# Patient Record
Sex: Female | Born: 1985 | Race: Black or African American | Hispanic: No | Marital: Single | Smoking: Never smoker
Health system: Southern US, Community
[De-identification: ages and names within clinical notes are randomized; demographics above are authoritative.]

## PROBLEM LIST (undated history)

## (undated) ENCOUNTER — Inpatient Hospital Stay (HOSPITAL_COMMUNITY): Payer: Self-pay

## (undated) DIAGNOSIS — N200 Calculus of kidney: Secondary | ICD-10-CM

## (undated) DIAGNOSIS — O09299 Supervision of pregnancy with other poor reproductive or obstetric history, unspecified trimester: Secondary | ICD-10-CM

## (undated) DIAGNOSIS — Z8759 Personal history of other complications of pregnancy, childbirth and the puerperium: Secondary | ICD-10-CM

## (undated) DIAGNOSIS — R51 Headache: Secondary | ICD-10-CM

## (undated) DIAGNOSIS — B999 Unspecified infectious disease: Secondary | ICD-10-CM

## (undated) DIAGNOSIS — R519 Headache, unspecified: Secondary | ICD-10-CM

## (undated) HISTORY — PX: TUBAL LIGATION: SHX77

## (undated) HISTORY — PX: NO PAST SURGERIES: SHX2092

---
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
## 2017-09-20 ENCOUNTER — Inpatient Hospital Stay (HOSPITAL_COMMUNITY)
Admission: AD | Admit: 2017-09-20 | Discharge: 2017-09-20 | Disposition: A | Discharge status: Home / Self Care | Payer: BC Managed Care – PPO | Source: Ambulatory Visit | Attending: Obstetrics & Gynecology | Admitting: Obstetrics & Gynecology

## 2017-09-20 DIAGNOSIS — R109 Unspecified abdominal pain: Secondary | ICD-10-CM | POA: Insufficient documentation

## 2017-09-20 DIAGNOSIS — N946 Dysmenorrhea, unspecified: Secondary | ICD-10-CM | POA: Diagnosis not present

## 2017-09-20 DIAGNOSIS — N939 Abnormal uterine and vaginal bleeding, unspecified: Secondary | ICD-10-CM | POA: Insufficient documentation

## 2017-09-20 LAB — COMPREHENSIVE METABOLIC PANEL
ALT: 17 U/L (ref 14–54)
ANION GAP: 9 (ref 5–15)
AST: 19 U/L (ref 15–41)
Albumin: 4.1 g/dL (ref 3.5–5.0)
Alkaline Phosphatase: 68 U/L (ref 38–126)
BUN: 8 mg/dL (ref 6–20)
CHLORIDE: 106 mmol/L (ref 101–111)
CO2: 22 mmol/L (ref 22–32)
Calcium: 9 mg/dL (ref 8.9–10.3)
Creatinine, Ser: 0.74 mg/dL (ref 0.44–1.00)
Glucose, Bld: 89 mg/dL (ref 65–99)
POTASSIUM: 4 mmol/L (ref 3.5–5.1)
Sodium: 137 mmol/L (ref 135–145)
Total Bilirubin: 0.5 mg/dL (ref 0.3–1.2)
Total Protein: 7.4 g/dL (ref 6.5–8.1)

## 2017-09-20 LAB — CBC
HCT: 37.3 % (ref 36.0–46.0)
Hemoglobin: 12.7 g/dL (ref 12.0–15.0)
MCH: 31.2 pg (ref 26.0–34.0)
MCHC: 34 g/dL (ref 30.0–36.0)
MCV: 91.6 fL (ref 78.0–100.0)
PLATELETS: 292 10*3/uL (ref 150–400)
RBC: 4.07 MIL/uL (ref 3.87–5.11)
RDW: 13.1 % (ref 11.5–15.5)
WBC: 5.3 10*3/uL (ref 4.0–10.5)

## 2017-09-20 LAB — WET PREP, GENITAL
CLUE CELLS WET PREP: NONE SEEN
Sperm: NONE SEEN
Trich, Wet Prep: NONE SEEN
YEAST WET PREP: NONE SEEN

## 2017-09-20 LAB — URINALYSIS, ROUTINE W REFLEX MICROSCOPIC
BACTERIA UA: NONE SEEN
BILIRUBIN URINE: NEGATIVE
GLUCOSE, UA: NEGATIVE mg/dL
KETONES UR: NEGATIVE mg/dL
NITRITE: NEGATIVE
Protein, ur: NEGATIVE mg/dL
RBC / HPF: >50 RBC/hpf — ABNORMAL HIGH (ref 0–5)
Specific Gravity, Urine: 1.012 (ref 1.005–1.030)
WBC, UA: >50 WBC/hpf — ABNORMAL HIGH (ref 0–5)
pH: 6 (ref 5.0–8.0)

## 2017-09-20 LAB — LIPASE, BLOOD: LIPASE: 25 U/L (ref 11–51)

## 2017-09-20 LAB — POCT PREGNANCY, URINE: Preg Test, Ur: NEGATIVE

## 2017-09-20 MED ORDER — KETOROLAC TROMETHAMINE 60 MG/2ML IM SOLN
60.0000 mg | Freq: Once | INTRAMUSCULAR | Status: DC
  Filled 2017-09-20: qty 2

## 2017-09-20 NOTE — MAU Provider Note (Signed)
Chief Complaint: Vaginal Bleeding and Abdominal Pain   None     SUBJECTIVE HPI: Valerie Taylor is a 32 y.o. nonpregnant female who presents to maternity admissions reporting sudden onset of cramping pain yesterday, the day her period started, which resolved, then onset of pain today again, mostly on the left lower side of her abdomen. She has not tried any treatments. She just finished course of Flagyl for BV. There are no other symptoms. She denies constipation, n/v, or fever/chills.     HPI  No past medical history on file.  The histories are not reviewed yet. Please review them in the "History" navigator section and refresh this SmartLink. Social History   Socioeconomic History  . Marital status: Single    Spouse name: Not on file  . Number of children: Not on file  . Years of education: Not on file  . Highest education level: Not on file  Occupational History  . Not on file  Social Needs  . Financial resource strain: Not on file  . Food insecurity:    Worry: Not on file    Inability: Not on file  . Transportation needs:    Medical: Not on file    Non-medical: Not on file  Tobacco Use  . Smoking status: Not on file  Substance and Sexual Activity  . Alcohol use: Not on file  . Drug use: Not on file  . Sexual activity: Not on file  Lifestyle  . Physical activity:    Days per week: Not on file    Minutes per session: Not on file  . Stress: Not on file  Relationships  . Social connections:    Talks on phone: Not on file    Gets together: Not on file    Attends religious service: Not on file    Active member of club or organization: Not on file    Attends meetings of clubs or organizations: Not on file    Relationship status: Not on file  . Intimate partner violence:    Fear of current or ex partner: Not on file    Emotionally abused: Not on file    Physically abused: Not on file    Forced sexual activity: Not on file  Other Topics Concern  . Not on file   Social History Narrative  . Not on file   No current facility-administered medications on file prior to encounter.    No current outpatient medications on file prior to encounter.   Allergies not on file  ROS:  Review of Systems  Constitutional: Negative for chills, fatigue and fever.  Respiratory: Negative for shortness of breath.   Cardiovascular: Negative for chest pain.  Gastrointestinal: Positive for abdominal pain. Negative for nausea and vomiting.  Genitourinary: Positive for pelvic pain and vaginal bleeding. Negative for difficulty urinating, dysuria, flank pain, vaginal discharge and vaginal pain.  Neurological: Negative for dizziness and headaches.  Psychiatric/Behavioral: Negative.      I have reviewed patient's Past Medical Hx, Surgical Hx, Family Hx, Social Hx, medications and allergies.   Physical Exam   Patient Vitals for the past 24 hrs:  BP Temp Temp src Pulse Resp  09/20/17 1208 - 98.6 F (37 C) Oral - 17  09/20/17 1206 108/72 - - 87 -   Constitutional: Well-developed, well-nourished female in no acute distress.  Cardiovascular: normal rate Respiratory: normal effort GI: Abd soft, non-tender. Pos BS x 4 MS: Extremities nontender, no edema, normal ROM Neurologic: Alert and oriented x 4.  GU: Neg CVAT.  PELVIC EXAM: Cervix pink, visually closed, without lesion, small amount dark red bleeding, vaginal walls and external genitalia normal Bimanual exam: Cervix 0/long/high, firm, anterior, neg CMT, uterus nontender, nonenlarged, adnexa without tenderness, enlargement, or mass   LAB RESULTS Results for orders placed or performed during the hospital encounter of 09/20/17 (from the past 24 hour(s))  Urinalysis, Routine w reflex microscopic     Status: Abnormal   Collection Time: 09/20/17 12:20 PM  Result Value Ref Range   Color, Urine YELLOW YELLOW   APPearance CLEAR CLEAR   Specific Gravity, Urine 1.012 1.005 - 1.030   pH 6.0 5.0 - 8.0   Glucose, UA  NEGATIVE NEGATIVE mg/dL   Hgb urine dipstick LARGE (A) NEGATIVE   Bilirubin Urine NEGATIVE NEGATIVE   Ketones, ur NEGATIVE NEGATIVE mg/dL   Protein, ur NEGATIVE NEGATIVE mg/dL   Nitrite NEGATIVE NEGATIVE   Leukocytes, UA TRACE (A) NEGATIVE   RBC / HPF >50 (H) 0 - 5 RBC/hpf   WBC, UA >50 (H) 0 - 5 WBC/hpf   Bacteria, UA NONE SEEN NONE SEEN   Squamous Epithelial / LPF 6-10 0 - 5  Pregnancy, urine POC     Status: None   Collection Time: 09/20/17 12:24 PM  Result Value Ref Range   Preg Test, Ur NEGATIVE NEGATIVE  CBC     Status: None   Collection Time: 09/20/17 12:43 PM  Result Value Ref Range   WBC 5.3 4.0 - 10.5 K/uL   RBC 4.07 3.87 - 5.11 MIL/uL   Hemoglobin 12.7 12.0 - 15.0 g/dL   HCT 40.9 81.1 - 91.4 %   MCV 91.6 78.0 - 100.0 fL   MCH 31.2 26.0 - 34.0 pg   MCHC 34.0 30.0 - 36.0 g/dL   RDW 78.2 95.6 - 21.3 %   Platelets 292 150 - 400 K/uL  Comprehensive metabolic panel     Status: None   Collection Time: 09/20/17 12:43 PM  Result Value Ref Range   Sodium 137 135 - 145 mmol/L   Potassium 4.0 3.5 - 5.1 mmol/L   Chloride 106 101 - 111 mmol/L   CO2 22 22 - 32 mmol/L   Glucose, Bld 89 65 - 99 mg/dL   BUN 8 6 - 20 mg/dL   Creatinine, Ser 0.86 0.44 - 1.00 mg/dL   Calcium 9.0 8.9 - 57.8 mg/dL   Total Protein 7.4 6.5 - 8.1 g/dL   Albumin 4.1 3.5 - 5.0 g/dL   AST 19 15 - 41 U/L   ALT 17 14 - 54 U/L   Alkaline Phosphatase 68 38 - 126 U/L   Total Bilirubin 0.5 0.3 - 1.2 mg/dL   GFR calc non Af Amer >60 >60 mL/min   GFR calc Af Amer >60 >60 mL/min   Anion gap 9 5 - 15  Lipase, blood     Status: None   Collection Time: 09/20/17 12:43 PM  Result Value Ref Range   Lipase 25 11 - 51 U/L  Wet prep, genital     Status: Abnormal   Collection Time: 09/20/17 12:54 PM  Result Value Ref Range   Yeast Wet Prep HPF POC NONE SEEN NONE SEEN   Trich, Wet Prep NONE SEEN NONE SEEN   Clue Cells Wet Prep HPF POC NONE SEEN NONE SEEN   WBC, Wet Prep HPF POC FEW (A) NONE SEEN   Sperm NONE SEEN         IMAGING No results found.  MAU Management/MDM:  CBC, CMP, lipase wnl UA wnl except Hgb, likely from vaginal bleeding. Pelvic exam with pain at upper portion of uterus, none in ovaries. Pain is likely menstrual in nature. No palpable masses today but cannot rule out uterine fibroid.  Pt to f/u with OB/Gyn and given list of providers today.  Toradol 60 mg IM x 1 dose given.  Pt discharged with strict return precautions.  ASSESSMENT 1. Dysmenorrhea     PLAN Discharge home  Allergies as of 09/20/2017   Not on File     Medication List    You have not been prescribed any medications.    Follow-up Information    OB/Gyn of your choice Follow up.   Why:  See list provided          Sharen CounterLisa Leftwich-Kirby Certified Nurse-Midwife 09/20/2017  1:20 PM

## 2017-09-20 NOTE — Discharge Instructions (Signed)
[facility]  ° ° °[facility]       Phone: [phone] ° °[facility] Phone: [phone] ° °[facility]  Phone: [phone] ° °[facility]  Phone: [phone] ° °[facility]  Phone: [phone] ° °[facility]       Phone: [phone] ° °[facility]    Phone: [phone]  ° °[facility] ()    Phone: [phone] ° °[facility]    Phone: [phone] ° °[facility]    Phone: [phone] ° °[facility]    Phone: [phone] ° °[facility]       Phone: [phone]  ° °[facility]  Phone: [phone] ° °[facility]    Phone: [phone] ° °[facility]   Phone: [phone] ° °[facility]      Phone: [phone] ° °[facility]    Phone: [phone] ° °

## 2017-09-20 NOTE — MAU Note (Signed)
Pt c/o increased pain and bleeding with period.  Started on Tuesday and is now worse.

## 2017-12-06 ENCOUNTER — Ambulatory Visit (INDEPENDENT_AMBULATORY_CARE_PROVIDER_SITE_OTHER): Payer: BC Managed Care – PPO | Admitting: Obstetrics and Gynecology

## 2017-12-06 ENCOUNTER — Encounter: Payer: Self-pay | Admitting: Obstetrics and Gynecology

## 2017-12-06 VITALS — BP 114/78 | HR 105 | Ht 64.0 in | Wt 184.0 lb

## 2017-12-06 DIAGNOSIS — N898 Other specified noninflammatory disorders of vagina: Secondary | ICD-10-CM | POA: Diagnosis not present

## 2017-12-06 DIAGNOSIS — Z3490 Encounter for supervision of normal pregnancy, unspecified, unspecified trimester: Secondary | ICD-10-CM | POA: Diagnosis not present

## 2017-12-06 DIAGNOSIS — Z3491 Encounter for supervision of normal pregnancy, unspecified, first trimester: Secondary | ICD-10-CM

## 2017-12-06 DIAGNOSIS — Z113 Encounter for screening for infections with a predominantly sexual mode of transmission: Secondary | ICD-10-CM | POA: Diagnosis not present

## 2017-12-06 DIAGNOSIS — O09299 Supervision of pregnancy with other poor reproductive or obstetric history, unspecified trimester: Secondary | ICD-10-CM

## 2017-12-06 DIAGNOSIS — Z8759 Personal history of other complications of pregnancy, childbirth and the puerperium: Secondary | ICD-10-CM

## 2017-12-06 DIAGNOSIS — O09291 Supervision of pregnancy with other poor reproductive or obstetric history, first trimester: Secondary | ICD-10-CM

## 2017-12-06 HISTORY — DX: Supervision of pregnancy with other poor reproductive or obstetric history, unspecified trimester: O09.299

## 2017-12-06 HISTORY — DX: Personal history of other complications of pregnancy, childbirth and the puerperium: Z87.59

## 2017-12-06 MED ORDER — VITAFOL GUMMIES 3.33-0.333-34.8 MG PO CHEW: 2.0000 | CHEWABLE_TABLET | Freq: Every day | ORAL | 6 refills | Status: AC

## 2017-12-06 NOTE — Progress Notes (Signed)
Pt states she had a ectopic pregnancy in 2004   Pt also noted she delivered at 4 months need to confirm year. Per pt was in [place]  Last pap: per pt in the beginning of the year was normal was done in [place], [place]   C/O: cramping pain. Declines Genetic screening.

## 2017-12-06 NOTE — Progress Notes (Signed)
  Subjective:    Valerie Taylor is a G3P0010 [redacted]w[redacted]d being seen today for her first obstetrical visit.  Her obstetrical history is significant for previous pregnancy loss at 84 months gestational age. Patient is uncertain if she received the diagnosis of incompetent cervix what caused the loss- records requested. Patient does intend to breast feed. Pregnancy history fully reviewed.  Patient reports no complaints.  Vitals:   12/06/17 1421 12/06/17 1426  BP: 114/78   Pulse: (!) 105   Weight: 184 lb (83.5 kg)   Height:  5\' 4"  (1.626 m)    HISTORY: OB History  Gravida Para Term Preterm AB Living  3       1    SAB TAB Ectopic Multiple Live Births      1        # Outcome Date GA Lbr Len/2nd Weight Sex Delivery Anes PTL Lv  3 Current           2 Ectopic           1 Gravida             Obstetric Comments  2004 per pt    History reviewed. No pertinent past medical history. History reviewed. No pertinent surgical history. History reviewed. No pertinent family history.   Exam    Uterus:     Pelvic Exam:    Perineum: No Hemorrhoids, Normal Perineum   Vulva: normal   Vagina:  normal mucosa, normal discharge   pH:    Cervix: multiparous appearance and cervix: closed, thick and long   Adnexa: normal adnexa and no mass, fullness, tenderness   Bony Pelvis: gynecoid  System: Breast:  normal appearance, no masses or tenderness   Skin: normal coloration and turgor, no rashes    Neurologic: oriented, no focal deficits   Extremities: normal strength, tone, and muscle mass   HEENT extra ocular movement intact   Mouth/Teeth mucous membranes moist, pharynx normal without lesions and dental hygiene good   Neck supple and no masses   Cardiovascular: regular rate and rhythm   Respiratory:  chest clear, no wheezing, crepitations, rhonchi, normal symmetric air entry   Abdomen: soft, non-tender; bowel sounds normal; no masses,  no organomegaly   Urinary:       Assessment:    Pregnancy:  G3P0010 Patient Active Problem List   Diagnosis Date Noted  . Supervision of normal pregnancy 12/06/2017  . History of ectopic pregnancy 12/06/2017  . History of pregnancy loss in prior pregnancy, currently pregnant 12/06/2017        Plan:     Initial labs drawn. Prenatal vitamins. Problem list reviewed and updated. Genetic Screening discussed : declined.  Ultrasound discussed; fetal survey: ordered. Patient is considering water birth Patient reports pap smear within the past 12 months in [place]- records requested Records from pregnancy loss requested  Follow up in 4 weeks. 50% of 30 min visit spent on counseling and coordination of care.     Jozeph Persing 12/06/2017

## 2017-12-06 NOTE — Patient Instructions (Signed)
 First Trimester of Pregnancy The first trimester of pregnancy is from week 1 until the end of week 13 (months 1 through 3). A week after a sperm fertilizes an egg, the egg will implant on the wall of the uterus. This embryo will begin to develop into a baby. Genes from you and your partner will form the baby. The female genes will determine whether the baby will be a boy or a girl. At 6-8 weeks, the eyes and face will be formed, and the heartbeat can be seen on ultrasound. At the end of 12 weeks, all the baby's organs will be formed. Now that you are pregnant, you will want to do everything you can to have a healthy baby. Two of the most important things are to get good prenatal care and to follow your health care provider's instructions. Prenatal care is all the medical care you receive before the baby's birth. This care will help prevent, find, and treat any problems during the pregnancy and childbirth. Body changes during your first trimester Your body goes through many changes during pregnancy. The changes vary from woman to woman.  You may gain or lose a couple of pounds at first.  You may feel sick to your stomach (nauseous) and you may throw up (vomit). If the vomiting is uncontrollable, call your health care provider.  You may tire easily.  You may develop headaches that can be relieved by medicines. All medicines should be approved by your health care provider.  You may urinate more often. Painful urination may mean you have a bladder infection.  You may develop heartburn as a result of your pregnancy.  You may develop constipation because certain hormones are causing the muscles that push stool through your intestines to slow down.  You may develop hemorrhoids or swollen veins (varicose veins).  Your breasts may begin to grow larger and become tender. Your nipples may stick out more, and the tissue that surrounds them (areola) may become darker.  Your gums may bleed and may be  sensitive to brushing and flossing.  Dark spots or blotches (chloasma, mask of pregnancy) may develop on your face. This will likely fade after the baby is born.  Your menstrual periods will stop.  You may have a loss of appetite.  You may develop cravings for certain kinds of food.  You may have changes in your emotions from day to day, such as being excited to be pregnant or being concerned that something may go wrong with the pregnancy and baby.  You may have more vivid and strange dreams.  You may have changes in your hair. These can include thickening of your hair, rapid growth, and changes in texture. Some women also have hair loss during or after pregnancy, or hair that feels dry or thin. Your hair will most likely return to normal after your baby is born.  What to expect at prenatal visits During a routine prenatal visit:  You will be weighed to make sure you and the baby are growing normally.  Your blood pressure will be taken.  Your abdomen will be measured to track your baby's growth.  The fetal heartbeat will be listened to between weeks 10 and 14 of your pregnancy.  Test results from any previous visits will be discussed.  Your health care provider may ask you:  How you are feeling.  If you are feeling the baby move.  If you have had any abnormal symptoms, such as leaking fluid, bleeding, severe   headaches, or abdominal cramping.  If you are using any tobacco products, including cigarettes, chewing tobacco, and electronic cigarettes.  If you have any questions.  Other tests that may be performed during your first trimester include:  Blood tests to find your blood type and to check for the presence of any previous infections. The tests will also be used to check for low iron levels (anemia) and protein on red blood cells (Rh antibodies). Depending on your risk factors, or if you previously had diabetes during pregnancy, you may have tests to check for high blood  sugar that affects pregnant women (gestational diabetes).  Urine tests to check for infections, diabetes, or protein in the urine.  An ultrasound to confirm the proper growth and development of the baby.  Fetal screens for spinal cord problems (spina bifida) and Down syndrome.  HIV (human immunodeficiency virus) testing. Routine prenatal testing includes screening for HIV, unless you choose not to have this test.  You may need other tests to make sure you and the baby are doing well.  Follow these instructions at home: Medicines  Follow your health care provider's instructions regarding medicine use. Specific medicines may be either safe or unsafe to take during pregnancy.  Take a prenatal vitamin that contains at least 600 micrograms (mcg) of folic acid.  If you develop constipation, try taking a stool softener if your health care provider approves. Eating and drinking  Eat a balanced diet that includes fresh fruits and vegetables, whole grains, good sources of protein such as meat, eggs, or tofu, and low-fat dairy. Your health care provider will help you determine the amount of weight gain that is right for you.  Avoid raw meat and uncooked cheese. These carry germs that can cause birth defects in the baby.  Eating four or five small meals rather than three large meals a day may help relieve nausea and vomiting. If you start to feel nauseous, eating a few soda crackers can be helpful. Drinking liquids between meals, instead of during meals, also seems to help ease nausea and vomiting.  Limit foods that are high in fat and processed sugars, such as fried and sweet foods.  To prevent constipation: ? Eat foods that are high in fiber, such as fresh fruits and vegetables, whole grains, and beans. ? Drink enough fluid to keep your urine clear or pale yellow. Activity  Exercise only as directed by your health care provider. Most women can continue their usual exercise routine during  pregnancy. Try to exercise for 30 minutes at least 5 days a week. Exercising will help you: ? Control your weight. ? Stay in shape. ? Be prepared for labor and delivery.  Experiencing pain or cramping in the lower abdomen or lower back is a good sign that you should stop exercising. Check with your health care provider before continuing with normal exercises.  Try to avoid standing for long periods of time. Move your legs often if you must stand in one place for a long time.  Avoid heavy lifting.  Wear low-heeled shoes and practice good posture.  You may continue to have sex unless your health care provider tells you not to. Relieving pain and discomfort  Wear a good support bra to relieve breast tenderness.  Take warm sitz baths to soothe any pain or discomfort caused by hemorrhoids. Use hemorrhoid cream if your health care provider approves.  Rest with your legs elevated if you have leg cramps or low back pain.  If you   develop varicose veins in your legs, wear support hose. Elevate your feet for 15 minutes, 3-4 times a day. Limit salt in your diet. Prenatal care  Schedule your prenatal visits by the twelfth week of pregnancy. They are usually scheduled monthly at first, then more often in the last 2 months before delivery.  Write down your questions. Take them to your prenatal visits.  Keep all your prenatal visits as told by your health care provider. This is important. Safety  Wear your seat belt at all times when driving.  Make a list of emergency phone numbers, including numbers for family, friends, the hospital, and police and fire departments. General instructions  Ask your health care provider for a referral to a local prenatal education class. Begin classes no later than the beginning of month 6 of your pregnancy.  Ask for help if you have counseling or nutritional needs during pregnancy. Your health care provider can offer advice or refer you to specialists for help  with various needs.  Do not use hot tubs, steam rooms, or saunas.  Do not douche or use tampons or scented sanitary pads.  Do not cross your legs for long periods of time.  Avoid cat litter boxes and soil used by cats. These carry germs that can cause birth defects in the baby and possibly loss of the fetus by miscarriage or stillbirth.  Avoid all smoking, herbs, alcohol, and medicines not prescribed by your health care provider. Chemicals in these products affect the formation and growth of the baby.  Do not use any products that contain nicotine or tobacco, such as cigarettes and e-cigarettes. If you need help quitting, ask your health care provider. You may receive counseling support and other resources to help you quit.  Schedule a dentist appointment. At home, brush your teeth with a soft toothbrush and be gentle when you floss. Contact a health care provider if:  You have dizziness.  You have mild pelvic cramps, pelvic pressure, or nagging pain in the abdominal area.  You have persistent nausea, vomiting, or diarrhea.  You have a bad smelling vaginal discharge.  You have pain when you urinate.  You notice increased swelling in your face, hands, legs, or ankles.  You are exposed to fifth disease or chickenpox.  You are exposed to German measles (rubella) and have never had it. Get help right away if:  You have a fever.  You are leaking fluid from your vagina.  You have spotting or bleeding from your vagina.  You have severe abdominal cramping or pain.  You have rapid weight gain or loss.  You vomit blood or material that looks like coffee grounds.  You develop a severe headache.  You have shortness of breath.  You have any kind of trauma, such as from a fall or a car accident. Summary  The first trimester of pregnancy is from week 1 until the end of week 13 (months 1 through 3).  Your body goes through many changes during pregnancy. The changes vary from  woman to woman.  You will have routine prenatal visits. During those visits, your health care provider will examine you, discuss any test results you may have, and talk with you about how you are feeling. This information is not intended to replace advice given to you by your health care provider. Make sure you discuss any questions you have with your health care provider. Document Released: 03/14/2001 Document Revised: 03/01/2016 Document Reviewed: 03/01/2016 [employer] Interactive Patient Education  2018   [employer]   Second Trimester of Pregnancy The second trimester is from week 14 through week 27 (months 4 through 6). The second trimester is often a time when you feel your best. Your body has adjusted to being pregnant, and you begin to feel better physically. Usually, morning sickness has lessened or quit completely, you may have more energy, and you may have an increase in appetite. The second trimester is also a time when the fetus is growing rapidly. At the end of the sixth month, the fetus is about 9 inches long and weighs about 1 pounds. You will likely begin to feel the baby move (quickening) between 16 and 20 weeks of pregnancy. Body changes during your second trimester Your body continues to go through many changes during your second trimester. The changes vary from woman to woman.  Your weight will continue to increase. You will notice your lower abdomen bulging out.  You may begin to get stretch marks on your hips, abdomen, and breasts.  You may develop headaches that can be relieved by medicines. The medicines should be approved by your health care provider.  You may urinate more often because the fetus is pressing on your bladder.  You may develop or continue to have heartburn as a result of your pregnancy.  You may develop constipation because certain hormones are causing the muscles that push waste through your intestines to slow down.  You may develop hemorrhoids or  swollen, bulging veins (varicose veins).  You may have back pain. This is caused by: ? Weight gain. ? Pregnancy hormones that are relaxing the joints in your pelvis. ? A shift in weight and the muscles that support your balance.  Your breasts will continue to grow and they will continue to become tender.  Your gums may bleed and may be sensitive to brushing and flossing.  Dark spots or blotches (chloasma, mask of pregnancy) may develop on your face. This will likely fade after the baby is born.  A dark line from your belly button to the pubic area (linea nigra) may appear. This will likely fade after the baby is born.  You may have changes in your hair. These can include thickening of your hair, rapid growth, and changes in texture. Some women also have hair loss during or after pregnancy, or hair that feels dry or thin. Your hair will most likely return to normal after your baby is born.  What to expect at prenatal visits During a routine prenatal visit:  You will be weighed to make sure you and the fetus are growing normally.  Your blood pressure will be taken.  Your abdomen will be measured to track your baby's growth.  The fetal heartbeat will be listened to.  Any test results from the previous visit will be discussed.  Your health care provider may ask you:  How you are feeling.  If you are feeling the baby move.  If you have had any abnormal symptoms, such as leaking fluid, bleeding, severe headaches, or abdominal cramping.  If you are using any tobacco products, including cigarettes, chewing tobacco, and electronic cigarettes.  If you have any questions.  Other tests that may be performed during your second trimester include:  Blood tests that check for: ? Low iron levels (anemia). ? High blood sugar that affects pregnant women (gestational diabetes) between 24 and 28 weeks. ? Rh antibodies. This is to check for a protein on red blood cells (Rh factor).  Urine  tests to   check for infections, diabetes, or protein in the urine.  An ultrasound to confirm the proper growth and development of the baby.  An amniocentesis to check for possible genetic problems.  Fetal screens for spina bifida and Down syndrome.  HIV (human immunodeficiency virus) testing. Routine prenatal testing includes screening for HIV, unless you choose not to have this test.  Follow these instructions at home: Medicines  Follow your health care provider's instructions regarding medicine use. Specific medicines may be either safe or unsafe to take during pregnancy.  Take a prenatal vitamin that contains at least 600 micrograms (mcg) of folic acid.  If you develop constipation, try taking a stool softener if your health care provider approves. Eating and drinking  Eat a balanced diet that includes fresh fruits and vegetables, whole grains, good sources of protein such as meat, eggs, or tofu, and low-fat dairy. Your health care provider will help you determine the amount of weight gain that is right for you.  Avoid raw meat and uncooked cheese. These carry germs that can cause birth defects in the baby.  If you have low calcium intake from food, talk to your health care provider about whether you should take a daily calcium supplement.  Limit foods that are high in fat and processed sugars, such as fried and sweet foods.  To prevent constipation: ? Drink enough fluid to keep your urine clear or pale yellow. ? Eat foods that are high in fiber, such as fresh fruits and vegetables, whole grains, and beans. Activity  Exercise only as directed by your health care provider. Most women can continue their usual exercise routine during pregnancy. Try to exercise for 30 minutes at least 5 days a week. Stop exercising if you experience uterine contractions.  Avoid heavy lifting, wear low heel shoes, and practice good posture.  A sexual relationship may be continued unless your health  care provider directs you otherwise. Relieving pain and discomfort  Wear a good support bra to prevent discomfort from breast tenderness.  Take warm sitz baths to soothe any pain or discomfort caused by hemorrhoids. Use hemorrhoid cream if your health care provider approves.  Rest with your legs elevated if you have leg cramps or low back pain.  If you develop varicose veins, wear support hose. Elevate your feet for 15 minutes, 3-4 times a day. Limit salt in your diet. Prenatal Care  Write down your questions. Take them to your prenatal visits.  Keep all your prenatal visits as told by your health care provider. This is important. Safety  Wear your seat belt at all times when driving.  Make a list of emergency phone numbers, including numbers for family, friends, the hospital, and police and fire departments. General instructions  Ask your health care provider for a referral to a local prenatal education class. Begin classes no later than the beginning of month 6 of your pregnancy.  Ask for help if you have counseling or nutritional needs during pregnancy. Your health care provider can offer advice or refer you to specialists for help with various needs.  Do not use hot tubs, steam rooms, or saunas.  Do not douche or use tampons or scented sanitary pads.  Do not cross your legs for long periods of time.  Avoid cat litter boxes and soil used by cats. These carry germs that can cause birth defects in the baby and possibly loss of the fetus by miscarriage or stillbirth.  Avoid all smoking, herbs, alcohol, and unprescribed drugs. Chemicals   in these products can affect the formation and growth of the baby.  Do not use any products that contain nicotine or tobacco, such as cigarettes and e-cigarettes. If you need help quitting, ask your health care provider.  Visit your dentist if you have not gone yet during your pregnancy. Use a soft toothbrush to brush your teeth and be gentle when  you floss. Contact a health care provider if:  You have dizziness.  You have mild pelvic cramps, pelvic pressure, or nagging pain in the abdominal area.  You have persistent nausea, vomiting, or diarrhea.  You have a bad smelling vaginal discharge.  You have pain when you urinate. Get help right away if:  You have a fever.  You are leaking fluid from your vagina.  You have spotting or bleeding from your vagina.  You have severe abdominal cramping or pain.  You have rapid weight gain or weight loss.  You have shortness of breath with chest pain.  You notice sudden or extreme swelling of your face, hands, ankles, feet, or legs.  You have not felt your baby move in over an hour.  You have severe headaches that do not go away when you take medicine.  You have vision changes. Summary  The second trimester is from week 14 through week 27 (months 4 through 6). It is also a time when the fetus is growing rapidly.  Your body goes through many changes during pregnancy. The changes vary from woman to woman.  Avoid all smoking, herbs, alcohol, and unprescribed drugs. These chemicals affect the formation and growth your baby.  Do not use any tobacco products, such as cigarettes, chewing tobacco, and e-cigarettes. If you need help quitting, ask your health care provider.  Contact your health care provider if you have any questions. Keep all prenatal visits as told by your health care provider. This is important. This information is not intended to replace advice given to you by your health care provider. Make sure you discuss any questions you have with your health care provider. Document Released: 03/14/2001 Document Revised: 04/25/2016 Document Reviewed: 04/25/2016 [employer] Interactive Patient Education  2018 [employer]   Contraception Choices Contraception, also called birth control, refers to methods or devices that prevent pregnancy. Hormonal methods Contraceptive  implant A contraceptive implant is a thin, plastic tube that contains a hormone. It is inserted into the upper part of the arm. It can remain in place for up to 3 years. Progestin-only injections Progestin-only injections are injections of progestin, a synthetic form of the hormone progesterone. They are given every 3 months by a health care provider. Birth control pills Birth control pills are pills that contain hormones that prevent pregnancy. They must be taken once a day, preferably at the same time each day. Birth control patch The birth control patch contains hormones that prevent pregnancy. It is placed on the skin and must be changed once a week for three weeks and removed on the fourth week. A prescription is needed to use this method of contraception. Vaginal ring A vaginal ring contains hormones that prevent pregnancy. It is placed in the vagina for three weeks and removed on the fourth week. After that, the process is repeated with a new ring. A prescription is needed to use this method of contraception. Emergency contraceptive Emergency contraceptives prevent pregnancy after unprotected sex. They come in pill form and can be taken up to 5 days after sex. They work best the sooner they are taken after having   sex. Most emergency contraceptives are available without a prescription. This method should not be used as your only form of birth control. Barrier methods Female condom A female condom is a thin sheath that is worn over the penis during sex. Condoms keep sperm from going inside a woman's body. They can be used with a spermicide to increase their effectiveness. They should be disposed after a single use. Female condom A female condom is a soft, loose-fitting sheath that is put into the vagina before sex. The condom keeps sperm from going inside a woman's body. They should be disposed after a single use. Diaphragm A diaphragm is a soft, dome-shaped barrier. It is inserted into the vagina  before sex, along with a spermicide. The diaphragm blocks sperm from entering the uterus, and the spermicide kills sperm. A diaphragm should be left in the vagina for 6-8 hours after sex and removed within 24 hours. A diaphragm is prescribed and fitted by a health care provider. A diaphragm should be replaced every 1-2 years, after giving birth, after gaining more than 15 lb (6.8 kg), and after pelvic surgery. Cervical cap A cervical cap is a round, soft latex or plastic cup that fits over the cervix. It is inserted into the vagina before sex, along with spermicide. It blocks sperm from entering the uterus. The cap should be left in place for 6-8 hours after sex and removed within 48 hours. A cervical cap must be prescribed and fitted by a health care provider. It should be replaced every 2 years. Sponge A sponge is a soft, circular piece of polyurethane foam with spermicide on it. The sponge helps block sperm from entering the uterus, and the spermicide kills sperm. To use it, you make it wet and then insert it into the vagina. It should be inserted before sex, left in for at least 6 hours after sex, and removed and thrown away within 30 hours. Spermicides Spermicides are chemicals that kill or block sperm from entering the cervix and uterus. They can come as a cream, jelly, suppository, foam, or tablet. A spermicide should be inserted into the vagina with an applicator at least 10-15 minutes before sex to allow time for it to work. The process must be repeated every time you have sex. Spermicides do not require a prescription. Intrauterine contraception Intrauterine device (IUD) An IUD is a T-shaped device that is put in a woman's uterus. There are two types:  Hormone IUD.This type contains progestin, a synthetic form of the hormone progesterone. This type can stay in place for 3-5 years.  Copper IUD.This type is wrapped in copper wire. It can stay in place for 10 years.  Permanent methods of  contraception Female tubal ligation In this method, a woman's fallopian tubes are sealed, tied, or blocked during surgery to prevent eggs from traveling to the uterus. Hysteroscopic sterilization In this method, a small, flexible insert is placed into each fallopian tube. The inserts cause scar tissue to form in the fallopian tubes and block them, so sperm cannot reach an egg. The procedure takes about 3 months to be effective. Another form of birth control must be used during those 3 months. Female sterilization This is a procedure to tie off the tubes that carry sperm (vasectomy). After the procedure, the man can still ejaculate fluid (semen). Natural planning methods Natural family planning In this method, a couple does not have sex on days when the woman could become pregnant. Calendar method This means keeping track of the   length of each menstrual cycle, identifying the days when pregnancy can happen, and not having sex on those days. Ovulation method In this method, a couple avoids sex during ovulation. Symptothermal method This method involves not having sex during ovulation. The woman typically checks for ovulation by watching changes in her temperature and in the consistency of cervical mucus. Post-ovulation method In this method, a couple waits to have sex until after ovulation. Summary  Contraception, also called birth control, means methods or devices that prevent pregnancy.  Hormonal methods of contraception include implants, injections, pills, patches, vaginal rings, and emergency contraceptives.  Barrier methods of contraception can include female condoms, female condoms, diaphragms, cervical caps, sponges, and spermicides.  There are two types of IUDs (intrauterine devices). An IUD can be put in a woman's uterus to prevent pregnancy for 3-5 years.  Permanent sterilization can be done through a procedure for males, females, or both.  Natural family planning methods involve  not having sex on days when the woman could become pregnant. This information is not intended to replace advice given to you by your health care provider. Make sure you discuss any questions you have with your health care provider. Document Released: 03/20/2005 Document Revised: 04/22/2016 Document Reviewed: 04/22/2016 Elsevier Interactive Patient Education  2018 [employer]   Breastfeeding Choosing to breastfeed is one of the best decisions you can make for yourself and your baby. A change in hormones during pregnancy causes your breasts to make breast milk in your milk-producing glands. Hormones prevent breast milk from being released before your baby is born. They also prompt milk flow after birth. Once breastfeeding has begun, thoughts of your baby, as well as his or her sucking or crying, can stimulate the release of milk from your milk-producing glands. Benefits of breastfeeding Research shows that breastfeeding offers many health benefits for infants and mothers. It also offers a cost-free and convenient way to feed your baby. For your baby  Your first milk (colostrum) helps your baby's digestive system to function better.  Special cells in your milk (antibodies) help your baby to fight off infections.  Breastfed babies are less likely to develop asthma, allergies, obesity, or type 2 diabetes. They are also at lower risk for sudden infant death syndrome (SIDS).  Nutrients in breast milk are better able to meet your baby's needs compared to infant formula.  Breast milk improves your baby's brain development. For you  Breastfeeding helps to create a very special bond between you and your baby.  Breastfeeding is convenient. Breast milk costs nothing and is always available at the correct temperature.  Breastfeeding helps to burn calories. It helps you to lose the weight that you gained during pregnancy.  Breastfeeding makes your uterus return faster to its size before pregnancy.  It also slows bleeding (lochia) after you give birth.  Breastfeeding helps to lower your risk of developing type 2 diabetes, osteoporosis, rheumatoid arthritis, cardiovascular disease, and breast, ovarian, uterine, and endometrial cancer later in life. Breastfeeding basics Starting breastfeeding  Find a comfortable place to sit or lie down, with your neck and back well-supported.  Place a pillow or a rolled-up blanket under your baby to bring him or her to the level of your breast (if you are seated). Nursing pillows are specially designed to help support your arms and your baby while you breastfeed.  Make sure that your baby's tummy (abdomen) is facing your abdomen.  Gently massage your breast. With your fingertips, massage from the outer edges of   your breast inward toward the nipple. This encourages milk flow. If your milk flows slowly, you may need to continue this action during the feeding.  Support your breast with 4 fingers underneath and your thumb above your nipple (make the letter "C" with your hand). Make sure your fingers are well away from your nipple and your baby's mouth.  Stroke your baby's lips gently with your finger or nipple.  When your baby's mouth is open wide enough, quickly bring your baby to your breast, placing your entire nipple and as much of the areola as possible into your baby's mouth. The areola is the colored area around your nipple. ? More areola should be visible above your baby's upper lip than below the lower lip. ? Your baby's lips should be opened and extended outward (flanged) to ensure an adequate, comfortable latch. ? Your baby's tongue should be between his or her lower gum and your breast.  Make sure that your baby's mouth is correctly positioned around your nipple (latched). Your baby's lips should create a seal on your breast and be turned out (everted).  It is common for your baby to suck about 2-3 minutes in order to start the flow of breast  milk. Latching Teaching your baby how to latch onto your breast properly is very important. An improper latch can cause nipple pain, decreased milk supply, and poor weight gain in your baby. Also, if your baby is not latched onto your nipple properly, he or she may swallow some air during feeding. This can make your baby fussy. Burping your baby when you switch breasts during the feeding can help to get rid of the air. However, teaching your baby to latch on properly is still the best way to prevent fussiness from swallowing air while breastfeeding. Signs that your baby has successfully latched onto your nipple  Silent tugging or silent sucking, without causing you pain. Infant's lips should be extended outward (flanged).  Swallowing heard between every 3-4 sucks once your milk has started to flow (after your let-down milk reflex occurs).  Muscle movement above and in front of his or her ears while sucking.  Signs that your baby has not successfully latched onto your nipple  Sucking sounds or smacking sounds from your baby while breastfeeding.  Nipple pain.  If you think your baby has not latched on correctly, slip your finger into the corner of your baby's mouth to break the suction and place it between your baby's gums. Attempt to start breastfeeding again. Signs of successful breastfeeding Signs from your baby  Your baby will gradually decrease the number of sucks or will completely stop sucking.  Your baby will fall asleep.  Your baby's body will relax.  Your baby will retain a small amount of milk in his or her mouth.  Your baby will let go of your breast by himself or herself.  Signs from you  Breasts that have increased in firmness, weight, and size 1-3 hours after feeding.  Breasts that are softer immediately after breastfeeding.  Increased milk volume, as well as a change in milk consistency and color by the fifth day of breastfeeding.  Nipples that are not sore,  cracked, or bleeding.  Signs that your baby is getting enough milk  Wetting at least 1-2 diapers during the first 24 hours after birth.  Wetting at least 5-6 diapers every 24 hours for the first week after birth. The urine should be clear or pale yellow by the age of 5   days.  Wetting 6-8 diapers every 24 hours as your baby continues to grow and develop.  At least 3 stools in a 24-hour period by the age of 5 days. The stool should be soft and yellow.  At least 3 stools in a 24-hour period by the age of 7 days. The stool should be seedy and yellow.  No loss of weight greater than 10% of birth weight during the first 3 days of life.  Average weight gain of 4-7 oz (113-198 g) per week after the age of 4 days.  Consistent daily weight gain by the age of 5 days, without weight loss after the age of 2 weeks. After a feeding, your baby may spit up a small amount of milk. This is normal. Breastfeeding frequency and duration Frequent feeding will help you make more milk and can prevent sore nipples and extremely full breasts (breast engorgement). Breastfeed when you feel the need to reduce the fullness of your breasts or when your baby shows signs of hunger. This is called "breastfeeding on demand." Signs that your baby is hungry include:  Increased alertness, activity, or restlessness.  Movement of the head from side to side.  Opening of the mouth when the corner of the mouth or cheek is stroked (rooting).  Increased sucking sounds, smacking lips, cooing, sighing, or squeaking.  Hand-to-mouth movements and sucking on fingers or hands.  Fussing or crying.  Avoid introducing a pacifier to your baby in the first 4-6 weeks after your baby is born. After this time, you may choose to use a pacifier. Research has shown that pacifier use during the first year of a baby's life decreases the risk of sudden infant death syndrome (SIDS). Allow your baby to feed on each breast as long as he or she  wants. When your baby unlatches or falls asleep while feeding from the first breast, offer the second breast. Because newborns are often sleepy in the first few weeks of life, you may need to awaken your baby to get him or her to feed. Breastfeeding times will vary from baby to baby. However, the following rules can serve as a guide to help you make sure that your baby is properly fed:  Newborns (babies 4 weeks of age or younger) may breastfeed every 1-3 hours.  Newborns should not go without breastfeeding for longer than 3 hours during the day or 5 hours during the night.  You should breastfeed your baby a minimum of 8 times in a 24-hour period.  Breast milk pumping Pumping and storing breast milk allows you to make sure that your baby is exclusively fed your breast milk, even at times when you are unable to breastfeed. This is especially important if you go back to work while you are still breastfeeding, or if you are not able to be present during feedings. Your lactation consultant can help you find a method of pumping that works best for you and give you guidelines about how long it is safe to store breast milk. Caring for your breasts while you breastfeed Nipples can become dry, cracked, and sore while breastfeeding. The following recommendations can help keep your breasts moisturized and healthy:  Avoid using soap on your nipples.  Wear a supportive bra designed especially for nursing. Avoid wearing underwire-style bras or extremely tight bras (sports bras).  Air-dry your nipples for 3-4 minutes after each feeding.  Use only cotton bra pads to absorb leaked breast milk. Leaking of breast milk between feedings is normal.    Use lanolin on your nipples after breastfeeding. Lanolin helps to maintain your skin's normal moisture barrier. Pure lanolin is not harmful (not toxic) to your baby. You may also hand express a few drops of breast milk and gently massage that milk into your nipples and  allow the milk to air-dry.  In the first few weeks after giving birth, some women experience breast engorgement. Engorgement can make your breasts feel heavy, warm, and tender to the touch. Engorgement peaks within 3-5 days after you give birth. The following recommendations can help to ease engorgement:  Completely empty your breasts while breastfeeding or pumping. You may want to start by applying warm, moist heat (in the shower or with warm, water-soaked hand towels) just before feeding or pumping. This increases circulation and helps the milk flow. If your baby does not completely empty your breasts while breastfeeding, pump any extra milk after he or she is finished.  Apply ice packs to your breasts immediately after breastfeeding or pumping, unless this is too uncomfortable for you. To do this: ? Put ice in a plastic bag. ? Place a towel between your skin and the bag. ? Leave the ice on for 20 minutes, 2-3 times a day.  Make sure that your baby is latched on and positioned properly while breastfeeding.  If engorgement persists after 48 hours of following these recommendations, contact your health care provider or a lactation consultant. Overall health care recommendations while breastfeeding  Eat 3 healthy meals and 3 snacks every day. Well-nourished mothers who are breastfeeding need an additional 450-500 calories a day. You can meet this requirement by increasing the amount of a balanced diet that you eat.  Drink enough water to keep your urine pale yellow or clear.  Rest often, relax, and continue to take your prenatal vitamins to prevent fatigue, stress, and low vitamin and mineral levels in your body (nutrient deficiencies).  Do not use any products that contain nicotine or tobacco, such as cigarettes and e-cigarettes. Your baby may be harmed by chemicals from cigarettes that pass into breast milk and exposure to secondhand smoke. If you need help quitting, ask your health care  provider.  Avoid alcohol.  Do not use illegal drugs or marijuana.  Talk with your health care provider before taking any medicines. These include over-the-counter and prescription medicines as well as vitamins and herbal supplements. Some medicines that may be harmful to your baby can pass through breast milk.  It is possible to become pregnant while breastfeeding. If birth control is desired, ask your health care provider about options that will be safe while breastfeeding your baby. Where to find more information: [employer]: www.llli.org Contact a health care provider if:  You feel like you want to stop breastfeeding or have become frustrated with breastfeeding.  Your nipples are cracked or bleeding.  Your breasts are red, tender, or warm.  You have: ? Painful breasts or nipples. ? A swollen area on either breast. ? A fever or chills. ? Nausea or vomiting. ? Drainage other than breast milk from your nipples.  Your breasts do not become full before feedings by the fifth day after you give birth.  You feel sad and depressed.  Your baby is: ? Too sleepy to eat well. ? Having trouble sleeping. ? More than 1 week old and wetting fewer than 6 diapers in a 24-hour period. ? Not gaining weight by 5 days of age.  Your baby has fewer than 3 stools in a   24-hour period.  Your baby's skin or the white parts of his or her eyes become yellow. Get help right away if:  Your baby is overly tired (lethargic) and does not want to wake up and feed.  Your baby develops an unexplained fever. Summary  Breastfeeding offers many health benefits for infant and mothers.  Try to breastfeed your infant when he or she shows early signs of hunger.  Gently tickle or stroke your baby's lips with your finger or nipple to allow the baby to open his or her mouth. Bring the baby to your breast. Make sure that much of the areola is in your baby's mouth. Offer one side and burp the  baby before you offer the other side.  Talk with your health care provider or lactation consultant if you have questions or you face problems as you breastfeed. This information is not intended to replace advice given to you by your health care provider. Make sure you discuss any questions you have with your health care provider. Document Released: 03/20/2005 Document Revised: 04/21/2016 Document Reviewed: 04/21/2016 Elsevier Interactive Patient Education  2018 [employer] Inc.  

## 2017-12-07 LAB — CERVICOVAGINAL ANCILLARY ONLY
Bacterial vaginitis: NEGATIVE
CANDIDA VAGINITIS: POSITIVE — AB
Chlamydia: NEGATIVE
NEISSERIA GONORRHEA: NEGATIVE
TRICH (WINDOWPATH): NEGATIVE

## 2017-12-07 MED ORDER — TERCONAZOLE 0.8 % VA CREA: 1.0000 | TOPICAL_CREAM | Freq: Every day | VAGINAL | 0 refills | Status: DC

## 2017-12-07 NOTE — Addendum Note (Signed)
Addended by: Catalina Antigua on: 12/07/2017 07:46 PM   Modules accepted: Orders

## 2017-12-09 LAB — URINE CULTURE, OB REFLEX

## 2017-12-09 LAB — CULTURE, OB URINE

## 2017-12-10 ENCOUNTER — Encounter: Payer: Self-pay | Admitting: Obstetrics & Gynecology

## 2017-12-10 LAB — OBSTETRIC PANEL, INCLUDING HIV
Antibody Screen: NEGATIVE
BASOS ABS: 0 10*3/uL (ref 0.0–0.2)
BASOS: 0 %
EOS (ABSOLUTE): 0 10*3/uL (ref 0.0–0.4)
EOS: 0 %
HEMATOCRIT: 31.9 % — AB (ref 34.0–46.6)
HEMOGLOBIN: 11.1 g/dL (ref 11.1–15.9)
HEP B S AG: NEGATIVE
HIV SCREEN 4TH GENERATION: NONREACTIVE
Immature Grans (Abs): 0.1 10*3/uL (ref 0.0–0.1)
Immature Granulocytes: 1 %
LYMPHS ABS: 2.1 10*3/uL (ref 0.7–3.1)
Lymphs: 21 %
MCH: 31.3 pg (ref 26.6–33.0)
MCHC: 34.8 g/dL (ref 31.5–35.7)
MCV: 90 fL (ref 79–97)
MONOCYTES: 10 %
Monocytes Absolute: 1 10*3/uL — ABNORMAL HIGH (ref 0.1–0.9)
NEUTROS ABS: 7 10*3/uL (ref 1.4–7.0)
Neutrophils: 68 %
Platelets: 303 10*3/uL (ref 150–450)
RBC: 3.55 x10E6/uL — ABNORMAL LOW (ref 3.77–5.28)
RDW: 13.3 % (ref 12.3–15.4)
RH TYPE: POSITIVE
RPR: NONREACTIVE
RUBELLA: 5.8 {index} (ref >0.99)
WBC: 10.2 10*3/uL (ref 3.4–10.8)

## 2017-12-10 LAB — HEMOGLOBINOPATHY EVALUATION
HEMOGLOBIN A2 QUANTITATION: 2 % (ref 1.8–3.2)
HGB A: 98 % (ref 96.4–98.8)
HGB C: 0 %
HGB S: 0 %
HGB VARIANT: 0 %
Hemoglobin F Quantitation: 0 % (ref 0.0–2.0)

## 2017-12-13 LAB — CYSTIC FIBROSIS MUTATION 97: Interpretation: NOT DETECTED

## 2017-12-17 LAB — SMN1 COPY NUMBER ANALYSIS (SMA CARRIER SCREENING)

## 2018-01-01 ENCOUNTER — Encounter: Payer: Self-pay | Admitting: *Deleted

## 2018-01-03 ENCOUNTER — Encounter: Payer: Self-pay | Admitting: Obstetrics and Gynecology

## 2018-01-03 ENCOUNTER — Ambulatory Visit (INDEPENDENT_AMBULATORY_CARE_PROVIDER_SITE_OTHER): Payer: BC Managed Care – PPO | Admitting: Obstetrics and Gynecology

## 2018-01-03 DIAGNOSIS — Z3490 Encounter for supervision of normal pregnancy, unspecified, unspecified trimester: Secondary | ICD-10-CM

## 2018-01-03 NOTE — Progress Notes (Signed)
   PRENATAL VISIT NOTE  Subjective:  Valerie Taylor is a 32 y.o. G3P0020 at [redacted]w[redacted]d being seen today for ongoing prenatal care.  She is currently monitored for the following issues for this low-risk pregnancy and has Supervision of normal pregnancy; History of ectopic pregnancy; and History of pregnancy loss in prior pregnancy, currently pregnant on their problem list.  Patient reports fatigue.  Contractions: Irritability. Vag. Bleeding: None.   . Denies leaking of fluid.   The following portions of the patient's history were reviewed and updated as appropriate: allergies, current medications, past family history, past medical history, past social history, past surgical history and problem list. Problem list updated.  Objective:   Vitals:   01/03/18 0852  BP: 119/75  Pulse: 91  Weight: 188 lb (85.3 kg)    Fetal Status: Fetal Heart Rate (bpm): 150         General:  Alert, oriented and cooperative. Patient is in no acute distress.  Skin: Skin is warm and dry. No rash noted.   Cardiovascular: Normal heart rate noted  Respiratory: Normal respiratory effort, no problems with respiration noted  Abdomen: Soft, gravid, appropriate for gestational age.  Pain/Pressure: Present     Pelvic: Cervical exam deferred        Extremities: Normal range of motion.  Edema: None  Mental Status: Normal mood and affect. Normal behavior. Normal judgment and thought content.   Assessment and Plan:  Pregnancy: G3P0020 at [redacted]w[redacted]d  1. 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 Please refer to After Visit Summary for other counseling recommendations.  Return in about 4 weeks  (around 01/31/2018) for OB visit.  Future Appointments  Date Time Provider Department Center  01/30/2018  9:30 AM [facility] [place] [facility]    Conan Bowens, MD

## 2018-01-03 NOTE — Progress Notes (Signed)
Pt is here for ROB. G3P0 [redacted]w[redacted]d.

## 2018-01-07 LAB — QUANTIFERON-TB GOLD PLUS
QUANTIFERON NIL VALUE: 0.05 [IU]/mL
QUANTIFERON TB1 AG VALUE: 0.1 [IU]/mL
QUANTIFERON TB2 AG VALUE: 0.07 [IU]/mL
QUANTIFERON-TB GOLD PLUS: NEGATIVE
QuantiFERON Mitogen Value: >10 IU/mL

## 2018-01-15 ENCOUNTER — Ambulatory Visit (INDEPENDENT_AMBULATORY_CARE_PROVIDER_SITE_OTHER): Payer: BC Managed Care – PPO | Admitting: Obstetrics and Gynecology

## 2018-01-15 ENCOUNTER — Encounter: Payer: Self-pay | Admitting: Obstetrics and Gynecology

## 2018-01-15 VITALS — BP 105/69 | HR 102 | Wt 187.1 lb

## 2018-01-15 DIAGNOSIS — R8290 Unspecified abnormal findings in urine: Secondary | ICD-10-CM

## 2018-01-15 DIAGNOSIS — N898 Other specified noninflammatory disorders of vagina: Secondary | ICD-10-CM | POA: Diagnosis not present

## 2018-01-15 DIAGNOSIS — Z3480 Encounter for supervision of other normal pregnancy, unspecified trimester: Secondary | ICD-10-CM

## 2018-01-15 NOTE — Progress Notes (Signed)
   PRENATAL VISIT NOTE  Subjective:  Valerie Taylor is a 32 y.o. G3P0020 at [redacted]w[redacted]d being seen today for ongoing prenatal care.  She is currently monitored for the following issues for this low-risk pregnancy and has Supervision of normal pregnancy; History of ectopic pregnancy; and History of pregnancy loss in prior pregnancy, currently pregnant on their problem list.  Patient reports vaginal irritation. Also complains of odor in urine.   Contractions: Irritability. Vag. Bleeding: None.  Movement: Present. Denies leaking of fluid.   The following portions of the patient's history were reviewed and updated as appropriate: allergies, current medications, past family history, past medical history, past social history, past surgical history and problem list. Problem list updated.  Objective:   Vitals:   01/15/18 1114  BP: 105/69  Pulse: (!) 102  Weight: 187 lb 1.6 oz (84.9 kg)   Fetal Status: Fetal Heart Rate (bpm): 143   Movement: Present     General:  Alert, oriented and cooperative. Patient is in no acute distress.  Skin: Skin is warm and dry. No rash noted.   Cardiovascular: Normal heart rate noted  Respiratory: Normal respiratory effort, no problems with respiration noted  Abdomen: Soft, gravid, appropriate for gestational age.  Pain/Pressure: Present     Pelvic: Cervical exam deferred        Extremities: Normal range of motion.  Edema: None  Mental Status: Normal mood and affect. Normal behavior. Normal judgment and thought content.   Assessment and Plan:  Pregnancy: G3P0020 at [redacted]w[redacted]d   1. Supervision of other normal pregnancy, antepartum  2. Vaginal irritation - Cervicovaginal ancillary only  3. Malodorous urine Urine culture  Preterm labor symptoms and general obstetric precautions including but not limited to vaginal bleeding, contractions, leaking of fluid and fetal movement were reviewed in detail with the patient. Please refer to After Visit Summary for other  counseling recommendations.  Return in about 4 weeks (around 02/12/2018) for OB visit.  Future Appointments  Date Time Provider Department Center  01/30/2018  9:30 AM [facility] [place] [facility]  01/31/2018  9:15 AM Conan Bowens, MD [facility] None    Conan Bowens, MD

## 2018-01-15 NOTE — Progress Notes (Signed)
Patient states she is not feeling fetal movement yet. Reports uterine irritability. Pt states she was treated for yeast infection, but has some vaginal irritation now.

## 2018-01-16 LAB — CERVICOVAGINAL ANCILLARY ONLY
Bacterial vaginitis: NEGATIVE
CANDIDA VAGINITIS: NEGATIVE

## 2018-01-17 LAB — URINE CULTURE

## 2018-01-21 ENCOUNTER — Telehealth: Payer: Self-pay

## 2018-01-21 ENCOUNTER — Inpatient Hospital Stay (HOSPITAL_COMMUNITY)
Admission: AD | Admit: 2018-01-21 | Discharge: 2018-01-21 | Disposition: A | Discharge status: Home / Self Care | Payer: BC Managed Care – PPO | Source: Ambulatory Visit | Attending: Obstetrics and Gynecology | Admitting: Obstetrics and Gynecology

## 2018-01-21 ENCOUNTER — Encounter (HOSPITAL_COMMUNITY): Payer: Self-pay

## 2018-01-21 DIAGNOSIS — O26892 Other specified pregnancy related conditions, second trimester: Secondary | ICD-10-CM | POA: Insufficient documentation

## 2018-01-21 DIAGNOSIS — R109 Unspecified abdominal pain: Secondary | ICD-10-CM | POA: Diagnosis not present

## 2018-01-21 DIAGNOSIS — Z79899 Other long term (current) drug therapy: Secondary | ICD-10-CM | POA: Diagnosis not present

## 2018-01-21 DIAGNOSIS — R102 Pelvic and perineal pain: Secondary | ICD-10-CM | POA: Insufficient documentation

## 2018-01-21 DIAGNOSIS — Z3A18 18 weeks gestation of pregnancy: Secondary | ICD-10-CM

## 2018-01-21 DIAGNOSIS — N949 Unspecified condition associated with female genital organs and menstrual cycle: Secondary | ICD-10-CM

## 2018-01-21 LAB — URINALYSIS, ROUTINE W REFLEX MICROSCOPIC
Bilirubin Urine: NEGATIVE
Glucose, UA: NEGATIVE mg/dL
HGB URINE DIPSTICK: NEGATIVE
Ketones, ur: NEGATIVE mg/dL
Leukocytes, UA: NEGATIVE
Nitrite: NEGATIVE
Protein, ur: NEGATIVE mg/dL
Specific Gravity, Urine: 1.02 (ref 1.005–1.030)
pH: 6 (ref 5.0–8.0)

## 2018-01-21 NOTE — Telephone Encounter (Signed)
Returned call, pt stated that yesterday and today she has had pelvic pain rating 6/10 lasting an hour at a time. Pt states that pain causes her to have to stop and lay down, and she reports lightheadedness, dizziness, and overall not feeling well. Advised pt to be evaluated at [facility].

## 2018-01-21 NOTE — MAU Note (Signed)
Had pain in lower abd, was really bad last night.  Happened while up walking.   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   No urinary symptoms.  No bleeding or leaking.  Has been having migraines.  (discussed/explained use of maternity belt for support)

## 2018-01-21 NOTE — Discharge Instructions (Signed)
A pregnancy belt may be helpful to relieve some of the pressure on the round ligaments. You can also try taking tylenol, sitting in a warm bath, or applying a heating pad to the lower abdomen.   Round Ligament Pain The round ligament is a cord of muscle and tissue that helps to support the uterus. It can become a source of pain during pregnancy if it becomes stretched or twisted as the baby grows. The pain usually begins in the second trimester of pregnancy, and it can come and go until the baby is delivered. It is not a serious problem, and it does not cause harm to the baby. Round ligament pain is usually a short, sharp, and pinching pain, but it can also be a dull, lingering, and aching pain. The pain is felt in the lower side of the abdomen or in the groin. It usually starts deep in the groin and moves up to the outside of the hip area. Pain can occur with:  A sudden change in position.  Rolling over in bed.  Coughing or sneezing.  Physical activity.  Follow these instructions at home: Watch your condition for any changes. Take these steps to help with your pain:  When the pain starts, relax. Then try: ? Sitting down. ? Flexing your knees up to your abdomen. ? Lying on your side with one pillow under your abdomen and another pillow between your legs. ? Sitting in a warm bath for 15-20 minutes or until the pain goes away.  Take over-the-counter and prescription medicines only as told by your health care provider.  Move slowly when you sit and stand.  Avoid long walks if they cause pain.  Stop or lessen your physical activities if they cause pain.  Contact a health care provider if:  Your pain does not go away with treatment.  You feel pain in your back that you did not have before.  Your medicine is not helping. Get help right away if:  You develop a fever or chills.  You develop uterine contractions.  You develop vaginal bleeding.  You develop nausea or  vomiting.  You develop diarrhea.  You have pain when you urinate. This information is not intended to replace advice given to you by your health care provider. Make sure you discuss any questions you have with your health care provider. Document Released: 12/28/2007 Document Revised: 08/26/2015 Document Reviewed: 05/27/2014 Elsevier Interactive Patient Education  [employer].

## 2018-01-21 NOTE — MAU Provider Note (Signed)
History     CSN: [id number]  Arrival date and time: 01/21/18 1148   None     Chief Complaint  Patient presents with  . Abdominal Pain   HPI   Valerie Taylor is a 32 y.o. G70P0020 female at [redacted]w[redacted]d who presents to MAU with lower abdominal pain. Reports this started yesterday while she was walking. Pain located bilaterally on either side of lower part of abdomen near groin. Describes pain as sharp and shooting. Denies vaginal bleeding and LOF. Has not tried anything for the pain. 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   OB History    Gravida  3   Para      Term      Preterm      AB  2   Living        SAB  1   TAB      Ectopic  1   Multiple      Live Births           Obstetric Comments  2004 per pt         History reviewed. No pertinent past medical history.  History reviewed. No pertinent surgical history.  History reviewed. No pertinent family history.  Social History   Tobacco Use  . Smoking status: Never Smoker  . Smokeless tobacco: Never Used  Substance Use Topics  . Alcohol use: Not Currently  . Drug use: Not Currently    Allergies: No Known Allergies  Medications Prior to Admission  Medication Sig Dispense Refill Last Dose  . Prenatal Vit-Fe Fumarate-FA (PRENATAL MULTIVITAMIN) TABS tablet Take 1 tablet by mouth daily at 12 noon.   Taking  . terconazole (TERAZOL 3) 0.8 % vaginal cream Place 1 applicator vaginally at bedtime. Apply nightly for three nights. (Patient not taking: Reported on 01/03/2018) 20 g 0 Not Taking    Review of Systems  Constitutional: Negative for appetite change, chills and fever.  Respiratory: Negative for chest tightness and shortness of breath.   Cardiovascular: Negative for chest pain.  Gastrointestinal: Positive for abdominal pain. Negative for constipation, diarrhea, nausea and vomiting.  Genitourinary: Negative for difficulty urinating, dysuria, urgency, vaginal  bleeding and vaginal discharge.  Musculoskeletal: Negative for back pain.  Neurological: Negative for weakness and numbness.   Physical Exam   Blood pressure 121/62, pulse 91, temperature 98 F (36.7 C), temperature source Oral, resp. rate 17, weight 84.3 kg, last menstrual period 09/17/2017, SpO2 100 %.  Physical Exam  Constitutional: She is oriented to person, place, and time. She appears well-developed and well-nourished. No distress.  HENT:  Head: Normocephalic and atraumatic.  Eyes: Conjunctivae and EOM are normal.  Neck: Normal range of motion. Neck supple.  Cardiovascular: Normal rate, regular rhythm and normal heart sounds.  No murmur heard. Respiratory: Effort normal and breath sounds normal. No respiratory distress.  GI: Soft. Bowel sounds are normal. She exhibits no distension. There is no tenderness. There is no rebound and no guarding.  Musculoskeletal: Normal range of motion. She exhibits no edema.  Neurological: She is alert and oriented to person, place, and time.  Skin: Skin is warm and dry. She is not diaphoretic.  Psychiatric: She has a normal mood and affect. Her behavior is normal.   FHR doppler: 140 bpm   MAU Course  Procedures  MDM Abdominal exam performed and benign. VSS. Normal FHR.   Assessment and Plan   1. Round ligament pain  2. [redacted] weeks gestation of pregnancy    Location of patient's pain consistent with round ligament pain. Discussed with patient that this is a common, benign occurrence associated with pregnancy. Recommended stretches to help with acute episodes of pain. Discussed use of pregnancy belt to help diminish tension on the ligaments. Offered other supportive care measures. Abdominal exam was benign. Patient denies symptoms that would be concerning for other etiologies of her pain such as cystitis or cervicitis. Return precautions discussed. Work note provided.   De Hollingshead 01/21/2018, 12:13 PM

## 2018-01-23 ENCOUNTER — Encounter (HOSPITAL_COMMUNITY): Payer: Self-pay

## 2018-01-30 ENCOUNTER — Ambulatory Visit (HOSPITAL_COMMUNITY)
Admission: RE | Admit: 2018-01-30 | Discharge: 2018-01-30 | Disposition: A | Discharge status: Home / Self Care | Payer: BC Managed Care – PPO | Source: Ambulatory Visit | Attending: Obstetrics and Gynecology | Admitting: Obstetrics and Gynecology

## 2018-01-30 ENCOUNTER — Other Ambulatory Visit (HOSPITAL_COMMUNITY): Payer: Self-pay | Admitting: *Deleted

## 2018-01-30 DIAGNOSIS — O4442 Low lying placenta NOS or without hemorrhage, second trimester: Secondary | ICD-10-CM | POA: Insufficient documentation

## 2018-01-30 DIAGNOSIS — Z3A19 19 weeks gestation of pregnancy: Secondary | ICD-10-CM | POA: Diagnosis not present

## 2018-01-30 DIAGNOSIS — O09292 Supervision of pregnancy with other poor reproductive or obstetric history, second trimester: Secondary | ICD-10-CM | POA: Diagnosis not present

## 2018-01-30 DIAGNOSIS — O09291 Supervision of pregnancy with other poor reproductive or obstetric history, first trimester: Secondary | ICD-10-CM

## 2018-01-30 DIAGNOSIS — O09299 Supervision of pregnancy with other poor reproductive or obstetric history, unspecified trimester: Secondary | ICD-10-CM | POA: Insufficient documentation

## 2018-01-30 DIAGNOSIS — Z3490 Encounter for supervision of normal pregnancy, unspecified, unspecified trimester: Secondary | ICD-10-CM

## 2018-01-30 DIAGNOSIS — Z363 Encounter for antenatal screening for malformations: Secondary | ICD-10-CM | POA: Insufficient documentation

## 2018-01-30 DIAGNOSIS — Z362 Encounter for other antenatal screening follow-up: Secondary | ICD-10-CM

## 2018-01-31 ENCOUNTER — Other Ambulatory Visit: Payer: Self-pay

## 2018-01-31 ENCOUNTER — Encounter: Payer: Self-pay | Admitting: Obstetrics and Gynecology

## 2018-01-31 ENCOUNTER — Ambulatory Visit (INDEPENDENT_AMBULATORY_CARE_PROVIDER_SITE_OTHER): Payer: BC Managed Care – PPO | Admitting: Obstetrics and Gynecology

## 2018-01-31 VITALS — BP 108/72 | HR 101 | Wt 190.7 lb

## 2018-01-31 DIAGNOSIS — Z3482 Encounter for supervision of other normal pregnancy, second trimester: Secondary | ICD-10-CM

## 2018-01-31 DIAGNOSIS — O4442 Low lying placenta NOS or without hemorrhage, second trimester: Secondary | ICD-10-CM | POA: Insufficient documentation

## 2018-01-31 DIAGNOSIS — O09292 Supervision of pregnancy with other poor reproductive or obstetric history, second trimester: Secondary | ICD-10-CM

## 2018-01-31 DIAGNOSIS — Z3480 Encounter for supervision of other normal pregnancy, unspecified trimester: Secondary | ICD-10-CM

## 2018-01-31 DIAGNOSIS — O4440 Low lying placenta NOS or without hemorrhage, unspecified trimester: Secondary | ICD-10-CM | POA: Insufficient documentation

## 2018-01-31 DIAGNOSIS — O4403 Placenta previa specified as without hemorrhage, third trimester: Secondary | ICD-10-CM | POA: Insufficient documentation

## 2018-01-31 NOTE — Progress Notes (Signed)
   PRENATAL VISIT NOTE  Subjective:  Valerie Taylor is a 32 y.o. G3P0020 at [redacted]w[redacted]d being seen today for ongoing prenatal care.  She is currently monitored for the following issues for this low-risk pregnancy and has Supervision of normal pregnancy; History of ectopic pregnancy; History of pregnancy loss in prior pregnancy, currently pregnant; and Low-lying placenta on their problem list.  Patient reports no complaints.  Contractions: Not present. Vag. Bleeding: None.  Movement: Present. Denies leaking of fluid.   The following portions of the patient's history were reviewed and updated as appropriate: allergies, current medications, past family history, past medical history, past social history, past surgical history and problem list. Problem list updated.  Objective:   Vitals:   01/31/18 0929  BP: 108/72  Pulse: (!) 101  Weight: 190 lb 11.2 oz (86.5 kg)    Fetal Status:     Movement: Present     General:  Alert, oriented and cooperative. Patient is in no acute distress.  Skin: Skin is warm and dry. No rash noted.   Cardiovascular: Normal heart rate noted  Respiratory: Normal respiratory effort, no problems with respiration noted  Abdomen: Soft, gravid, appropriate for gestational age.  Pain/Pressure: Absent     Pelvic: Cervical exam deferred        Extremities: Normal range of motion.  Edema: None  Mental Status: Normal mood and affect. Normal behavior. Normal judgment and thought content.   Assessment and Plan:  Pregnancy: G3P0020 at [redacted]w[redacted]d  1. Supervision of other normal pregnancy, antepartum   2. History of pregnancy loss in prior pregnancy, currently pregnant in second trimester  3. Low-lying placenta 7 mm from os anteriorly Reviewed with patient, pelvic precautions F/u US already scheduled   Preterm labor symptoms and general obstetric precautions including but not limited to vaginal bleeding, contractions, leaking of fluid and fetal movement were reviewed in detail  with the patient. Please refer to After Visit Summary for other counseling recommendations.  Return in about 4 weeks (around 02/28/2018) for OB visit.  Future Appointments  Date Time Provider Department Center  02/27/2018  8:45 AM [facility] [place] 5 [facility]  03/06/2018  9:30 AM Constant, Gigi Gin, MD [facility] None  04/01/2018  9:30 AM [facility] [place] 1 [facility]    Conan Bowens, MD

## 2018-02-01 ENCOUNTER — Encounter (HOSPITAL_COMMUNITY): Payer: Self-pay | Admitting: *Deleted

## 2018-02-01 ENCOUNTER — Other Ambulatory Visit: Payer: Self-pay

## 2018-02-01 ENCOUNTER — Inpatient Hospital Stay (HOSPITAL_COMMUNITY)
Admission: AD | Admit: 2018-02-01 | Discharge: 2018-02-01 | Disposition: A | Discharge status: Home / Self Care | Payer: BC Managed Care – PPO | Source: Ambulatory Visit | Attending: Obstetrics & Gynecology | Admitting: Obstetrics & Gynecology

## 2018-02-01 DIAGNOSIS — Z3A19 19 weeks gestation of pregnancy: Secondary | ICD-10-CM | POA: Diagnosis not present

## 2018-02-01 DIAGNOSIS — O98812 Other maternal infectious and parasitic diseases complicating pregnancy, second trimester: Secondary | ICD-10-CM | POA: Diagnosis not present

## 2018-02-01 DIAGNOSIS — Z87440 Personal history of urinary (tract) infections: Secondary | ICD-10-CM | POA: Insufficient documentation

## 2018-02-01 DIAGNOSIS — Z825 Family history of asthma and other chronic lower respiratory diseases: Secondary | ICD-10-CM | POA: Diagnosis not present

## 2018-02-01 DIAGNOSIS — Z87442 Personal history of urinary calculi: Secondary | ICD-10-CM | POA: Diagnosis not present

## 2018-02-01 DIAGNOSIS — R102 Pelvic and perineal pain: Secondary | ICD-10-CM | POA: Diagnosis present

## 2018-02-01 DIAGNOSIS — O98819 Other maternal infectious and parasitic diseases complicating pregnancy, unspecified trimester: Secondary | ICD-10-CM

## 2018-02-01 DIAGNOSIS — B373 Candidiasis of vulva and vagina: Secondary | ICD-10-CM | POA: Diagnosis not present

## 2018-02-01 HISTORY — DX: Unspecified infectious disease: B99.9

## 2018-02-01 HISTORY — DX: Headache, unspecified: R51.9

## 2018-02-01 HISTORY — DX: Calculus of kidney: N20.0

## 2018-02-01 HISTORY — DX: Headache: R51

## 2018-02-01 LAB — WET PREP, GENITAL
Clue Cells Wet Prep HPF POC: NONE SEEN
SPERM: NONE SEEN
TRICH WET PREP: NONE SEEN
WBC WET PREP: NONE SEEN
Yeast Wet Prep HPF POC: NONE SEEN

## 2018-02-01 LAB — URINALYSIS, ROUTINE W REFLEX MICROSCOPIC
BILIRUBIN URINE: NEGATIVE
Glucose, UA: NEGATIVE mg/dL
HGB URINE DIPSTICK: NEGATIVE
Ketones, ur: NEGATIVE mg/dL
Leukocytes, UA: NEGATIVE
Nitrite: NEGATIVE
PH: 5 (ref 5.0–8.0)
Protein, ur: NEGATIVE mg/dL
SPECIFIC GRAVITY, URINE: 1.024 (ref 1.005–1.030)

## 2018-02-01 MED ORDER — TERCONAZOLE 0.4 % VA CREA: 1.0000 | TOPICAL_CREAM | Freq: Every day | VAGINAL | 0 refills | Status: DC

## 2018-02-01 NOTE — MAU Note (Signed)
Thinks she has a yeast or bacterial infection.  Was hurting bad when went to use the restroom today.  Feels swollen.  Was fine yesterday.

## 2018-02-01 NOTE — MAU Provider Note (Signed)
History     CSN: [id number]  Arrival date and time: 02/01/18 0944   First Provider Initiated Contact with Patient 02/01/18 1049      Chief Complaint  Patient presents with  . Vaginal Pain   HPI Valerie Taylor is a 32 y.o. G3P0020 at [redacted]w[redacted]d who presents with vaginal irritation and vaginal pain. She states she noticed some irritation last night and it started to burn today. She reports a history of frequent yeast and BV. She states she feels like this is another yeast infection. Denies leaking or bleeding. Denies abdominal pain.   OB History    Gravida  3   Para      Term      Preterm      AB  2   Living  0     SAB  1   TAB      Ectopic  1   Multiple      Live Births  0        Obstetric Comments  2004 per pt         Past Medical History:  Diagnosis Date  . Headache   . Infection    UTI  . Kidney stones     Past Surgical History:  Procedure Laterality Date  . NO PAST SURGERIES      Family History  Problem Relation Age of Onset  . Healthy Mother   . Healthy Father   . Asthma Sister   . Cancer Maternal Aunt        breast    Social History   Tobacco Use  . Smoking status: Never Smoker  . Smokeless tobacco: Never Used  Substance Use Topics  . Alcohol use: Never    Frequency: Never  . Drug use: Never    Allergies: No Known Allergies  Medications Prior to Admission  Medication Sig Dispense Refill Last Dose  . Prenatal Vit-Fe Fumarate-FA (PRENATAL MULTIVITAMIN) TABS tablet Take 1 tablet by mouth daily at 12 noon.   Taking    Review of Systems  Constitutional: Negative.  Negative for fatigue and fever.  HENT: Negative.   Respiratory: Negative.  Negative for shortness of breath.   Cardiovascular: Negative.  Negative for chest pain.  Gastrointestinal: Negative.  Negative for abdominal pain, constipation, diarrhea, nausea and vomiting.  Genitourinary: Positive for vaginal discharge and vaginal pain. Negative for dysuria and vaginal  bleeding.  Neurological: Negative.  Negative for dizziness and headaches.   Physical Exam   Blood pressure (!) 120/59, pulse 87, temperature 98.5 F (36.9 C), temperature source Oral, resp. rate 17, weight 85 kg, last menstrual period 09/17/2017, SpO2 100 %.  Physical Exam  Nursing note and vitals reviewed. Constitutional: She is oriented to person, place, and time. She appears well-developed and well-nourished. No distress.  HENT:  Head: Normocephalic.  Eyes: Pupils are equal, round, and reactive to light.  Cardiovascular: Normal rate, regular rhythm and normal heart sounds.  Respiratory: Effort normal and breath sounds normal. No respiratory distress.  GI: Soft. Bowel sounds are normal. She exhibits no distension. There is no tenderness.  Neurological: She is alert and oriented to person, place, and time.  Skin: Skin is warm and dry.  Psychiatric: She has a normal mood and affect. Her behavior is normal. Judgment and thought content normal.    FHT: 142 bpm  MAU Course  Procedures Results for orders placed or performed during the hospital encounter of 02/01/18 (from the past 24 hour(s))  Urinalysis, Routine w reflex  microscopic     Status: Abnormal   Collection Time: 02/01/18 10:20 AM  Result Value Ref Range   Color, Urine YELLOW YELLOW   APPearance HAZY (A) CLEAR   Specific Gravity, Urine 1.024 1.005 - 1.030   pH 5.0 5.0 - 8.0   Glucose, UA NEGATIVE NEGATIVE mg/dL   Hgb urine dipstick NEGATIVE NEGATIVE   Bilirubin Urine NEGATIVE NEGATIVE   Ketones, ur NEGATIVE NEGATIVE mg/dL   Protein, ur NEGATIVE NEGATIVE mg/dL   Nitrite NEGATIVE NEGATIVE   Leukocytes, UA NEGATIVE NEGATIVE  Wet prep, genital     Status: None   Collection Time: 02/01/18 11:05 AM  Result Value Ref Range   Yeast Wet Prep HPF POC NONE SEEN NONE SEEN   Trich, Wet Prep NONE SEEN NONE SEEN   Clue Cells Wet Prep HPF POC NONE SEEN NONE SEEN   WBC, Wet Prep HPF POC NONE SEEN NONE SEEN   Sperm NONE SEEN     MDM UA Wet prep and gc/chlamydia Clinical findings of irritation and white thick discharge consistent with yeast.   Assessment and Plan   1. Candidiasis of vagina during pregnancy   2. [redacted] weeks gestation of pregnancy    -Discharge home in stable condition -Rx for terazol sent to patient's pharmacy -Patient advised to follow-up with [facility] as scheduled for prenatal care -Patient may return to MAU as needed or if her condition were to change or worsen  Rolm Bookbinder CNM 02/01/2018, 10:49 AM

## 2018-02-01 NOTE — Discharge Instructions (Signed)
Vaginal Yeast infection, Adult °Vaginal yeast infection is a condition that causes soreness, swelling, and redness (inflammation) of the vagina. It also causes vaginal discharge. This is a common condition. Some women get this infection frequently. °What are the causes? °This condition is caused by a change in the normal balance of the yeast (candida) and bacteria that live in the vagina. This change causes an overgrowth of yeast, which causes the inflammation. °What increases the risk? °This condition is more likely to develop in: °· Women who take antibiotic medicines. °· Women who have diabetes. °· Women who take birth control pills. °· Women who are pregnant. °· Women who douche often. °· Women who have a weak defense (immune) system. °· Women who have been taking steroid medicines for a long time. °· Women who frequently wear tight clothing. ° °What are the signs or symptoms? °Symptoms of this condition include: °· White, thick vaginal discharge. °· Swelling, itching, redness, and irritation of the vagina. The lips of the vagina (vulva) may be affected as well. °· Pain or a burning feeling while urinating. °· Pain during sex. ° °How is this diagnosed? °This condition is diagnosed with a medical history and physical exam. This will include a pelvic exam. Your health care provider will examine a sample of your vaginal discharge under a microscope. Your health care provider may send this sample for testing to confirm the diagnosis. °How is this treated? °This condition is treated with medicine. Medicines may be over-the-counter or prescription. You may be told to use one or more of the following: °· Medicine that is taken orally. °· Medicine that is applied as a cream. °· Medicine that is inserted directly into the vagina (suppository). ° °Follow these instructions at home: °· Take or apply over-the-counter and prescription medicines only as told by your health care provider. °· Do not have sex until your health  care provider has approved. Tell your sex partner that you have a yeast infection. That person should go to his or her health care provider if he or she develops symptoms. °· Do not wear tight clothes, such as pantyhose or tight pants. °· Avoid using tampons until your health care provider approves. °· Eat more yogurt. This may help to keep your yeast infection from returning. °· Try taking a sitz bath to help with discomfort. This is a warm water bath that is taken while you are sitting down. The water should only come up to your hips and should cover your buttocks. Do this 3-4 times per day or as told by your health care provider. °· Do not douche. °· Wear breathable, cotton underwear. °· If you have diabetes, keep your blood sugar levels under control. °Contact a health care provider if: °· You have a fever. °· Your symptoms go away and then return. °· Your symptoms do not get better with treatment. °· Your symptoms get worse. °· You have new symptoms. °· You develop blisters in or around your vagina. °· You have blood coming from your vagina and it is not your menstrual period. °· You develop pain in your abdomen. °This information is not intended to replace advice given to you by your health care provider. Make sure you discuss any questions you have with your health care provider. °Document Released: 12/28/2004 Document Revised: 09/01/2015 Document Reviewed: 09/21/2014 °Elsevier Interactive Patient Education © 2018 [employer] ° °Safe Medications in Pregnancy  ° °Acne: °Benzoyl Peroxide °Salicylic Acid ° °Backache/Headache: °Tylenol: 2 regular strength every 4 hours   OR °             2 Extra strength every 6 hours ° °Colds/Coughs/Allergies: °Benadryl (alcohol free) 25 mg every 6 hours as needed °Breath right strips °Claritin °Cepacol throat lozenges °Chloraseptic throat spray °Cold-Eeze- up to three times per day °Cough drops, alcohol free °Flonase (by prescription only) °Guaifenesin °Mucinex °Robitussin DM  (plain only, alcohol free) °Saline nasal spray/drops °Sudafed (pseudoephedrine) & Actifed ** use only after [redacted] weeks gestation and if you do not have high blood pressure °Tylenol °Vicks Vaporub °Zinc lozenges °Zyrtec  ° °Constipation: °Colace °Ducolax suppositories °Fleet enema °Glycerin suppositories °Metamucil °Milk of magnesia °Miralax °Senokot °Smooth move tea ° °Diarrhea: °Kaopectate °Imodium A-D ° °*NO pepto Bismol ° °Hemorrhoids: °Anusol °Anusol HC °Preparation H °Tucks ° °Indigestion: °Tums °Maalox °Mylanta °Zantac  °Pepcid ° °Insomnia: °Benadryl (alcohol free) 25mg every 6 hours as needed °Tylenol PM °Unisom, no Gelcaps ° °Leg Cramps: °Tums °MagGel ° °Nausea/Vomiting:  °Bonine °Dramamine °Emetrol °Ginger extract °Sea bands °Meclizine  °Nausea medication to take during pregnancy:  °Unisom (doxylamine succinate 25 mg tablets) Take one tablet daily at bedtime. If symptoms are not adequately controlled, the dose can be increased to a maximum recommended dose of two tablets daily (1/2 tablet in the morning, 1/2 tablet mid-afternoon and one at bedtime). °Vitamin B6 100mg tablets. Take one tablet twice a day (up to 200 mg per day). ° °Skin Rashes: °Aveeno products °Benadryl cream or 25mg every 6 hours as needed °Calamine Lotion °1% cortisone cream ° °Yeast infection: °Gyne-lotrimin 7 °Monistat 7 ° ° °**If taking multiple medications, please check labels to avoid duplicating the same active ingredients °**take medication as directed on the label °** Do not exceed 4000 mg of tylenol in 24 hours °**Do not take medications that contain aspirin or ibuprofen ° ° ° ° °

## 2018-02-04 LAB — GC/CHLAMYDIA PROBE AMP (~~LOC~~) NOT AT ARMC
Chlamydia: NEGATIVE
NEISSERIA GONORRHEA: NEGATIVE

## 2018-02-27 ENCOUNTER — Ambulatory Visit (HOSPITAL_COMMUNITY)
Admission: RE | Admit: 2018-02-27 | Discharge: 2018-02-27 | Disposition: A | Discharge status: Home / Self Care | Payer: BC Managed Care – PPO | Source: Ambulatory Visit | Attending: Obstetrics and Gynecology | Admitting: Obstetrics and Gynecology

## 2018-02-27 DIAGNOSIS — O4422 Partial placenta previa NOS or without hemorrhage, second trimester: Secondary | ICD-10-CM | POA: Diagnosis not present

## 2018-02-27 DIAGNOSIS — O09292 Supervision of pregnancy with other poor reproductive or obstetric history, second trimester: Secondary | ICD-10-CM

## 2018-02-27 DIAGNOSIS — O4442 Low lying placenta NOS or without hemorrhage, second trimester: Secondary | ICD-10-CM | POA: Insufficient documentation

## 2018-02-27 DIAGNOSIS — Z362 Encounter for other antenatal screening follow-up: Secondary | ICD-10-CM | POA: Insufficient documentation

## 2018-02-27 DIAGNOSIS — Z3A23 23 weeks gestation of pregnancy: Secondary | ICD-10-CM | POA: Diagnosis not present

## 2018-03-06 ENCOUNTER — Other Ambulatory Visit: Payer: Self-pay

## 2018-03-06 ENCOUNTER — Ambulatory Visit (INDEPENDENT_AMBULATORY_CARE_PROVIDER_SITE_OTHER): Payer: BC Managed Care – PPO | Admitting: Obstetrics and Gynecology

## 2018-03-06 ENCOUNTER — Encounter: Payer: Self-pay | Admitting: Obstetrics and Gynecology

## 2018-03-06 VITALS — BP 102/59 | HR 86 | Wt 192.0 lb

## 2018-03-06 DIAGNOSIS — O4440 Low lying placenta NOS or without hemorrhage, unspecified trimester: Secondary | ICD-10-CM

## 2018-03-06 DIAGNOSIS — Z3482 Encounter for supervision of other normal pregnancy, second trimester: Secondary | ICD-10-CM

## 2018-03-06 LAB — POCT URINALYSIS DIPSTICK
Bilirubin, UA: NEGATIVE
Blood, UA: NEGATIVE
GLUCOSE UA: NEGATIVE
NITRITE UA: NEGATIVE
PROTEIN UA: POSITIVE — AB
SPEC GRAV UA: 1.02 (ref 1.010–1.025)
Urobilinogen, UA: 0.2 E.U./dL
pH, UA: 5 (ref 5.0–8.0)

## 2018-03-06 NOTE — Progress Notes (Signed)
   PRENATAL VISIT NOTE  Subjective:  Marquise Lyman BishopLawrence is a 32 y.o. G3P0020 at 5842w2d being seen today for ongoing prenatal care.  She is currently monitored for the following issues for this low-risk pregnancy and has Supervision of normal pregnancy; History of ectopic pregnancy; History of pregnancy loss in prior pregnancy, currently pregnant; and Low-lying placenta on their problem list.  Patient reports dysuria.  Contractions: Not present. Vag. Bleeding: None.  Movement: Present. Denies leaking of fluid.   The following portions of the patient's history were reviewed and updated as appropriate: allergies, current medications, past family history, past medical history, past social history, past surgical history and problem list. Problem list updated.  Objective:   Vitals:   03/06/18 0938  BP: (!) 102/59  Pulse: 86  Weight: 192 lb (87.1 kg)    Fetal Status: Fetal Heart Rate (bpm): 142   Movement: Present     General:  Alert, oriented and cooperative. Patient is in no acute distress.  Skin: Skin is warm and dry. No rash noted.   Cardiovascular: Normal heart rate noted  Respiratory: Normal respiratory effort, no problems with respiration noted  Abdomen: Soft, gravid, appropriate for gestational age.  Pain/Pressure: Absent     Pelvic: Cervical exam deferred        Extremities: Normal range of motion.  Edema: None  Mental Status: Normal mood and affect. Normal behavior. Normal judgment and thought content.   Assessment and Plan:  Pregnancy: G3P0020 at 6142w2d  1. Encounter for supervision of other normal pregnancy in second trimester Patient is doing well Urine culture collected Third trimester labs next visit  2. Low-lying placenta Asymptomatic Follow up ultrasound scheduled end of December  Preterm labor symptoms and general obstetric precautions including but not limited to vaginal bleeding, contractions, leaking of fluid and fetal movement were reviewed in detail with the  patient. Please refer to After Visit Summary for other counseling recommendations.  Return in about 4 weeks (around 04/03/2018) for ROB, 2 hr glucola next visit.  Future Appointments  Date Time Provider Department Center  04/01/2018  9:30 AM [facility] 1 [facility]    Catalina AntiguaPeggy Azaylah Stailey, MD

## 2018-03-06 NOTE — Progress Notes (Signed)
ROB.  C/o itching and pain when she urinates.

## 2018-03-08 LAB — CULTURE, OB URINE

## 2018-03-08 LAB — URINE CULTURE, OB REFLEX

## 2018-03-08 MED ORDER — FLUCONAZOLE 150 MG PO TABS: 150.0000 mg | ORAL_TABLET | Freq: Once | ORAL | 0 refills | Status: AC

## 2018-03-08 NOTE — Addendum Note (Signed)
Addended by: Catalina AntiguaONSTANT, Landis Dowdy on: 03/08/2018 08:20 AM   Modules accepted: Orders

## 2018-03-28 ENCOUNTER — Ambulatory Visit (INDEPENDENT_AMBULATORY_CARE_PROVIDER_SITE_OTHER): Payer: BC Managed Care – PPO | Admitting: Certified Nurse Midwife

## 2018-03-28 ENCOUNTER — Encounter: Payer: Self-pay | Admitting: Certified Nurse Midwife

## 2018-03-28 VITALS — BP 110/75 | HR 90 | Wt 190.9 lb

## 2018-03-28 DIAGNOSIS — Z113 Encounter for screening for infections with a predominantly sexual mode of transmission: Secondary | ICD-10-CM

## 2018-03-28 DIAGNOSIS — O23593 Infection of other part of genital tract in pregnancy, third trimester: Secondary | ICD-10-CM

## 2018-03-28 DIAGNOSIS — N760 Acute vaginitis: Secondary | ICD-10-CM

## 2018-03-28 DIAGNOSIS — B373 Candidiasis of vulva and vagina: Secondary | ICD-10-CM

## 2018-03-28 DIAGNOSIS — N898 Other specified noninflammatory disorders of vagina: Secondary | ICD-10-CM

## 2018-03-28 LAB — POCT URINALYSIS DIPSTICK
Bilirubin, UA: NEGATIVE
Blood, UA: NEGATIVE
Glucose, UA: NEGATIVE
Ketones, UA: NEGATIVE
Nitrite, UA: NEGATIVE
Protein, UA: NEGATIVE
Spec Grav, UA: 1.015 (ref 1.010–1.025)
Urobilinogen, UA: NEGATIVE E.U./dL — AB
pH, UA: 7 (ref 5.0–8.0)

## 2018-03-28 NOTE — Progress Notes (Signed)
  Subjective:     Patient ID: Valerie Taylor, female   DOB: 1985/06/29, 32 y.o.   MRN: [id number]  Valerie Taylor is a 32 y.o. G3P0 at 1870w3d who presents to the office for a problem visit. She reports vaginal irritation and itching for the past week. She denies seeing any abnormal discharge but reports irritation when wearing underwear and being tender to touch. She denies changes in partners. She denies pregnancy related concerns, no abdominal pain/cramping or vaginal bleeding. She reports occasional pressure after urination that is associated with occasional dysuria.    Review of Systems  Constitutional: Negative.   Respiratory: Negative.   Cardiovascular: Negative.   Gastrointestinal: Negative.   Genitourinary: Positive for dysuria. Negative for difficulty urinating, frequency, pelvic pain, vaginal bleeding, vaginal discharge and vaginal pain.       Vaginal irritation and itching    Objective:   Physical Exam Constitutional:      General: She is not in acute distress.    Appearance: Normal appearance.  Cardiovascular:     Rate and Rhythm: Normal rate and regular rhythm.  Pulmonary:     Effort: Pulmonary effort is normal.     Breath sounds: Normal breath sounds.  Genitourinary:    Vagina: No vaginal discharge.     Comments: Vaginal introitus tender to touch, irritation and redness noted. Vaginal swabs collected.  Musculoskeletal: Normal range of motion.        General: No swelling.     Right lower leg: No edema.     Left lower leg: No edema.    Today's Vitals   03/28/18 1004  BP: 110/75  Pulse: 90  Weight: 190 lb 14.4 oz (86.6 kg)   Body mass index is 32.77 kg/m.  Assessment/Plan:  1. Vaginitis affecting pregnancy in third trimester, antepartum - Discussed use of cotton underwear and no underwear at night while sleeping  - Educated on results of swabs collected and MyChart message to be sent with results and possible treatments  - Follow up as scheduled for routine  prenatal appointment scheduled on 04/04/17 with GTT that day, reviewed need to come to appointment fasting.  - Urine Culture - POCT Urinalysis Dipstick - [facility] [place]  Sharyon CableVeronica C Samaa Ueda, CNM 03/28/18, 11:07 AM

## 2018-03-28 NOTE — Progress Notes (Signed)
Pt is here with c/o vaginal itching and burning. Pt is 2477w3d gestation.

## 2018-03-29 ENCOUNTER — Encounter (HOSPITAL_COMMUNITY): Payer: Self-pay | Admitting: *Deleted

## 2018-03-29 ENCOUNTER — Inpatient Hospital Stay (HOSPITAL_COMMUNITY)
Admission: AD | Admit: 2018-03-29 | Discharge: 2018-03-29 | Disposition: A | Discharge status: Home / Self Care | Payer: BC Managed Care – PPO | Source: Ambulatory Visit | Attending: Obstetrics & Gynecology | Admitting: Obstetrics & Gynecology

## 2018-03-29 DIAGNOSIS — Z3689 Encounter for other specified antenatal screening: Secondary | ICD-10-CM

## 2018-03-29 DIAGNOSIS — O26892 Other specified pregnancy related conditions, second trimester: Secondary | ICD-10-CM

## 2018-03-29 DIAGNOSIS — Z3A27 27 weeks gestation of pregnancy: Secondary | ICD-10-CM

## 2018-03-29 DIAGNOSIS — N898 Other specified noninflammatory disorders of vagina: Secondary | ICD-10-CM | POA: Diagnosis not present

## 2018-03-29 DIAGNOSIS — O98812 Other maternal infectious and parasitic diseases complicating pregnancy, second trimester: Secondary | ICD-10-CM | POA: Insufficient documentation

## 2018-03-29 DIAGNOSIS — R102 Pelvic and perineal pain: Secondary | ICD-10-CM | POA: Diagnosis present

## 2018-03-29 DIAGNOSIS — O4440 Low lying placenta NOS or without hemorrhage, unspecified trimester: Secondary | ICD-10-CM | POA: Diagnosis not present

## 2018-03-29 DIAGNOSIS — B373 Candidiasis of vulva and vagina: Secondary | ICD-10-CM | POA: Diagnosis not present

## 2018-03-29 LAB — URINALYSIS, ROUTINE W REFLEX MICROSCOPIC
BILIRUBIN URINE: NEGATIVE
Glucose, UA: NEGATIVE mg/dL
Hgb urine dipstick: NEGATIVE
KETONES UR: NEGATIVE mg/dL
LEUKOCYTES UA: NEGATIVE
Nitrite: NEGATIVE
PROTEIN: NEGATIVE mg/dL
Specific Gravity, Urine: 1.01 (ref 1.005–1.030)
pH: 7 (ref 5.0–8.0)

## 2018-03-29 LAB — CERVICOVAGINAL ANCILLARY ONLY
Bacterial vaginitis: NEGATIVE
Candida vaginitis: POSITIVE — AB
Chlamydia: NEGATIVE
Neisseria Gonorrhea: NEGATIVE
Trichomonas: NEGATIVE

## 2018-03-29 LAB — WET PREP, GENITAL
CLUE CELLS WET PREP: NONE SEEN
Sperm: NONE SEEN
Trich, Wet Prep: NONE SEEN
Yeast Wet Prep HPF POC: NONE SEEN

## 2018-03-29 MED ORDER — TERCONAZOLE 0.4 % VA CREA: 1.0000 | TOPICAL_CREAM | Freq: Every day | VAGINAL | 0 refills | Status: DC

## 2018-03-29 NOTE — Discharge Instructions (Signed)
Vaginal Yeast infection, Adult    Vaginal yeast infection is a condition that causes vaginal discharge as well as soreness, swelling, and redness (inflammation) of the vagina. This is a common condition. Some women get this infection frequently.  What are the causes?  This condition is caused by a change in the normal balance of the yeast (candida) and bacteria that live in the vagina. This change causes an overgrowth of yeast, which causes the inflammation.  What increases the risk?  The condition is more likely to develop in women who:   Take antibiotic medicines.   Have diabetes.   Take birth control pills.   Are pregnant.   Douche often.   Have a weak body defense system (immune system).   Have been taking steroid medicines for a long time.   Frequently wear tight clothing.  What are the signs or symptoms?  Symptoms of this condition include:   White, thick, creamy vaginal discharge.   Swelling, itching, redness, and irritation of the vagina. The lips of the vagina (vulva) may be affected as well.   Pain or a burning feeling while urinating.   Pain during sex.  How is this diagnosed?  This condition is diagnosed based on:   Your medical history.   A physical exam.   A pelvic exam. Your health care provider will examine a sample of your vaginal discharge under a microscope. Your health care provider may send this sample for testing to confirm the diagnosis.  How is this treated?  This condition is treated with medicine. Medicines may be over-the-counter or prescription. You may be told to use one or more of the following:   Medicine that is taken by mouth (orally).   Medicine that is applied as a cream (topically).   Medicine that is inserted directly into the vagina (suppository).  Follow these instructions at home:    Lifestyle   Do not have sex until your health care provider approves. Tell your sex partner that you have a yeast infection. That person should go to his or her health care  provider and ask if they should also be treated.   Do not wear tight clothes, such as pantyhose or tight pants.   Wear breathable cotton underwear.  General instructions   Take or apply over-the-counter and prescription medicines only as told by your health care provider.   Eat more yogurt. This may help to keep your yeast infection from returning.   Do not use tampons until your health care provider approves.   Try taking a sitz bath to help with discomfort. This is a warm water bath that is taken while you are sitting down. The water should only come up to your hips and should cover your buttocks. Do this 3-4 times per day or as told by your health care provider.   Do not douche.   If you have diabetes, keep your blood sugar levels under control.   Keep all follow-up visits as told by your health care provider. This is important.  Contact a health care provider if:   You have a fever.   Your symptoms go away and then return.   Your symptoms do not get better with treatment.   Your symptoms get worse.   You have new symptoms.   You develop blisters in or around your vagina.   You have blood coming from your vagina and it is not your menstrual period.   You develop pain in your abdomen.  Summary     Vaginal yeast infection is a condition that causes discharge as well as soreness, swelling, and redness (inflammation) of the vagina.   This condition is treated with medicine. Medicines may be over-the-counter or prescription.   Take or apply over-the-counter and prescription medicines only as told by your health care provider.   Do not douche. Do not have sex or use tampons until your health care provider approves.   Contact a health care provider if your symptoms do not get better with treatment or your symptoms go away and then return.  This information is not intended to replace advice given to you by your health care provider. Make sure you discuss any questions you have with your health care  provider.  Document Released: 12/28/2004 Document Revised: 08/06/2017 Document Reviewed: 08/06/2017  Elsevier Interactive Patient Education  2019 [employer]

## 2018-03-29 NOTE — MAU Provider Note (Signed)
History     CSN: [id number]  Arrival date and time: 03/29/18 1550   First Provider Initiated Contact with Patient 03/29/18 1630      Chief Complaint  Patient presents with  . Vaginal Pain   HPI Valerie Taylor is a 32 y.o. G3P0020 at 6873w4d who presents to MAU with chief complaint of worsening vaginal pain and thick vaginal discharge. She had swabs collected in clinic yesterday but states she cannot wait for those results and needs medication ASAP. She denies vaginal bleeding, leaking of fluid, decreased fetal movement, fever, falls, or recent illness.    OB History    Gravida  3   Para      Term      Preterm      AB  2   Living  0     SAB  1   TAB      Ectopic  1   Multiple      Live Births  0        Obstetric Comments  2004 per pt         Past Medical History:  Diagnosis Date  . Headache   . Infection    UTI  . Kidney stones     Past Surgical History:  Procedure Laterality Date  . NO PAST SURGERIES      Family History  Problem Relation Age of Onset  . Healthy Mother   . Healthy Father   . Asthma Sister   . Cancer Maternal Aunt        breast    Social History   Tobacco Use  . Smoking status: Never Smoker  . Smokeless tobacco: Never Used  Substance Use Topics  . Alcohol use: Never    Frequency: Never  . Drug use: Never    Allergies: No Known Allergies  Medications Prior to Admission  Medication Sig Dispense Refill Last Dose  . Prenatal Vit-Fe Fumarate-FA (PRENATAL MULTIVITAMIN) TABS tablet Take 1 tablet by mouth daily at 12 noon.   03/28/2018 at Unknown time  . acetaminophen (TYLENOL) 500 MG tablet Take 500 mg by mouth every 6 (six) hours as needed for headache.   Unknown at Unknown time  . terconazole (TERAZOL 7) 0.4 % vaginal cream Place 1 applicator vaginally at bedtime. (Patient not taking: Reported on 03/28/2018) 45 g 0 Not Taking    Review of Systems  Constitutional: Negative for chills, fatigue and fever.   Gastrointestinal: Negative for abdominal pain.  Genitourinary: Positive for vaginal discharge and vaginal pain. Negative for difficulty urinating, dyspareunia, dysuria and vaginal bleeding.  Musculoskeletal: Negative for back pain.  Neurological: Negative for headaches.  All other systems reviewed and are negative.  Physical Exam   Blood pressure 112/61, pulse 98, temperature 98.4 F (36.9 C), temperature source Oral, resp. rate 15, height 5\' 4"  (1.626 m), weight 87.1 kg, last menstrual period 09/17/2017, SpO2 100 %.  Physical Exam  Nursing note and vitals reviewed. Constitutional: She is oriented to person, place, and time. She appears well-developed and well-nourished.  Cardiovascular: Normal rate.  Respiratory: Effort normal and breath sounds normal.  GI: Soft. She exhibits no distension. There is no abdominal tenderness. There is no rebound and no guarding.  Genitourinary:    Vaginal discharge present.     Genitourinary Comments: Thick white vaginal discharge on labia and throughout vaginal vault   Neurological: She is alert and oriented to person, place, and time.  Psychiatric: She has a normal mood and affect. Her behavior is  normal. Judgment and thought content normal.    MAU Course/MDM   --Treat for yeast infection based on thick white discharge and history of same --Reactive tracing: baseline 135, positive 10 x 10 accels, no decels --Toco: quiet  Patient Vitals for the past 24 hrs:  BP Temp Temp src Pulse Resp SpO2 Height Weight  03/29/18 1613 112/61 98.4 F (36.9 C) Oral 98 15 100 % 5\' 4"  (1.626 m) 87.1 kg    Results for orders placed or performed during the hospital encounter of 03/29/18 (from the past 24 hour(s))  Urinalysis, Routine w reflex microscopic     Status: None   Collection Time: 03/29/18  4:27 PM  Result Value Ref Range   Color, Urine YELLOW YELLOW   APPearance CLEAR CLEAR   Specific Gravity, Urine 1.010 1.005 - 1.030   pH 7.0 5.0 - 8.0   Glucose,  UA NEGATIVE NEGATIVE mg/dL   Hgb urine dipstick NEGATIVE NEGATIVE   Bilirubin Urine NEGATIVE NEGATIVE   Ketones, ur NEGATIVE NEGATIVE mg/dL   Protein, ur NEGATIVE NEGATIVE mg/dL   Nitrite NEGATIVE NEGATIVE   Leukocytes, UA NEGATIVE NEGATIVE  Wet prep, genital     Status: Abnormal   Collection Time: 03/29/18  4:41 PM  Result Value Ref Range   Yeast Wet Prep HPF POC NONE SEEN NONE SEEN   Trich, Wet Prep NONE SEEN NONE SEEN   Clue Cells Wet Prep HPF POC NONE SEEN NONE SEEN   WBC, Wet Prep HPF POC FEW (A) NONE SEEN   Sperm NONE SEEN     Meds ordered this encounter  Medications  . terconazole (TERAZOL 7) 0.4 % vaginal cream    Sig: Place 1 applicator vaginally at bedtime. Use for seven days    Dispense:  45 g    Refill:  0    Order Specific Question:   Supervising Provider    Answer:   Reva BoresPRATT, TANYA S [[id number]]    Assessment and Plan  --32 y.o. G3P0020 at 3426w4d  --Vulvar and vaginal irritation with thick discharge, treat for yeast infection. Patient declined rx for external irritation --Reactive fetal tracing --Discharge home in stable condition  [facility]  Calvert CantorSamantha C Pierce Biagini, CNM 03/29/2018, 5:31 PM

## 2018-03-29 NOTE — MAU Note (Signed)
Seen in MD office yesterday for vaginal pain, vaginal spotting. Was told they would call her with results but pt states the pain is worsening.

## 2018-03-30 LAB — URINE CULTURE

## 2018-04-01 ENCOUNTER — Ambulatory Visit (HOSPITAL_COMMUNITY)
Admission: RE | Admit: 2018-04-01 | Discharge: 2018-04-01 | Disposition: A | Discharge status: Home / Self Care | Payer: BC Managed Care – PPO | Source: Ambulatory Visit | Attending: Obstetrics and Gynecology | Admitting: Obstetrics and Gynecology

## 2018-04-01 DIAGNOSIS — Z362 Encounter for other antenatal screening follow-up: Secondary | ICD-10-CM | POA: Diagnosis present

## 2018-04-01 DIAGNOSIS — Z3A28 28 weeks gestation of pregnancy: Secondary | ICD-10-CM

## 2018-04-01 DIAGNOSIS — O4443 Low lying placenta NOS or without hemorrhage, third trimester: Secondary | ICD-10-CM

## 2018-04-01 DIAGNOSIS — O09293 Supervision of pregnancy with other poor reproductive or obstetric history, third trimester: Secondary | ICD-10-CM

## 2018-04-03 NOTE — L&D Delivery Note (Signed)
Operative Delivery Note Pt pushed well x 1 hour. Variables present. 15x15 accels. Mod variability. At 3:37 PM a viable female was delivered via Vaginal, Spontaneous.  Presentation: vertex; Position: Right,, Occiput,, Anterior Delivery of the head: 1534. Resident attempted delivery of head with gentle downward traction, but was unable to deliver anterior shoulder. CNM took over delivery--was also unable to delivery anterior shoulder w/ McRoberts. Care was taken to avoid any traction on fetal head.  Suprapubic pressure applied by RN. Attempted to access the posterior arm, but did not have any room to reach it so midline episiotomy was cut. Able to hook posterior axilla (right) and make some progress w/ posterior shoulder, but not able to deliver body. Now at 1 minute. Dr. Adrian Blackwater call. NICU team called. CNM rotated posterior shoulder clockwise resulting in more progress w/ posterior arm. Unable to rotate counterclockwise or reach anterior axilla to aid wood screw maneuver. Tried sweeping posterior arm, but could not reach. Enlarged epis. Anterior shoulder not felt to still be entrapped behind pubic bone. Right leg crossed over body and Dr. Adrian Blackwater took over delivery. 3:37 PM body delivered by Dr. Adrian Blackwater. Cord clamped x 2 and cut immediately and infant taken to warmer by Dr. Adrian Blackwater with NICU team awaiting. See NICU note.   First maneuver: 06/18/2018  3:34 PM, Suprapubic Pressure McRoberts Second maneuver: 06/18/2018  3:35 PM, Woods screw, Posterior shoulder partially delivered Third maneuver: 06/18/2018  3:36 PM, Side-lying, Posterior arm delivered  3:37 PM body delivered  APGAR: 2, 5, 7; weight 7 lb 12.2 oz (3520 g).   Pitocin bolus started.    Placenta status: spontaneous, intact.  Cord:  with the following complications: None. 3VC Cord pH: 7.316  Anesthesia: Episiotomy Episiotomy: Median Lacerations: 2nd degree;Perineal Suture Repair: 3.0 vicryl rapide Est. Blood Loss (mL): 448  Mom to  postpartum.  Baby to NICU for transitional care. Discussed details of shoulder dystocia with pt. Neonatologist at [facility] giving update on baby.   Dorathy Kinsman 06/18/2018, 5:17 PM

## 2018-04-04 ENCOUNTER — Other Ambulatory Visit: Payer: Self-pay

## 2018-04-04 ENCOUNTER — Encounter: Payer: Self-pay | Admitting: Obstetrics and Gynecology

## 2018-04-04 ENCOUNTER — Ambulatory Visit (INDEPENDENT_AMBULATORY_CARE_PROVIDER_SITE_OTHER): Payer: BC Managed Care – PPO | Admitting: Obstetrics and Gynecology

## 2018-04-04 ENCOUNTER — Other Ambulatory Visit: Payer: BC Managed Care – PPO

## 2018-04-04 ENCOUNTER — Ambulatory Visit (HOSPITAL_COMMUNITY): Payer: BC Managed Care – PPO

## 2018-04-04 VITALS — BP 125/77 | HR 109 | Wt 189.6 lb

## 2018-04-04 DIAGNOSIS — Z23 Encounter for immunization: Secondary | ICD-10-CM

## 2018-04-04 DIAGNOSIS — O4443 Low lying placenta NOS or without hemorrhage, third trimester: Secondary | ICD-10-CM

## 2018-04-04 DIAGNOSIS — Z3482 Encounter for supervision of other normal pregnancy, second trimester: Secondary | ICD-10-CM

## 2018-04-04 DIAGNOSIS — Z3483 Encounter for supervision of other normal pregnancy, third trimester: Secondary | ICD-10-CM

## 2018-04-04 DIAGNOSIS — O4440 Low lying placenta NOS or without hemorrhage, unspecified trimester: Secondary | ICD-10-CM

## 2018-04-04 NOTE — Progress Notes (Signed)
   PRENATAL VISIT NOTE  Subjective:  Valerie Taylor is a 33 y.o. G3P0020 at [redacted]w[redacted]d being seen today for ongoing prenatal care.  She is currently monitored for the following issues for this low-risk pregnancy and has Supervision of normal pregnancy; History of ectopic pregnancy; History of pregnancy loss in prior pregnancy, currently pregnant; and Low-lying placenta on their problem list.  Patient reports no complaints.  Contractions: Not present. Vag. Bleeding: None.  Movement: Present. Denies leaking of fluid.   The following portions of the patient's history were reviewed and updated as appropriate: allergies, current medications, past family history, past medical history, past social history, past surgical history and problem list. Problem list updated.  Objective:   Vitals:   04/04/18 0818  BP: 125/77  Pulse: (!) 109  Weight: 189 lb 9.6 oz (86 kg)    Fetal Status: Fetal Heart Rate (bpm): 154   Movement: Present     General:  Alert, oriented and cooperative. Patient is in no acute distress.  Skin: Skin is warm and dry. No rash noted.   Cardiovascular: Normal heart rate noted  Respiratory: Normal respiratory effort, no problems with respiration noted  Abdomen: Soft, gravid, appropriate for gestational age.  Pain/Pressure: Absent     Pelvic: Cervical exam deferred        Extremities: Normal range of motion.  Edema: None  Mental Status: Normal mood and affect. Normal behavior. Normal judgment and thought content.   Assessment and Plan:  Pregnancy: G3P0020 at [redacted]w[redacted]d  1. Encounter for supervision of other normal pregnancy in second trimester - Glucose Tolerance, 2 Hours w/1 Hour - CBC - HIV antibody (with reflex) - RPR - Tdap today  2. Low-lying placenta Resolved on MFM [place] 04/01/18  Preterm labor symptoms and general obstetric precautions including but not limited to vaginal bleeding, contractions, leaking of fluid and fetal movement were reviewed in detail with the  patient. Please refer to After Visit Summary for other counseling recommendations.  Return in about 2 weeks (around 04/18/2018) for OB visit.  Future Appointments  Date Time Provider Department Center  04/04/2018  9:15 AM Conan Bowens, MD [facility] None    Conan Bowens, MD

## 2018-04-04 NOTE — Progress Notes (Addendum)
ROB/GTT/TDAP.  TDAP given in RD, tolerated well.  LOT [id number]  Exp.03/05/20  NDC 50277-412-87

## 2018-04-05 LAB — GLUCOSE TOLERANCE, 2 HOURS W/ 1HR
GLUCOSE, 1 HOUR: 156 mg/dL (ref 65–179)
Glucose, 2 hour: 103 mg/dL (ref 65–152)
Glucose, Fasting: 86 mg/dL (ref 65–91)

## 2018-04-05 LAB — CBC
HEMATOCRIT: 32.3 % — AB (ref 34.0–46.6)
HEMOGLOBIN: 10.9 g/dL — AB (ref 11.1–15.9)
MCH: 30.6 pg (ref 26.6–33.0)
MCHC: 33.7 g/dL (ref 31.5–35.7)
MCV: 91 fL (ref 79–97)
Platelets: 367 10*3/uL (ref 150–450)
RBC: 3.56 x10E6/uL — ABNORMAL LOW (ref 3.77–5.28)
RDW: 12.2 % — ABNORMAL LOW (ref 12.3–15.4)
WBC: 9.4 10*3/uL (ref 3.4–10.8)

## 2018-04-05 LAB — HIV ANTIBODY (ROUTINE TESTING W REFLEX): HIV SCREEN 4TH GENERATION: NONREACTIVE

## 2018-04-05 LAB — RPR: RPR Ser Ql: NONREACTIVE

## 2018-04-19 ENCOUNTER — Encounter: Payer: Self-pay | Admitting: Medical

## 2018-04-19 ENCOUNTER — Ambulatory Visit (INDEPENDENT_AMBULATORY_CARE_PROVIDER_SITE_OTHER): Payer: BC Managed Care – PPO | Admitting: Medical

## 2018-04-19 VITALS — BP 129/80 | HR 101 | Wt 188.6 lb

## 2018-04-19 DIAGNOSIS — Z3483 Encounter for supervision of other normal pregnancy, third trimester: Secondary | ICD-10-CM

## 2018-04-19 DIAGNOSIS — O4443 Low lying placenta NOS or without hemorrhage, third trimester: Secondary | ICD-10-CM

## 2018-04-19 DIAGNOSIS — Z3482 Encounter for supervision of other normal pregnancy, second trimester: Secondary | ICD-10-CM

## 2018-04-19 DIAGNOSIS — O4440 Low lying placenta NOS or without hemorrhage, unspecified trimester: Secondary | ICD-10-CM

## 2018-04-19 DIAGNOSIS — Z3A30 30 weeks gestation of pregnancy: Secondary | ICD-10-CM

## 2018-04-19 NOTE — Patient Instructions (Addendum)
Signs and Symptoms of Labor Labor is your body's natural process of moving your baby, placenta, and umbilical cord out of your uterus. The process of labor usually starts when your baby is full-term, between 37 and 40 weeks of pregnancy. How will I know when I am close to going into labor? As your body prepares for labor and the birth of your baby, you may notice the following symptoms in the weeks and days before true labor starts:  Having a strong desire to get your home ready to receive your new baby. This is called nesting. Nesting may be a sign that labor is approaching, and it may occur several weeks before birth. Nesting may involve cleaning and organizing your home.  Passing a small amount of thick, bloody mucus out of your vagina (normal bloody show or losing your mucus plug). This may happen more than a week before labor begins, or it might occur right before labor begins as the opening of the cervix starts to widen (dilate). For some women, the entire mucus plug passes at once. For others, smaller portions of the mucus plug may gradually pass over several days.  Your baby moving (dropping) lower in your pelvis to get into position for birth (lightening). When this happens, you may feel more pressure on your bladder and pelvic bone and less pressure on your ribs. This may make it easier to breathe. It may also cause you to need to urinate more often and have problems with bowel movements.  Having "practice contractions" (Braxton Hicks contractions) that occur at irregular (unevenly spaced) intervals that are more than 10 minutes apart. This is also called false labor. False labor contractions are common after exercise or sexual activity, and they will stop if you change position, rest, or drink fluids. These contractions are usually mild and do not get stronger over time. They may feel like: ? A backache or back pain. ? Mild cramps, similar to menstrual cramps. ? Tightening or pressure in  your abdomen. Other early symptoms that labor may be starting soon include:  Nausea or loss of appetite.  Diarrhea.  Having a sudden burst of energy, or feeling very tired.  Mood changes.  Having trouble sleeping. How will I know when labor has begun? Signs that true labor has begun may include:  Having contractions that come at regular (evenly spaced) intervals and increase in intensity. This may feel like more intense tightening or pressure in your abdomen that moves to your back. ? Contractions may also feel like rhythmic pain in your upper thighs or back that comes and goes at regular intervals. ? For first-time mothers, this change in intensity of contractions often occurs at a more gradual pace. ? Women who have given birth before may notice a more rapid progression of contraction changes.  Having a feeling of pressure in the vaginal area.  Your water breaking (rupture of membranes). This is when the sac of fluid that surrounds your baby breaks. When this happens, you will notice fluid leaking from your vagina. This may be clear or blood-tinged. Labor usually starts within 24 hours of your water breaking, but it may take longer to begin. ? Some women notice this as a gush of fluid. ? Others notice that their underwear repeatedly becomes damp. Follow these instructions at home:   When labor starts, or if your water breaks, call your health care provider or nurse care line. Based on your situation, they will determine when you should go in for an   exam.  When you are in early labor, you may be able to rest and manage symptoms at home. Some strategies to try at home include: ? Breathing and relaxation techniques. ? Taking a warm bath or shower. ? Listening to music. ? Using a heating pad on the lower back for pain. If you are directed to use heat:  Place a towel between your skin and the heat source.  Leave the heat on for 20-30 minutes.  Remove the heat if your skin turns  bright red. This is especially important if you are unable to feel pain, heat, or cold. You may have a greater risk of getting burned. Get help right away if:  You have painful, regular contractions that are 5 minutes apart or less.  Labor starts before you are [redacted] weeks along in your pregnancy.  You have a fever.  You have a headache that does not go away.  You have bright red blood coming from your vagina.  You do not feel your baby moving.  You have a sudden onset of: ? Severe headache with vision problems. ? Nausea, vomiting, or diarrhea. ? Chest pain or shortness of breath. These symptoms may be an emergency. If your health care provider recommends that you go to the hospital or birth center where you plan to deliver, do not drive yourself. Have someone else drive you, or call emergency services (911 in the [place] Summary  Labor is your body's natural process of moving your baby, placenta, and umbilical cord out of your uterus.  The process of labor usually starts when your baby is full-term, between 89 and 40 weeks of pregnancy.  When labor starts, or if your water breaks, call your health care provider or nurse care line. Based on your situation, they will determine when you should go in for an exam. This information is not intended to replace advice given to you by your health care provider. Make sure you discuss any questions you have with your health care provider. Document Released: 08/25/2016 Document Revised: 08/25/2016 Document Reviewed: 08/25/2016 Elsevier Interactive Patient Education  2019 [employer] Fetal Movement Counts Patient Name: ________________________________________________ Patient Due Date: ____________________ What is a fetal movement count?  A fetal movement count is the number of times that you feel your baby move during a certain amount of time. This may also be called a fetal kick count. A fetal movement count is recommended for every pregnant  woman. You may be asked to start counting fetal movements as early as week 28 of your pregnancy. Pay attention to when your baby is most active. You may notice your baby's sleep and wake cycles. You may also notice things that make your baby move more. You should do a fetal movement count:  When your baby is normally most active.  At the same time each day. A good time to count movements is while you are resting, after having something to eat and drink. How do I count fetal movements? 1. Find a quiet, comfortable area. Sit, or lie down on your side. 2. Write down the date, the start time and stop time, and the number of movements that you felt between those two times. Take this information with you to your health care visits. 3. For 2 hours, count kicks, flutters, swishes, rolls, and jabs. You should feel at least 10 movements during 2 hours. 4. You may stop counting after you have felt 10 movements. 5. If you do not feel 10 movements in 2  hours, have something to eat and drink. Then, keep resting and counting for 1 hour. If you feel at least 4 movements during that hour, you may stop counting. Contact a health care provider if:  You feel fewer than 4 movements in 2 hours.  Your baby is not moving like he or she usually does. Date: ____________ Start time: ____________ Stop time: ____________ Movements: ____________ Date: ____________ Start time: ____________ Stop time: ____________ Movements: ____________ Date: ____________ Start time: ____________ Stop time: ____________ Movements: ____________ Date: ____________ Start time: ____________ Stop time: ____________ Movements: ____________ Date: ____________ Start time: ____________ Stop time: ____________ Movements: ____________ Date: ____________ Start time: ____________ Stop time: ____________ Movements: ____________ Date: ____________ Start time: ____________ Stop time: ____________ Movements: ____________ Date: ____________ Start time:  ____________ Stop time: ____________ Movements: ____________ Date: ____________ Start time: ____________ Stop time: ____________ Movements: ____________ This information is not intended to replace advice given to you by your health care provider. Make sure you discuss any questions you have with your health care provider. Document Released: 04/19/2006 Document Revised: 11/17/2015 Document Reviewed: 04/29/2015 Elsevier Interactive Patient Education  2019 [employer]  Food Choices for Gastroesophageal Reflux Disease, Adult When you have gastroesophageal reflux disease (GERD), the foods you eat and your eating habits are very important. Choosing the right foods can help ease your discomfort. Think about working with a nutrition specialist (dietitian) to help you make good choices. What are tips for following this plan?  Meals  Choose healthy foods that are low in fat, such as fruits, vegetables, whole grains, low-fat dairy products, and lean meat, fish, and poultry.  Eat small meals often instead of 3 large meals a day. Eat your meals slowly, and in a place where you are relaxed. Avoid bending over or lying down until 2-3 hours after eating.  Avoid eating meals 2-3 hours before bed.  Avoid drinking a lot of liquid with meals.  Cook foods using methods other than frying. Bake, grill, or broil food instead.  Avoid or limit: ? Chocolate. ? Peppermint or spearmint. ? Alcohol. ? Pepper. ? Black and decaffeinated coffee. ? Black and decaffeinated tea. ? Bubbly (carbonated) soft drinks. ? Caffeinated energy drinks and soft drinks.  Limit high-fat foods such as: ? Fatty meat or fried foods. ? Whole milk, cream, butter, or ice cream. ? Nuts and nut butters. ? Pastries, donuts, and sweets made with butter or shortening.  Avoid foods that cause symptoms. These foods may be different for everyone. Common foods that cause symptoms include: ? Tomatoes. ? Oranges, lemons, and  limes. ? Peppers. ? Spicy food. ? Onions and garlic. ? Vinegar. Lifestyle  Maintain a healthy weight. Ask your doctor what weight is healthy for you. If you need to lose weight, work with your doctor to do so safely.  Exercise for at least 30 minutes for 5 or more days each week, or as told by your doctor.  Wear loose-fitting clothes.  Do not smoke. If you need help quitting, ask your doctor.  Sleep with the head of your bed higher than your feet. Use a wedge under the mattress or blocks under the bed frame to raise the head of the bed. Summary  When you have gastroesophageal reflux disease (GERD), food and lifestyle choices are very important in easing your symptoms.  Eat small meals often instead of 3 large meals a day. Eat your meals slowly, and in a place where you are relaxed.  Limit high-fat foods such as fatty meat or  fried foods.  Avoid bending over or lying down until 2-3 hours after eating.  Avoid peppermint and spearmint, caffeine, alcohol, and chocolate. This information is not intended to replace advice given to you by your health care provider. Make sure you discuss any questions you have with your health care provider. Document Released: 09/19/2011 Document Revised: 04/25/2016 Document Reviewed: 04/25/2016 Elsevier Interactive Patient Education  2019 [employer].  [place] Diet A bland diet consists of foods that are often soft and do not have a lot of fat, fiber, or extra seasonings. Foods without fat, fiber, or seasoning are easier for the body to digest. They are also less likely to irritate your mouth, throat, stomach, and other parts of your digestive system. A bland diet is sometimes called a BRAT diet. What is my plan? Your health care provider or food and nutrition specialist (dietitian) may recommend specific changes to your diet to prevent symptoms or to treat your symptoms. These changes may include:  Eating small meals often.  Cooking food until it is  soft enough to chew easily.  Chewing your food well.  Drinking fluids slowly.  Not eating foods that are very spicy, sour, or fatty.  Not eating citrus fruits, such as oranges and grapefruit. What do I need to know about this diet?  Eat a variety of foods from the bland diet food list.  Do not follow a bland diet longer than needed.  Ask your health care provider whether you should take vitamins or supplements. What foods can I eat? Grains  Hot cereals, such as cream of wheat. Rice. Bread, crackers, or tortillas made from refined white flour. Vegetables Canned or cooked vegetables. Mashed or boiled potatoes. Fruits  Bananas. Applesauce. Other types of cooked or canned fruit with the skin and seeds removed, such as canned peaches or pears. Meats and other proteins  Scrambled eggs. Creamy peanut butter or other nut butters. Lean, well-cooked meats, such as chicken or fish. Tofu. Soups or broths. Dairy Low-fat dairy products, such as milk, cottage cheese, or yogurt. Beverages  Water. Herbal tea. Apple juice. Fats and oils Mild salad dressings. Canola or olive oil. Sweets and desserts Pudding. Custard. Fruit gelatin. Ice cream. The items listed above may not be a complete list of recommended foods and beverages. Contact a dietitian for more options. What foods are not recommended? Grains Whole grain breads and cereals. Vegetables Raw vegetables. Fruits Raw fruits, especially citrus, berries, or dried fruits. Dairy Whole fat dairy foods. Beverages Caffeinated drinks. Alcohol. Seasonings and condiments Strongly flavored seasonings or condiments. Hot sauce. Salsa. Other foods Spicy foods. Fried foods. Sour foods, such as pickled or fermented foods. Foods with high sugar content. Foods high in fiber. The items listed above may not be a complete list of foods and beverages to avoid. Contact a dietitian for more information. Summary  A bland diet consists of foods that  are often soft and do not have a lot of fat, fiber, or extra seasonings.  Foods without fat, fiber, or seasoning are easier for the body to digest.  Check with your health care provider to see how long you should follow this diet plan. It is not meant to be followed for long periods. This information is not intended to replace advice given to you by your health care provider. Make sure you discuss any questions you have with your health care provider. Document Released: 07/12/2015 Document Revised: 04/18/2017 Document Reviewed: 04/18/2017 Elsevier Interactive Patient Education  2019 [employer].

## 2018-04-19 NOTE — Progress Notes (Signed)
Pt presents for ROB without complaints today.  

## 2018-04-19 NOTE — Progress Notes (Signed)
   PRENATAL VISIT NOTE  Subjective:  Valerie Taylor is a 33 y.o. G3P0020 at [redacted]w[redacted]d being seen today for ongoing prenatal care.  She is currently monitored for the following issues for this low-risk pregnancy and has Supervision of normal pregnancy; History of ectopic pregnancy; History of pregnancy loss in prior pregnancy, currently pregnant; and Low-lying placenta on their problem list.  Patient reports nausea and vomiting.  Contractions: Not present. Vag. Bleeding: None.  Movement: Present. Denies leaking of fluid.   The following portions of the patient's history were reviewed and updated as appropriate: allergies, current medications, past family history, past medical history, past social history, past surgical history and problem list. Problem list updated.  Objective:   Vitals:   04/19/18 0916  BP: 129/80  Pulse: (!) 101  Weight: 188 lb 9.6 oz (85.5 kg)    Fetal Status: Fetal Heart Rate (bpm): 138  Fundal Height: 31 cm Movement: Present     General:  Alert, oriented and cooperative. Patient is in no acute distress.  Skin: Skin is warm and dry. No rash noted.   Cardiovascular: Normal heart rate noted  Respiratory: Normal respiratory effort, no problems with respiration noted  Abdomen: Soft, gravid, appropriate for gestational age.  Pain/Pressure: Absent     Pelvic: Cervical exam deferred        Extremities: Normal range of motion.  Edema: None  Mental Status: Normal mood and affect. Normal behavior. Normal judgment and thought content.   Assessment and Plan:  Pregnancy: G3P0020 at [redacted]w[redacted]d  1. Encounter for supervision of other normal pregnancy in second trimester - Having intermittent N/V - GERD diet given - Offered anti-emetics, declines at this time - Advised Ensure for proper nutrition supplementation   2. Low-lying placenta - Resolved at last [place]  Preterm labor symptoms and general obstetric precautions including but not limited to vaginal bleeding, contractions,  leaking of fluid and fetal movement were reviewed in detail with the patient. Please refer to After Visit Summary for other counseling recommendations.  Return in about 2 weeks (around 05/03/2018) for LOB.  No future appointments.  Vonzella Nipple, PA-C

## 2018-04-29 ENCOUNTER — Telehealth: Payer: Self-pay

## 2018-04-29 DIAGNOSIS — N898 Other specified noninflammatory disorders of vagina: Secondary | ICD-10-CM

## 2018-04-29 DIAGNOSIS — B3731 Acute candidiasis of vulva and vagina: Secondary | ICD-10-CM

## 2018-04-29 DIAGNOSIS — B373 Candidiasis of vulva and vagina: Secondary | ICD-10-CM

## 2018-04-29 MED ORDER — TERCONAZOLE 0.8 % VA CREA: 1.0000 | TOPICAL_CREAM | Freq: Every day | VAGINAL | 0 refills | Status: DC

## 2018-04-29 NOTE — Telephone Encounter (Signed)
Pt called and states she finished a full course of antibiotics and now feels like she has a yeast infection. Pt has c/o discharge, itching and irritation for the past 2 days. I advised pt that I would send her Terconazole to her pharmacy per protocol. Pt verbalized understanding and states that she has an appointment on Friday, I advised her to let us know if her symptoms have not resolved by then.

## 2018-05-03 ENCOUNTER — Ambulatory Visit (INDEPENDENT_AMBULATORY_CARE_PROVIDER_SITE_OTHER): Payer: BC Managed Care – PPO | Admitting: Medical

## 2018-05-03 ENCOUNTER — Encounter: Payer: Self-pay | Admitting: Medical

## 2018-05-03 ENCOUNTER — Other Ambulatory Visit: Payer: Self-pay

## 2018-05-03 ENCOUNTER — Other Ambulatory Visit (HOSPITAL_COMMUNITY)
Admission: RE | Admit: 2018-05-03 | Discharge: 2018-05-03 | Disposition: A | Discharge status: Home / Self Care | Payer: BC Managed Care – PPO | Source: Ambulatory Visit | Attending: Medical | Admitting: Medical

## 2018-05-03 VITALS — BP 120/82 | HR 120 | Temp 98.5°F | Wt 189.0 lb

## 2018-05-03 DIAGNOSIS — Z3483 Encounter for supervision of other normal pregnancy, third trimester: Secondary | ICD-10-CM

## 2018-05-03 DIAGNOSIS — R102 Pelvic and perineal pain: Secondary | ICD-10-CM | POA: Diagnosis present

## 2018-05-03 DIAGNOSIS — O4440 Low lying placenta NOS or without hemorrhage, unspecified trimester: Secondary | ICD-10-CM

## 2018-05-03 DIAGNOSIS — O4443 Low lying placenta NOS or without hemorrhage, third trimester: Secondary | ICD-10-CM

## 2018-05-03 MED ORDER — TRIAMCINOLONE ACETONIDE 0.1 % EX CREA: 1.0000 "application " | TOPICAL_CREAM | Freq: Two times a day (BID) | CUTANEOUS | 0 refills | Status: DC

## 2018-05-03 NOTE — Progress Notes (Signed)
   PRENATAL VISIT NOTE  Subjective:  Valerie Taylor is a 33 y.o. G3P0020 at [redacted]w[redacted]d being seen today for ongoing prenatal care.  She is currently monitored for the following issues for this low-risk pregnancy and has Supervision of normal pregnancy; History of ectopic pregnancy; History of pregnancy loss in prior pregnancy, currently pregnant; and Low-lying placenta on their problem list.  Patient reports vaginal irritation.  Contractions: Not present. Vag. Bleeding: None.  Movement: Present. Denies leaking of fluid.   The following portions of the patient's history were reviewed and updated as appropriate: allergies, current medications, past family history, past medical history, past social history, past surgical history and problem list. Problem list updated.  Objective:   Vitals:   05/03/18 0844  BP: 120/82  Pulse: (!) 120  Temp: 98.5 F (36.9 C)  Weight: 189 lb (85.7 kg)    Fetal Status: Fetal Heart Rate (bpm): 145 Fundal Height: 32 cm Movement: Present     General:  Alert, oriented and cooperative. Patient is in no acute distress.  Skin: Skin is warm and dry. No rash noted.   Cardiovascular: Normal heart rate noted  Respiratory: Normal respiratory effort, no problems with respiration noted  Abdomen: Soft, gravid, appropriate for gestational age.  Pain/Pressure: Present     Pelvic: Cervical exam performed Dilation: Closed Effacement (%): Thick Station: Ballotable  Extremities: Normal range of motion.  Edema: None  Mental Status: Normal mood and affect. Normal behavior. Normal judgment and thought content.   Assessment and Plan:  Pregnancy: G3P0020 at [redacted]w[redacted]d  1. Encounter for supervision of other normal pregnancy in third trimester  2. Low-lying placenta - Resolved on last [place]  3. Vaginal pain - Cervicovaginal ancillary only( [facility]) - triamcinolone cream (KENALOG) 0.1 %; Apply 1 application topically 2 (two) times daily.  Dispense: 30 g; Refill: 0  Preterm labor  symptoms and general obstetric precautions including but not limited to vaginal bleeding, contractions, leaking of fluid and fetal movement were reviewed in detail with the patient. Please refer to After Visit Summary for other counseling recommendations.  Return in about 2 weeks (around 05/17/2018) for LOB.   Vonzella Nipple, PA-C

## 2018-05-03 NOTE — Patient Instructions (Signed)
Signs and Symptoms of Labor  Labor is your body's natural process of moving your baby, placenta, and umbilical cord out of your uterus. The process of labor usually starts when your baby is full-term, between 37 and 40 weeks of pregnancy.  How will I know when I am close to going into labor?  As your body prepares for labor and the birth of your baby, you may notice the following symptoms in the weeks and days before true labor starts:   Having a strong desire to get your home ready to receive your new baby. This is called nesting. Nesting may be a sign that labor is approaching, and it may occur several weeks before birth. Nesting may involve cleaning and organizing your home.   Passing a small amount of thick, bloody mucus out of your vagina (normal bloody show or losing your mucus plug). This may happen more than a week before labor begins, or it might occur right before labor begins as the opening of the cervix starts to widen (dilate). For some women, the entire mucus plug passes at once. For others, smaller portions of the mucus plug may gradually pass over several days.   Your baby moving (dropping) lower in your pelvis to get into position for birth (lightening). When this happens, you may feel more pressure on your bladder and pelvic bone and less pressure on your ribs. This may make it easier to breathe. It may also cause you to need to urinate more often and have problems with bowel movements.   Having "practice contractions" (Braxton Hicks contractions) that occur at irregular (unevenly spaced) intervals that are more than 10 minutes apart. This is also called false labor. False labor contractions are common after exercise or sexual activity, and they will stop if you change position, rest, or drink fluids. These contractions are usually mild and do not get stronger over time. They may feel like:  ? A backache or back pain.  ? Mild cramps, similar to menstrual cramps.  ? Tightening or pressure in  your abdomen.  Other early symptoms that labor may be starting soon include:   Nausea or loss of appetite.   Diarrhea.   Having a sudden burst of energy, or feeling very tired.   Mood changes.   Having trouble sleeping.  How will I know when labor has begun?  Signs that true labor has begun may include:   Having contractions that come at regular (evenly spaced) intervals and increase in intensity. This may feel like more intense tightening or pressure in your abdomen that moves to your back.  ? Contractions may also feel like rhythmic pain in your upper thighs or back that comes and goes at regular intervals.  ? For first-time mothers, this change in intensity of contractions often occurs at a more gradual pace.  ? Women who have given birth before may notice a more rapid progression of contraction changes.   Having a feeling of pressure in the vaginal area.   Your water breaking (rupture of membranes). This is when the sac of fluid that surrounds your baby breaks. When this happens, you will notice fluid leaking from your vagina. This may be clear or blood-tinged. Labor usually starts within 24 hours of your water breaking, but it may take longer to begin.  ? Some women notice this as a gush of fluid.  ? Others notice that their underwear repeatedly becomes damp.  Follow these instructions at home:     When labor   starts, or if your water breaks, call your health care provider or nurse care line. Based on your situation, they will determine when you should go in for an exam.   When you are in early labor, you may be able to rest and manage symptoms at home. Some strategies to try at home include:  ? Breathing and relaxation techniques.  ? Taking a warm bath or shower.  ? Listening to music.  ? Using a heating pad on the lower back for pain. If you are directed to use heat:   Place a towel between your skin and the heat source.   Leave the heat on for 20-30 minutes.   Remove the heat if your skin turns  bright red. This is especially important if you are unable to feel pain, heat, or cold. You may have a greater risk of getting burned.  Get help right away if:   You have painful, regular contractions that are 5 minutes apart or less.   Labor starts before you are [redacted] weeks along in your pregnancy.   You have a fever.   You have a headache that does not go away.   You have bright red blood coming from your vagina.   You do not feel your baby moving.   You have a sudden onset of:  ? Severe headache with vision problems.  ? Nausea, vomiting, or diarrhea.  ? Chest pain or shortness of breath.  These symptoms may be an emergency. If your health care provider recommends that you go to the hospital or birth center where you plan to deliver, do not drive yourself. Have someone else drive you, or call emergency services (911 in the [place]  Summary   Labor is your body's natural process of moving your baby, placenta, and umbilical cord out of your uterus.   The process of labor usually starts when your baby is full-term, between 37 and 40 weeks of pregnancy.   When labor starts, or if your water breaks, call your health care provider or nurse care line. Based on your situation, they will determine when you should go in for an exam.  This information is not intended to replace advice given to you by your health care provider. Make sure you discuss any questions you have with your health care provider.  Document Released: 08/25/2016 Document Revised: 08/25/2016 Document Reviewed: 08/25/2016  Elsevier Interactive Patient Education  2019 [employer]  Fetal Movement Counts  Patient Name: ________________________________________________ Patient Due Date: ____________________  What is a fetal movement count?    A fetal movement count is the number of times that you feel your baby move during a certain amount of time. This may also be called a fetal kick count. A fetal movement count is recommended for every pregnant  woman. You may be asked to start counting fetal movements as early as week 28 of your pregnancy.  Pay attention to when your baby is most active. You may notice your baby's sleep and wake cycles. You may also notice things that make your baby move more. You should do a fetal movement count:   When your baby is normally most active.   At the same time each day.  A good time to count movements is while you are resting, after having something to eat and drink.  How do I count fetal movements?  1. Find a quiet, comfortable area. Sit, or lie down on your side.  2. Write down the date, the start   time and stop time, and the number of movements that you felt between those two times. Take this information with you to your health care visits.  3. For 2 hours, count kicks, flutters, swishes, rolls, and jabs. You should feel at least 10 movements during 2 hours.  4. You may stop counting after you have felt 10 movements.  5. If you do not feel 10 movements in 2 hours, have something to eat and drink. Then, keep resting and counting for 1 hour. If you feel at least 4 movements during that hour, you may stop counting.  Contact a health care provider if:   You feel fewer than 4 movements in 2 hours.   Your baby is not moving like he or she usually does.  Date: ____________ Start time: ____________ Stop time: ____________ Movements: ____________  Date: ____________ Start time: ____________ Stop time: ____________ Movements: ____________  Date: ____________ Start time: ____________ Stop time: ____________ Movements: ____________  Date: ____________ Start time: ____________ Stop time: ____________ Movements: ____________  Date: ____________ Start time: ____________ Stop time: ____________ Movements: ____________  Date: ____________ Start time: ____________ Stop time: ____________ Movements: ____________  Date: ____________ Start time: ____________ Stop time: ____________ Movements: ____________  Date: ____________ Start time:  ____________ Stop time: ____________ Movements: ____________  Date: ____________ Start time: ____________ Stop time: ____________ Movements: ____________  This information is not intended to replace advice given to you by your health care provider. Make sure you discuss any questions you have with your health care provider.  Document Released: 04/19/2006 Document Revised: 11/17/2015 Document Reviewed: 04/29/2015  Elsevier Interactive Patient Education  2019 [employer]

## 2018-05-03 NOTE — Progress Notes (Signed)
ROB.  C/o pain and yeast infection, burning and swelling; treatment did not work.

## 2018-05-06 LAB — CERVICOVAGINAL ANCILLARY ONLY
Bacterial vaginitis: NEGATIVE
CHLAMYDIA, DNA PROBE: NEGATIVE
Candida vaginitis: NEGATIVE
Neisseria Gonorrhea: NEGATIVE
TRICH (WINDOWPATH): NEGATIVE

## 2018-05-17 ENCOUNTER — Encounter: Payer: Self-pay | Admitting: Medical

## 2018-05-17 ENCOUNTER — Ambulatory Visit (INDEPENDENT_AMBULATORY_CARE_PROVIDER_SITE_OTHER): Payer: BC Managed Care – PPO | Admitting: Medical

## 2018-05-17 VITALS — BP 118/71 | HR 114 | Wt 192.0 lb

## 2018-05-17 DIAGNOSIS — F5083 Pica in adults: Secondary | ICD-10-CM

## 2018-05-17 DIAGNOSIS — K219 Gastro-esophageal reflux disease without esophagitis: Secondary | ICD-10-CM

## 2018-05-17 DIAGNOSIS — O4440 Low lying placenta NOS or without hemorrhage, unspecified trimester: Secondary | ICD-10-CM

## 2018-05-17 DIAGNOSIS — O99013 Anemia complicating pregnancy, third trimester: Secondary | ICD-10-CM

## 2018-05-17 DIAGNOSIS — O4443 Low lying placenta NOS or without hemorrhage, third trimester: Secondary | ICD-10-CM

## 2018-05-17 DIAGNOSIS — Z3A34 34 weeks gestation of pregnancy: Secondary | ICD-10-CM

## 2018-05-17 DIAGNOSIS — F5089 Other specified eating disorder: Secondary | ICD-10-CM

## 2018-05-17 DIAGNOSIS — Z3482 Encounter for supervision of other normal pregnancy, second trimester: Secondary | ICD-10-CM

## 2018-05-17 DIAGNOSIS — Z3483 Encounter for supervision of other normal pregnancy, third trimester: Secondary | ICD-10-CM

## 2018-05-17 MED ORDER — OMEPRAZOLE 20 MG PO CPDR: 20.0000 mg | DELAYED_RELEASE_CAPSULE | Freq: Every day | ORAL | 1 refills | Status: DC

## 2018-05-17 NOTE — Progress Notes (Signed)
   PRENATAL VISIT NOTE  Subjective:  Valerie Taylor is a 33 y.o. G3P0020 at [redacted]w[redacted]d being seen today for ongoing prenatal care.  She is currently monitored for the following issues for this low-risk pregnancy and has Supervision of normal pregnancy; History of ectopic pregnancy; History of pregnancy loss in prior pregnancy, currently pregnant; and Low-lying placenta on their problem list.  Patient reports nausea and vomiting.  Contractions: Not present. Vag. Bleeding: None.  Movement: Present. Denies leaking of fluid.   The following portions of the patient's history were reviewed and updated as appropriate: allergies, current medications, past family history, past medical history, past social history, past surgical history and problem list. Problem list updated.  Objective:   Vitals:   05/17/18 0840  BP: 118/71  Pulse: (!) 114  Weight: 192 lb (87.1 kg)    Fetal Status: Fetal Heart Rate (bpm): 141 Fundal Height: 33 cm Movement: Present     General:  Alert, oriented and cooperative. Patient is in no acute distress.  Skin: Skin is warm and dry. No rash noted.   Cardiovascular: Normal heart rate noted  Respiratory: Normal respiratory effort, no problems with respiration noted  Abdomen: Soft, gravid, appropriate for gestational age.  Pain/Pressure: Absent     Pelvic: Cervical exam deferred        Extremities: Normal range of motion.  Edema: None  Mental Status: Normal mood and affect. Normal behavior. Normal judgment and thought content.   Assessment and Plan:  Pregnancy: G3P0020 at [redacted]w[redacted]d  1. Encounter for supervision of other normal pregnancy in third trimester - Doing well  2. Low-lying placenta - resolved as of last [place] in December  3. 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 Gastroesophageal reflux disease without esophagitis - Diet discussed again  - omeprazole (PRILOSEC) 20 MG capsule; Take 1 capsule (20 mg total) by  mouth daily.  Dispense: 14 capsule; Refill: 1  Preterm labor symptoms and general obstetric precautions including but not limited to vaginal bleeding, contractions, leaking of fluid and fetal movement were reviewed in detail with the patient. Please refer to After Visit Summary for other counseling recommendations.  Return in about 2 weeks (around 05/31/2018) for LOB.   Vonzella Nipple, PA-C

## 2018-05-17 NOTE — Patient Instructions (Signed)
Fetal Movement Counts Patient Name: ________________________________________________ Patient Due Date: ____________________ What is a fetal movement count?  A fetal movement count is the number of times that you feel your baby move during a certain amount of time. This may also be called a fetal kick count. A fetal movement count is recommended for every pregnant woman. You may be asked to start counting fetal movements as early as week 28 of your pregnancy. Pay attention to when your baby is most active. You may notice your baby's sleep and wake cycles. You may also notice things that make your baby move more. You should do a fetal movement count:  When your baby is normally most active.  At the same time each day. A good time to count movements is while you are resting, after having something to eat and drink. How do I count fetal movements? 1. Find a quiet, comfortable area. Sit, or lie down on your side. 2. Write down the date, the start time and stop time, and the number of movements that you felt between those two times. Take this information with you to your health care visits. 3. For 2 hours, count kicks, flutters, swishes, rolls, and jabs. You should feel at least 10 movements during 2 hours. 4. You may stop counting after you have felt 10 movements. 5. If you do not feel 10 movements in 2 hours, have something to eat and drink. Then, keep resting and counting for 1 hour. If you feel at least 4 movements during that hour, you may stop counting. Contact a health care provider if:  You feel fewer than 4 movements in 2 hours.  Your baby is not moving like he or she usually does. Date: ____________ Start time: ____________ Stop time: ____________ Movements: ____________ Date: ____________ Start time: ____________ Stop time: ____________ Movements: ____________ Date: ____________ Start time: ____________ Stop time: ____________ Movements: ____________ Date: ____________ Start time:  ____________ Stop time: ____________ Movements: ____________ Date: ____________ Start time: ____________ Stop time: ____________ Movements: ____________ Date: ____________ Start time: ____________ Stop time: ____________ Movements: ____________ Date: ____________ Start time: ____________ Stop time: ____________ Movements: ____________ Date: ____________ Start time: ____________ Stop time: ____________ Movements: ____________ Date: ____________ Start time: ____________ Stop time: ____________ Movements: ____________ This information is not intended to replace advice given to you by your health care provider. Make sure you discuss any questions you have with your health care provider. Document Released: 04/19/2006 Document Revised: 11/17/2015 Document Reviewed: 04/29/2015 Elsevier Interactive Patient Education  2019 [employer] Braxton Hicks Contractions Contractions of the uterus can occur throughout pregnancy, but they are not always a sign that you are in labor. You may have practice contractions called Braxton Hicks contractions. These false labor contractions are sometimes confused with true labor. What are Braxton Hicks contractions? Braxton Hicks contractions are tightening movements that occur in the muscles of the uterus before labor. Unlike true labor contractions, these contractions do not result in opening (dilation) and thinning of the cervix. Toward the end of pregnancy (32-34 weeks), Braxton Hicks contractions can happen more often and may become stronger. These contractions are sometimes difficult to tell apart from true labor because they can be very uncomfortable. You should not feel embarrassed if you go to the hospital with false labor. Sometimes, the only way to tell if you are in true labor is for your health care provider to look for changes in the cervix. The health care provider will do a physical exam and may monitor your contractions. If   you are not in true labor, the exam  should show that your cervix is not dilating and your water has not broken. If there are no other health problems associated with your pregnancy, it is completely safe for you to be sent home with false labor. You may continue to have Braxton Hicks contractions until you go into true labor. How to tell the difference between true labor and false labor True labor  Contractions last 30-70 seconds.  Contractions become very regular.  Discomfort is usually felt in the top of the uterus, and it spreads to the lower abdomen and low back.  Contractions do not go away with walking.  Contractions usually become more intense and increase in frequency.  The cervix dilates and gets thinner. False labor  Contractions are usually shorter and not as strong as true labor contractions.  Contractions are usually irregular.  Contractions are often felt in the front of the lower abdomen and in the groin.  Contractions may go away when you walk around or change positions while lying down.  Contractions get weaker and are shorter-lasting as time goes on.  The cervix usually does not dilate or become thin. Follow these instructions at home:   Take over-the-counter and prescription medicines only as told by your health care provider.  Keep up with your usual exercises and follow other instructions from your health care provider.  Eat and drink lightly if you think you are going into labor.  If Braxton Hicks contractions are making you uncomfortable: ? Change your position from lying down or resting to walking, or change from walking to resting. ? Sit and rest in a tub of warm water. ? Drink enough fluid to keep your urine pale yellow. Dehydration may cause these contractions. ? Do slow and deep breathing several times an hour.  Keep all follow-up prenatal visits as told by your health care provider. This is important. Contact a health care provider if:  You have a fever.  You have continuous  pain in your abdomen. Get help right away if:  Your contractions become stronger, more regular, and closer together.  You have fluid leaking or gushing from your vagina.  You pass blood-tinged mucus (bloody show).  You have bleeding from your vagina.  You have low back pain that you never had before.  You feel your baby's head pushing down and causing pelvic pressure.  Your baby is not moving inside you as much as it used to. Summary  Contractions that occur before labor are called Braxton Hicks contractions, false labor, or practice contractions.  Braxton Hicks contractions are usually shorter, weaker, farther apart, and less regular than true labor contractions. True labor contractions usually become progressively stronger and regular, and they become more frequent.  Manage discomfort from Braxton Hicks contractions by changing position, resting in a warm bath, drinking plenty of water, or practicing deep breathing. This information is not intended to replace advice given to you by your health care provider. Make sure you discuss any questions you have with your health care provider. Document Released: 08/03/2016 Document Revised: 01/02/2017 Document Reviewed: 08/03/2016 Elsevier Interactive Patient Education  2019 [employer] Inc[employer]  

## 2018-05-18 LAB — CBC
Hematocrit: 30.8 % — ABNORMAL LOW (ref 34.0–46.6)
Hemoglobin: 10.4 g/dL — ABNORMAL LOW (ref 11.1–15.9)
MCH: 30.3 pg (ref 26.6–33.0)
MCHC: 33.8 g/dL (ref 31.5–35.7)
MCV: 90 fL (ref 79–97)
PLATELETS: 329 10*3/uL (ref 150–450)
RBC: 3.43 x10E6/uL — ABNORMAL LOW (ref 3.77–5.28)
RDW: 12.4 % (ref 11.7–15.4)
WBC: 9 10*3/uL (ref 3.4–10.8)

## 2018-05-19 ENCOUNTER — Encounter (HOSPITAL_COMMUNITY): Payer: Self-pay | Admitting: *Deleted

## 2018-05-19 ENCOUNTER — Inpatient Hospital Stay (HOSPITAL_COMMUNITY)
Admission: AD | Admit: 2018-05-19 | Discharge: 2018-05-19 | Disposition: A | Discharge status: Home / Self Care | Payer: BC Managed Care – PPO | Source: Ambulatory Visit | Attending: Obstetrics and Gynecology | Admitting: Obstetrics and Gynecology

## 2018-05-19 DIAGNOSIS — Z3A34 34 weeks gestation of pregnancy: Secondary | ICD-10-CM | POA: Insufficient documentation

## 2018-05-19 DIAGNOSIS — O4703 False labor before 37 completed weeks of gestation, third trimester: Secondary | ICD-10-CM

## 2018-05-19 DIAGNOSIS — Z0371 Encounter for suspected problem with amniotic cavity and membrane ruled out: Secondary | ICD-10-CM

## 2018-05-19 DIAGNOSIS — Z3689 Encounter for other specified antenatal screening: Secondary | ICD-10-CM

## 2018-05-19 DIAGNOSIS — O4440 Low lying placenta NOS or without hemorrhage, unspecified trimester: Secondary | ICD-10-CM

## 2018-05-19 DIAGNOSIS — O26893 Other specified pregnancy related conditions, third trimester: Secondary | ICD-10-CM | POA: Diagnosis present

## 2018-05-19 LAB — URINALYSIS, ROUTINE W REFLEX MICROSCOPIC
Bilirubin Urine: NEGATIVE
Glucose, UA: NEGATIVE mg/dL
Hgb urine dipstick: NEGATIVE
Ketones, ur: NEGATIVE mg/dL
Leukocytes,Ua: NEGATIVE
Nitrite: NEGATIVE
Protein, ur: NEGATIVE mg/dL
Specific Gravity, Urine: 1.015 (ref 1.005–1.030)
pH: 6 (ref 5.0–8.0)

## 2018-05-19 LAB — POCT FERN TEST: POCT Fern Test: NEGATIVE

## 2018-05-19 LAB — AMNISURE RUPTURE OF MEMBRANE (ROM) NOT AT ARMC: Amnisure ROM: NEGATIVE

## 2018-05-19 MED ORDER — NIFEDIPINE 10 MG PO CAPS
10.0000 mg | ORAL_CAPSULE | ORAL | Status: DC | PRN
  Administered 2018-05-19 (×2): 10 mg via ORAL
  Filled 2018-05-19 (×2): qty 1

## 2018-05-19 NOTE — MAU Provider Note (Signed)
First Provider Initiated Contact with Patient 05/19/18 0250    S: Valerie Taylor is a 33 y.o. G3P0020 at [redacted]w[redacted]d  who presents to MAU today complaining of possibly leakinof fluid sig nce around 0100. She reports having intercourse a couple of hours prior to presenting to MAU, after IC went to restroom and "leaked puddle on floor", unsure of whether her water broke or she urinated on herself. Has not continued to leak and does not have to wear pad or panty liner. She denies vaginal bleeding. She endorses contractions. She reports normal fetal movement.    O: BP 117/63   Pulse (!) 103   Temp 97.6 F (36.4 C)   Resp 16   Ht 5\' 4"  (1.626 m)   Wt 87.1 kg   LMP 09/17/2017   BMI 32.96 kg/m  GENERAL: Well-developed, well-nourished female in no acute distress.  HEAD: Normocephalic, atraumatic.  CHEST: Normal effort of breathing, regular heart rate ABDOMEN: Soft, nontender, gravid PELVIC: Normal external female genitalia. Vagina is pink and rugated. Cervix with normal contour, no lesions. Normal discharge.  Negative pooling.   Cervical exam: no cervical change after reassessment  Dilation: 3 Effacement (%): 60 Cervical Position: Middle Station: -2 Presentation: Vertex Exam by:: Steward Drone CNM   Fetal Monitoring: Baseline: 125  Variability: moderate  Accelerations: present  Decelerations: none  Contractions: 6 minutes/ mild by palpation   Results for orders placed or performed during the hospital encounter of 05/19/18 (from the past 24 hour(s))  Urinalysis, Routine w reflex microscopic     Status: None   Collection Time: 05/19/18  2:21 AM  Result Value Ref Range   Color, Urine YELLOW YELLOW   APPearance CLEAR CLEAR   Specific Gravity, Urine 1.015 1.005 - 1.030   pH 6.0 5.0 - 8.0   Glucose, UA NEGATIVE NEGATIVE mg/dL   Hgb urine dipstick NEGATIVE NEGATIVE   Bilirubin Urine NEGATIVE NEGATIVE   Ketones, ur NEGATIVE NEGATIVE mg/dL   Protein, ur NEGATIVE NEGATIVE mg/dL   Nitrite NEGATIVE NEGATIVE   Leukocytes,Ua NEGATIVE NEGATIVE  Fern Test     Status: Normal   Collection Time: 05/19/18  2:40 AM  Result Value Ref Range   POCT Fern Test Negative = intact amniotic membranes   Amnisure rupture of membrane (rom)not at [facility]     Status: None   Collection Time: 05/19/18  2:53 AM  Result Value Ref Range   Amnisure ROM NEGATIVE    2 doses of Procardia given in MAU for maternal comfort and preterm uterine contractions, patient reports contractions have lessened and have spaced.   A: [facility] at [redacted]w[redacted]d  Membranes intact  Preterm uterine contractions  NST reactive   P: Discharge home  Follow up as scheduled for prenatal appointments Pelvic rest and Hydration   Sharyon Cable, CNM 05/19/2018 3:56 AM

## 2018-05-19 NOTE — MAU Note (Signed)
Leaked "puddle fld on floor" about an hour ago. Now feeling pelvic pain and pressure. No further leaking.

## 2018-05-19 NOTE — Progress Notes (Signed)
Steward Drone CNM in earlier to discuss d/c plan with pt and significant other. Written and verbal d/c instructions given and understanding voiced

## 2018-05-19 NOTE — Discharge Instructions (Signed)

## 2018-05-20 MED ORDER — FERROUS SULFATE 324 (65 FE) MG PO TBEC: 1.0000 | DELAYED_RELEASE_TABLET | Freq: Every day | ORAL | 3 refills | Status: DC

## 2018-05-20 NOTE — Addendum Note (Signed)
Addended by: Marny Lowenstein on: 05/20/2018 10:05 AM   Modules accepted: Orders

## 2018-05-22 ENCOUNTER — Ambulatory Visit: Payer: BC Managed Care – PPO

## 2018-05-22 ENCOUNTER — Other Ambulatory Visit (HOSPITAL_COMMUNITY)
Admission: RE | Admit: 2018-05-22 | Discharge: 2018-05-22 | Disposition: A | Discharge status: Home / Self Care | Payer: BC Managed Care – PPO | Source: Ambulatory Visit | Attending: Medical | Admitting: Medical

## 2018-05-22 DIAGNOSIS — N898 Other specified noninflammatory disorders of vagina: Secondary | ICD-10-CM | POA: Diagnosis present

## 2018-05-22 NOTE — Progress Notes (Signed)
Pt is here for self swab. C/o vaginal discharge and irritation for a couple days. Advised pt we will let her know the results.

## 2018-05-23 LAB — CERVICOVAGINAL ANCILLARY ONLY
BACTERIAL VAGINITIS: NEGATIVE
CHLAMYDIA, DNA PROBE: NEGATIVE
Candida vaginitis: POSITIVE — AB
Neisseria Gonorrhea: NEGATIVE
Trichomonas: NEGATIVE

## 2018-05-24 ENCOUNTER — Other Ambulatory Visit: Payer: Self-pay | Admitting: Medical

## 2018-05-24 DIAGNOSIS — B373 Candidiasis of vulva and vagina: Secondary | ICD-10-CM

## 2018-05-24 DIAGNOSIS — B3731 Acute candidiasis of vulva and vagina: Secondary | ICD-10-CM

## 2018-05-24 MED ORDER — TERCONAZOLE 0.8 % VA CREA: 1.0000 | TOPICAL_CREAM | Freq: Every day | VAGINAL | 0 refills | Status: DC

## 2018-05-31 ENCOUNTER — Ambulatory Visit (INDEPENDENT_AMBULATORY_CARE_PROVIDER_SITE_OTHER): Payer: BC Managed Care – PPO | Admitting: Medical

## 2018-05-31 ENCOUNTER — Other Ambulatory Visit (HOSPITAL_COMMUNITY)
Admission: RE | Admit: 2018-05-31 | Discharge: 2018-05-31 | Disposition: A | Discharge status: Home / Self Care | Payer: BC Managed Care – PPO | Source: Ambulatory Visit | Attending: Medical | Admitting: Medical

## 2018-05-31 ENCOUNTER — Encounter: Payer: Self-pay | Admitting: Medical

## 2018-05-31 VITALS — BP 113/75 | HR 103 | Wt 193.0 lb

## 2018-05-31 DIAGNOSIS — Z3483 Encounter for supervision of other normal pregnancy, third trimester: Secondary | ICD-10-CM | POA: Insufficient documentation

## 2018-05-31 DIAGNOSIS — O368131 Decreased fetal movements, third trimester, fetus 1: Secondary | ICD-10-CM | POA: Diagnosis not present

## 2018-05-31 DIAGNOSIS — Z3689 Encounter for other specified antenatal screening: Secondary | ICD-10-CM

## 2018-05-31 DIAGNOSIS — Z3A36 36 weeks gestation of pregnancy: Secondary | ICD-10-CM

## 2018-05-31 NOTE — Patient Instructions (Signed)
Fetal Movement Counts Patient Name: ________________________________________________ Patient Due Date: ____________________ What is a fetal movement count?  A fetal movement count is the number of times that you feel your baby move during a certain amount of time. This may also be called a fetal kick count. A fetal movement count is recommended for every pregnant woman. You may be asked to start counting fetal movements as early as week 28 of your pregnancy. Pay attention to when your baby is most active. You may notice your baby's sleep and wake cycles. You may also notice things that make your baby move more. You should do a fetal movement count:  When your baby is normally most active.  At the same time each day. A good time to count movements is while you are resting, after having something to eat and drink. How do I count fetal movements? 1. Find a quiet, comfortable area. Sit, or lie down on your side. 2. Write down the date, the start time and stop time, and the number of movements that you felt between those two times. Take this information with you to your health care visits. 3. For 2 hours, count kicks, flutters, swishes, rolls, and jabs. You should feel at least 10 movements during 2 hours. 4. You may stop counting after you have felt 10 movements. 5. If you do not feel 10 movements in 2 hours, have something to eat and drink. Then, keep resting and counting for 1 hour. If you feel at least 4 movements during that hour, you may stop counting. Contact a health care provider if:  You feel fewer than 4 movements in 2 hours.  Your baby is not moving like he or she usually does. Date: ____________ Start time: ____________ Stop time: ____________ Movements: ____________ Date: ____________ Start time: ____________ Stop time: ____________ Movements: ____________ Date: ____________ Start time: ____________ Stop time: ____________ Movements: ____________ Date: ____________ Start time:  ____________ Stop time: ____________ Movements: ____________ Date: ____________ Start time: ____________ Stop time: ____________ Movements: ____________ Date: ____________ Start time: ____________ Stop time: ____________ Movements: ____________ Date: ____________ Start time: ____________ Stop time: ____________ Movements: ____________ Date: ____________ Start time: ____________ Stop time: ____________ Movements: ____________ Date: ____________ Start time: ____________ Stop time: ____________ Movements: ____________ This information is not intended to replace advice given to you by your health care provider. Make sure you discuss any questions you have with your health care provider. Document Released: 04/19/2006 Document Revised: 11/17/2015 Document Reviewed: 04/29/2015 Elsevier Interactive Patient Education  2019 [employer] Braxton Hicks Contractions Contractions of the uterus can occur throughout pregnancy, but they are not always a sign that you are in labor. You may have practice contractions called Braxton Hicks contractions. These false labor contractions are sometimes confused with true labor. What are Braxton Hicks contractions? Braxton Hicks contractions are tightening movements that occur in the muscles of the uterus before labor. Unlike true labor contractions, these contractions do not result in opening (dilation) and thinning of the cervix. Toward the end of pregnancy (32-34 weeks), Braxton Hicks contractions can happen more often and may become stronger. These contractions are sometimes difficult to tell apart from true labor because they can be very uncomfortable. You should not feel embarrassed if you go to the hospital with false labor. Sometimes, the only way to tell if you are in true labor is for your health care provider to look for changes in the cervix. The health care provider will do a physical exam and may monitor your contractions. If   you are not in true labor, the exam  should show that your cervix is not dilating and your water has not broken. If there are no other health problems associated with your pregnancy, it is completely safe for you to be sent home with false labor. You may continue to have Braxton Hicks contractions until you go into true labor. How to tell the difference between true labor and false labor True labor  Contractions last 30-70 seconds.  Contractions become very regular.  Discomfort is usually felt in the top of the uterus, and it spreads to the lower abdomen and low back.  Contractions do not go away with walking.  Contractions usually become more intense and increase in frequency.  The cervix dilates and gets thinner. False labor  Contractions are usually shorter and not as strong as true labor contractions.  Contractions are usually irregular.  Contractions are often felt in the front of the lower abdomen and in the groin.  Contractions may go away when you walk around or change positions while lying down.  Contractions get weaker and are shorter-lasting as time goes on.  The cervix usually does not dilate or become thin. Follow these instructions at home:   Take over-the-counter and prescription medicines only as told by your health care provider.  Keep up with your usual exercises and follow other instructions from your health care provider.  Eat and drink lightly if you think you are going into labor.  If Braxton Hicks contractions are making you uncomfortable: ? Change your position from lying down or resting to walking, or change from walking to resting. ? Sit and rest in a tub of warm water. ? Drink enough fluid to keep your urine pale yellow. Dehydration may cause these contractions. ? Do slow and deep breathing several times an hour.  Keep all follow-up prenatal visits as told by your health care provider. This is important. Contact a health care provider if:  You have a fever.  You have continuous  pain in your abdomen. Get help right away if:  Your contractions become stronger, more regular, and closer together.  You have fluid leaking or gushing from your vagina.  You pass blood-tinged mucus (bloody show).  You have bleeding from your vagina.  You have low back pain that you never had before.  You feel your baby's head pushing down and causing pelvic pressure.  Your baby is not moving inside you as much as it used to. Summary  Contractions that occur before labor are called Braxton Hicks contractions, false labor, or practice contractions.  Braxton Hicks contractions are usually shorter, weaker, farther apart, and less regular than true labor contractions. True labor contractions usually become progressively stronger and regular, and they become more frequent.  Manage discomfort from Braxton Hicks contractions by changing position, resting in a warm bath, drinking plenty of water, or practicing deep breathing. This information is not intended to replace advice given to you by your health care provider. Make sure you discuss any questions you have with your health care provider. Document Released: 08/03/2016 Document Revised: 01/02/2017 Document Reviewed: 08/03/2016 Elsevier Interactive Patient Education  2019 [employer] Inc[employer]  

## 2018-05-31 NOTE — Progress Notes (Signed)
   PRENATAL VISIT NOTE  Subjective:  Valerie Taylor is a 33 y.o. G3P0020 at [redacted]w[redacted]d being seen today for ongoing prenatal care.  She is currently monitored for the following issues for this low-risk pregnancy and has Supervision of normal pregnancy; History of ectopic pregnancy; History of pregnancy loss in prior pregnancy, currently pregnant; and Low-lying placenta on their problem list.  Patient reports no complaints.  Contractions: Not present. Vag. Bleeding: None.  Movement: (!) Decreased. Denies leaking of fluid.   The following portions of the patient's history were reviewed and updated as appropriate: allergies, current medications, past family history, past medical history, past social history, past surgical history and problem list. Problem list updated.  Objective:   Vitals:   05/31/18 0948  BP: 113/75  Pulse: (!) 103  Weight: 193 lb (87.5 kg)    Fetal Status: Fetal Heart Rate (bpm): 135 Fundal Height: 36 cm Movement: (!) Decreased  Presentation: Vertex  General:  Alert, oriented and cooperative. Patient is in no acute distress.  Skin: Skin is warm and dry. No rash noted.   Cardiovascular: Normal heart rate noted  Respiratory: Normal respiratory effort, no problems with respiration noted  Abdomen: Soft, gravid, appropriate for gestational age.  Pain/Pressure: Absent     Pelvic: Cervical exam performed Dilation: 2 Effacement (%): Thick Station: -3  Extremities: Normal range of motion.  Edema: None  Mental Status: Normal mood and affect. Normal behavior. Normal judgment and thought content.   Assessment and Plan:  Pregnancy: G3P0020 at [redacted]w[redacted]d  1. Encounter for supervision of other normal pregnancy in third trimester - Culture, beta strep (group b only) - Cervicovaginal ancillary only( [place]) - Decreased FM since last visit, NST today - reactive  Fetal Monitoring: Baseline: 130 bpm Variability: moderate Accelerations: 15 x 15 Decelerations: none Contractions: q  9-10 minutes   Preterm labor symptoms and general obstetric precautions including but not limited to vaginal bleeding, contractions, leaking of fluid and fetal movement were reviewed in detail with the patient. Please refer to After Visit Summary for other counseling recommendations.  Return in about 1 week (around 06/07/2018) for LOB.  Vonzella Nipple, PA-C

## 2018-06-03 LAB — CERVICOVAGINAL ANCILLARY ONLY
Chlamydia: NEGATIVE
Neisseria Gonorrhea: NEGATIVE

## 2018-06-04 LAB — CULTURE, BETA STREP (GROUP B ONLY): Strep Gp B Culture: NEGATIVE

## 2018-06-07 ENCOUNTER — Ambulatory Visit (INDEPENDENT_AMBULATORY_CARE_PROVIDER_SITE_OTHER): Payer: BC Managed Care – PPO | Admitting: Medical

## 2018-06-07 ENCOUNTER — Other Ambulatory Visit: Payer: Self-pay

## 2018-06-07 VITALS — BP 125/76 | HR 100 | Wt 197.0 lb

## 2018-06-07 DIAGNOSIS — Z3483 Encounter for supervision of other normal pregnancy, third trimester: Secondary | ICD-10-CM

## 2018-06-07 DIAGNOSIS — Z3A37 37 weeks gestation of pregnancy: Secondary | ICD-10-CM

## 2018-06-07 NOTE — Patient Instructions (Signed)

## 2018-06-07 NOTE — Progress Notes (Signed)
   PRENATAL VISIT NOTE  Subjective:  Valerie Taylor is a 33 y.o. G3P0020 at [redacted]w[redacted]d being seen today for ongoing prenatal care.  She is currently monitored for the following issues for this low-risk pregnancy and has Supervision of normal pregnancy; History of ectopic pregnancy; History of pregnancy loss in prior pregnancy, currently pregnant; and Low-lying placenta on their problem list.  Patient reports no complaints.  Contractions: Not present. Vag. Bleeding: None.  Movement: Present. Denies leaking of fluid.   The following portions of the patient's history were reviewed and updated as appropriate: allergies, current medications, past family history, past medical history, past social history, past surgical history and problem list. Problem list updated.  Objective:   Vitals:   06/07/18 0938  BP: 125/76  Pulse: 100  Weight: 197 lb (89.4 kg)    Fetal Status: Fetal Heart Rate (bpm): 123 Fundal Height: 38 cm Movement: Present     General:  Alert, oriented and cooperative. Patient is in no acute distress.  Skin: Skin is warm and dry. No rash noted.   Cardiovascular: Normal heart rate noted  Respiratory: Normal respiratory effort, no problems with respiration noted  Abdomen: Soft, gravid, appropriate for gestational age.  Pain/Pressure: Absent     Pelvic: Cervical exam deferred        Extremities: Normal range of motion.  Edema: None  Mental Status: Normal mood and affect. Normal behavior. Normal judgment and thought content.   Assessment and Plan:  Pregnancy: G3P0020 at [redacted]w[redacted]d  1. Encounter for supervision of other normal pregnancy in third trimester - Doing well, just fatigued  Term labor symptoms and general obstetric precautions including but not limited to vaginal bleeding, contractions, leaking of fluid and fetal movement were reviewed in detail with the patient. Please refer to After Visit Summary for other counseling recommendations.  Return in about 1 week (around  06/14/2018) for LOB.   Vonzella Nipple, PA-C

## 2018-06-11 ENCOUNTER — Inpatient Hospital Stay (HOSPITAL_BASED_OUTPATIENT_CLINIC_OR_DEPARTMENT_OTHER): Payer: BC Managed Care – PPO

## 2018-06-11 ENCOUNTER — Telehealth: Payer: Self-pay

## 2018-06-11 ENCOUNTER — Inpatient Hospital Stay (HOSPITAL_COMMUNITY)
Admission: AD | Admit: 2018-06-11 | Discharge: 2018-06-11 | Disposition: A | Discharge status: Home / Self Care | Payer: BC Managed Care – PPO | Attending: Obstetrics and Gynecology | Admitting: Obstetrics and Gynecology

## 2018-06-11 ENCOUNTER — Encounter (HOSPITAL_COMMUNITY): Payer: Self-pay | Admitting: *Deleted

## 2018-06-11 DIAGNOSIS — O26893 Other specified pregnancy related conditions, third trimester: Secondary | ICD-10-CM | POA: Diagnosis not present

## 2018-06-11 DIAGNOSIS — Z3689 Encounter for other specified antenatal screening: Secondary | ICD-10-CM

## 2018-06-11 DIAGNOSIS — Z3A38 38 weeks gestation of pregnancy: Secondary | ICD-10-CM | POA: Diagnosis not present

## 2018-06-11 DIAGNOSIS — O4693 Antepartum hemorrhage, unspecified, third trimester: Secondary | ICD-10-CM | POA: Insufficient documentation

## 2018-06-11 DIAGNOSIS — R000 Tachycardia, unspecified: Secondary | ICD-10-CM | POA: Diagnosis not present

## 2018-06-11 DIAGNOSIS — O368130 Decreased fetal movements, third trimester, not applicable or unspecified: Secondary | ICD-10-CM | POA: Insufficient documentation

## 2018-06-11 DIAGNOSIS — O09293 Supervision of pregnancy with other poor reproductive or obstetric history, third trimester: Secondary | ICD-10-CM | POA: Diagnosis not present

## 2018-06-11 DIAGNOSIS — Z79899 Other long term (current) drug therapy: Secondary | ICD-10-CM | POA: Diagnosis not present

## 2018-06-11 NOTE — Discharge Instructions (Signed)
The office will call you to set up an appointment at the cardiologist as your heart rate has been higher at times than we expected today.  This will be a routine visit and hopefully you will have a chance to be seen in the next 2 weeks.

## 2018-06-11 NOTE — MAU Provider Note (Signed)
History     CSN: [id number]  Arrival date and time: 06/11/18 1343   First Provider Initiated Contact with Patient 06/11/18 1406      Chief Complaint  Patient presents with  . Vaginal Bleeding  . Decreased Fetal Movement   HPI Valerie Taylor 33 y.o. [redacted]w[redacted]d  Comes to MAU today as she had vaginal bleeding and cramping after intercourse this morning around 9 am.  The bleeding was heavy at first and now has lessened a lot.  Baby is moving now in MAU.  She was having cramping at first but that is much better now too.  OB History    Gravida  3   Para      Term      Preterm      AB  2   Living  0     SAB  1   TAB      Ectopic  1   Multiple      Live Births  0        Obstetric Comments  2004 per pt         Past Medical History:  Diagnosis Date  . Headache   . Infection    UTI  . Kidney stones     Past Surgical History:  Procedure Laterality Date  . NO PAST SURGERIES      Family History  Problem Relation Age of Onset  . Healthy Mother   . Healthy Father   . Asthma Sister   . Cancer Maternal Aunt        breast    Social History   Tobacco Use  . Smoking status: Never Smoker  . Smokeless tobacco: Never Used  Substance Use Topics  . Alcohol use: Never    Frequency: Never  . Drug use: Never    Allergies: No Known Allergies  Medications Prior to Admission  Medication Sig Dispense Refill Last Dose  . acetaminophen (TYLENOL) 500 MG tablet Take 500 mg by mouth every 6 (six) hours as needed for headache.   Past Month at Unknown time  . Prenatal Vit-Fe Fumarate-FA (PRENATAL MULTIVITAMIN) TABS tablet Take 1 tablet by mouth daily at 12 noon.   06/11/2018 at Unknown time  . ferrous sulfate 324 (65 Fe) MG TBEC Take 1 tablet (325 mg total) by mouth daily. 30 tablet 3 Unknown at Unknown time  . omeprazole (PRILOSEC) 20 MG capsule Take 1 capsule (20 mg total) by mouth daily. 14 capsule 1   . terconazole (TERAZOL 3) 0.8 % vaginal cream Place 1 applicator  vaginally at bedtime. 20 g 0   . triamcinolone cream (KENALOG) 0.1 % Apply 1 application topically 2 (two) times daily. (Patient not taking: Reported on 05/17/2018) 30 g 0 Not Taking    Review of Systems  Constitutional: Negative for fatigue and fever.  Gastrointestinal: Positive for abdominal pain. Negative for diarrhea, nausea and vomiting.  Genitourinary: Positive for vaginal bleeding. Negative for dysuria.  Musculoskeletal: Positive for back pain.   Physical Exam   Blood pressure 114/67, pulse 100, temperature 98.3 F (36.8 C), resp. rate 16, height 5\' 4"  (1.626 m), weight 89.3 kg, last menstrual period 09/17/2017, SpO2 100 %.  Physical Exam  Nursing note and vitals reviewed. Constitutional: She is oriented to person, place, and time. She appears well-developed and well-nourished.  HENT:  Head: Normocephalic.  Eyes: EOM are normal.  Neck: Neck supple.  Cardiovascular: Normal heart sounds.  Periodic asymptomatic tachycardia of 140-150 bpm seen on fetal tracing.  GI: Soft. There is no abdominal tenderness. There is no rebound and no guarding.  FHT baseline is 135 with moderate variability with 15x15 accels noted.  Occasional contraction at 6-9 minutes apart - irregular.  No decelerations.  Reactive NST.  Genitourinary:    Genitourinary Comments: Speculum exam: Vagina - Small amount of blood noted, no odor Cervix - small amount of streaking bloody mucus coming from the cervix Bimanual exam: Cervix internal os closed, thick, vertex, -2 but ballotable Chaperone present for exam.    Musculoskeletal: Normal range of motion.  Neurological: She is alert and oriented to person, place, and time.  Skin: Skin is warm and dry.  Psychiatric: She has a normal mood and affect.    MAU Course  Procedures No results found for this or any previous visit (from the past 24 hour(s)).  MDM Vaginal bleeding began today at 9 am after intercourse.  Initiailly felt decreased fetal movement but now  is feeling the baby move.  NST is reactive. Preliminary ultrasound reviewed.  Consult with Dr. Jolayne Panther.  Will refer for outpatient cardiology appointment as her heart rate at rest would go to 140-150 and client has no symptoms.  Assessment and Plan  Vaginal bleeding in 3rd trimester - currently resolved and ultrasound did not find any abruption Maternal tachycardia Baby moving well and NST reactive.  Plan Ambulatory referral ordered and message sent to office staff to schedule. Advised pelvic rest and return if she has additional vaginal bleeding. Reviewed contractions and advised to return with labor contractions. Advised to return if there is any problem with the baby not moving well. Keep your appointment in the office as scheduled on Friday.  Christian Treadway L Everlina Gotts 06/11/2018, 2:13 PM

## 2018-06-11 NOTE — MAU Note (Signed)
Pt reports heavy bleeding after intercourse today. Unsure if she is having contractions but has pain that comes and goes in her lower abd.

## 2018-06-11 NOTE — Telephone Encounter (Signed)
Pt called in reporting that she had been bleeding "a lot" earlier today and had felt the baby move right after, pt states she laid down after and now a couple hours later she is still having blood but just "spots" when she wipes and states she hasn't felt baby move since getting up. I [facility], pt verbalized understanding and says she will go.

## 2018-06-14 ENCOUNTER — Ambulatory Visit: Payer: BC Managed Care – PPO | Admitting: Cardiology

## 2018-06-14 ENCOUNTER — Other Ambulatory Visit: Payer: Self-pay

## 2018-06-14 ENCOUNTER — Encounter: Payer: Self-pay | Admitting: Medical

## 2018-06-14 ENCOUNTER — Ambulatory Visit (INDEPENDENT_AMBULATORY_CARE_PROVIDER_SITE_OTHER): Payer: BC Managed Care – PPO | Admitting: Medical

## 2018-06-14 VITALS — BP 117/75 | HR 105 | Wt 196.7 lb

## 2018-06-14 DIAGNOSIS — Z3483 Encounter for supervision of other normal pregnancy, third trimester: Secondary | ICD-10-CM

## 2018-06-14 DIAGNOSIS — Z3A38 38 weeks gestation of pregnancy: Secondary | ICD-10-CM

## 2018-06-14 DIAGNOSIS — O4440 Low lying placenta NOS or without hemorrhage, unspecified trimester: Secondary | ICD-10-CM

## 2018-06-14 DIAGNOSIS — O4443 Low lying placenta NOS or without hemorrhage, third trimester: Secondary | ICD-10-CM

## 2018-06-14 NOTE — Progress Notes (Signed)
   PRENATAL VISIT NOTE  Subjective:  Valerie Taylor is a 33 y.o. G3P0020 at [redacted]w[redacted]d being seen today for ongoing prenatal care.  She is currently monitored for the following issues for this low-risk pregnancy and has Supervision of normal pregnancy; History of ectopic pregnancy; History of pregnancy loss in prior pregnancy, currently pregnant; and Low-lying placenta on their problem list.  Patient reports no complaints.  Contractions: Irritability. Vag. Bleeding: None.  Movement: Present. Denies leaking of fluid.   The following portions of the patient's history were reviewed and updated as appropriate: allergies, current medications, past family history, past medical history, past social history, past surgical history and problem list.   Objective:   Vitals:   06/14/18 1031  BP: 117/75  Pulse: (!) 105  Weight: 196 lb 11.2 oz (89.2 kg)    Fetal Status: Fetal Heart Rate (bpm): 138 Fundal Height: 38 cm Movement: Present     General:  Alert, oriented and cooperative. Patient is in no acute distress.  Skin: Skin is warm and dry. No rash noted.   Cardiovascular: Normal heart rate noted  Respiratory: Normal respiratory effort, no problems with respiration noted  Abdomen: Soft, gravid, appropriate for gestational age.  Pain/Pressure: Present     Pelvic: Cervical exam deferred        Extremities: Normal range of motion.  Edema: None  Mental Status: Normal mood and affect. Normal behavior. Normal judgment and thought content.   Assessment and Plan:  Pregnancy: G3P0020 at [redacted]w[redacted]d 1. Encounter for supervision of other normal pregnancy in third trimester - Sees cardiology for tachycardia on Monday  - Discussed scheduling IOL at next visit if not delivered - Patient works far away and has decided to use vacation time to start leave early due to concerns with being able to get to the hospital on time  2. Low-lying placenta - resolved at last [place]  Term labor symptoms and general obstetric  precautions including but not limited to vaginal bleeding, contractions, leaking of fluid and fetal movement were reviewed in detail with the patient. Please refer to After Visit Summary for other counseling recommendations.   Return in about 1 week (around 06/21/2018) for LOB.  Future Appointments  Date Time Provider Department Center  06/17/2018  8:20 AM Jodelle Red, MD [facility]    Vonzella Nipple, PA-C

## 2018-06-14 NOTE — Patient Instructions (Signed)
Fetal Movement Counts Patient Name: ________________________________________________ Patient Due Date: ____________________ What is a fetal movement count?  A fetal movement count is the number of times that you feel your baby move during a certain amount of time. This may also be called a fetal kick count. A fetal movement count is recommended for every pregnant woman. You may be asked to start counting fetal movements as early as week 28 of your pregnancy. Pay attention to when your baby is most active. You may notice your baby's sleep and wake cycles. You may also notice things that make your baby move more. You should do a fetal movement count:  When your baby is normally most active.  At the same time each day. A good time to count movements is while you are resting, after having something to eat and drink. How do I count fetal movements? 1. Find a quiet, comfortable area. Sit, or lie down on your side. 2. Write down the date, the start time and stop time, and the number of movements that you felt between those two times. Take this information with you to your health care visits. 3. For 2 hours, count kicks, flutters, swishes, rolls, and jabs. You should feel at least 10 movements during 2 hours. 4. You may stop counting after you have felt 10 movements. 5. If you do not feel 10 movements in 2 hours, have something to eat and drink. Then, keep resting and counting for 1 hour. If you feel at least 4 movements during that hour, you may stop counting. Contact a health care provider if:  You feel fewer than 4 movements in 2 hours.  Your baby is not moving like he or she usually does. Date: ____________ Start time: ____________ Stop time: ____________ Movements: ____________ Date: ____________ Start time: ____________ Stop time: ____________ Movements: ____________ Date: ____________ Start time: ____________ Stop time: ____________ Movements: ____________ Date: ____________ Start time:  ____________ Stop time: ____________ Movements: ____________ Date: ____________ Start time: ____________ Stop time: ____________ Movements: ____________ Date: ____________ Start time: ____________ Stop time: ____________ Movements: ____________ Date: ____________ Start time: ____________ Stop time: ____________ Movements: ____________ Date: ____________ Start time: ____________ Stop time: ____________ Movements: ____________ Date: ____________ Start time: ____________ Stop time: ____________ Movements: ____________ This information is not intended to replace advice given to you by your health care provider. Make sure you discuss any questions you have with your health care provider. Document Released: 04/19/2006 Document Revised: 11/17/2015 Document Reviewed: 04/29/2015 Elsevier Interactive Patient Education  2019 [employer] Braxton Hicks Contractions Contractions of the uterus can occur throughout pregnancy, but they are not always a sign that you are in labor. You may have practice contractions called Braxton Hicks contractions. These false labor contractions are sometimes confused with true labor. What are Braxton Hicks contractions? Braxton Hicks contractions are tightening movements that occur in the muscles of the uterus before labor. Unlike true labor contractions, these contractions do not result in opening (dilation) and thinning of the cervix. Toward the end of pregnancy (32-34 weeks), Braxton Hicks contractions can happen more often and may become stronger. These contractions are sometimes difficult to tell apart from true labor because they can be very uncomfortable. You should not feel embarrassed if you go to the hospital with false labor. Sometimes, the only way to tell if you are in true labor is for your health care provider to look for changes in the cervix. The health care provider will do a physical exam and may monitor your contractions. If   you are not in true labor, the exam  should show that your cervix is not dilating and your water has not broken. If there are no other health problems associated with your pregnancy, it is completely safe for you to be sent home with false labor. You may continue to have Braxton Hicks contractions until you go into true labor. How to tell the difference between true labor and false labor True labor  Contractions last 30-70 seconds.  Contractions become very regular.  Discomfort is usually felt in the top of the uterus, and it spreads to the lower abdomen and low back.  Contractions do not go away with walking.  Contractions usually become more intense and increase in frequency.  The cervix dilates and gets thinner. False labor  Contractions are usually shorter and not as strong as true labor contractions.  Contractions are usually irregular.  Contractions are often felt in the front of the lower abdomen and in the groin.  Contractions may go away when you walk around or change positions while lying down.  Contractions get weaker and are shorter-lasting as time goes on.  The cervix usually does not dilate or become thin. Follow these instructions at home:   Take over-the-counter and prescription medicines only as told by your health care provider.  Keep up with your usual exercises and follow other instructions from your health care provider.  Eat and drink lightly if you think you are going into labor.  If Braxton Hicks contractions are making you uncomfortable: ? Change your position from lying down or resting to walking, or change from walking to resting. ? Sit and rest in a tub of warm water. ? Drink enough fluid to keep your urine pale yellow. Dehydration may cause these contractions. ? Do slow and deep breathing several times an hour.  Keep all follow-up prenatal visits as told by your health care provider. This is important. Contact a health care provider if:  You have a fever.  You have continuous  pain in your abdomen. Get help right away if:  Your contractions become stronger, more regular, and closer together.  You have fluid leaking or gushing from your vagina.  You pass blood-tinged mucus (bloody show).  You have bleeding from your vagina.  You have low back pain that you never had before.  You feel your baby's head pushing down and causing pelvic pressure.  Your baby is not moving inside you as much as it used to. Summary  Contractions that occur before labor are called Braxton Hicks contractions, false labor, or practice contractions.  Braxton Hicks contractions are usually shorter, weaker, farther apart, and less regular than true labor contractions. True labor contractions usually become progressively stronger and regular, and they become more frequent.  Manage discomfort from Braxton Hicks contractions by changing position, resting in a warm bath, drinking plenty of water, or practicing deep breathing. This information is not intended to replace advice given to you by your health care provider. Make sure you discuss any questions you have with your health care provider. Document Released: 08/03/2016 Document Revised: 01/02/2017 Document Reviewed: 08/03/2016 Elsevier Interactive Patient Education  2019 [employer] Inc[employer]  

## 2018-06-16 ENCOUNTER — Inpatient Hospital Stay (HOSPITAL_COMMUNITY)
Admission: AD | Admit: 2018-06-16 | Discharge: 2018-06-16 | Disposition: A | Discharge status: Home / Self Care | Payer: BC Managed Care – PPO | Attending: Obstetrics & Gynecology | Admitting: Obstetrics & Gynecology

## 2018-06-16 ENCOUNTER — Encounter (HOSPITAL_COMMUNITY): Payer: Self-pay | Admitting: *Deleted

## 2018-06-16 ENCOUNTER — Other Ambulatory Visit: Payer: Self-pay

## 2018-06-16 DIAGNOSIS — N898 Other specified noninflammatory disorders of vagina: Secondary | ICD-10-CM

## 2018-06-16 DIAGNOSIS — Z3A38 38 weeks gestation of pregnancy: Secondary | ICD-10-CM | POA: Diagnosis not present

## 2018-06-16 DIAGNOSIS — O4443 Low lying placenta NOS or without hemorrhage, third trimester: Secondary | ICD-10-CM | POA: Insufficient documentation

## 2018-06-16 DIAGNOSIS — O4440 Low lying placenta NOS or without hemorrhage, unspecified trimester: Secondary | ICD-10-CM

## 2018-06-16 LAB — WET PREP, GENITAL
Clue Cells Wet Prep HPF POC: NONE SEEN
Sperm: NONE SEEN
Trich, Wet Prep: NONE SEEN
Yeast Wet Prep HPF POC: NONE SEEN

## 2018-06-16 NOTE — Discharge Instructions (Signed)
Braxton Hicks Contractions °Contractions of the uterus can occur throughout pregnancy, but they are not always a sign that you are in labor. You may have practice contractions called Braxton Hicks contractions. These false labor contractions are sometimes confused with true labor. °What are Braxton Hicks contractions? °Braxton Hicks contractions are tightening movements that occur in the muscles of the uterus before labor. Unlike true labor contractions, these contractions do not result in opening (dilation) and thinning of the cervix. Toward the end of pregnancy (32-34 weeks), Braxton Hicks contractions can happen more often and may become stronger. These contractions are sometimes difficult to tell apart from true labor because they can be very uncomfortable. You should not feel embarrassed if you go to the hospital with false labor. °Sometimes, the only way to tell if you are in true labor is for your health care provider to look for changes in the cervix. The health care provider will do a physical exam and may monitor your contractions. If you are not in true labor, the exam should show that your cervix is not dilating and your water has not broken. °If there are no other health problems associated with your pregnancy, it is completely safe for you to be sent home with false labor. You may continue to have Braxton Hicks contractions until you go into true labor. °How to tell the difference between true labor and false labor °True labor °· Contractions last 30-70 seconds. °· Contractions become very regular. °· Discomfort is usually felt in the top of the uterus, and it spreads to the lower abdomen and low back. °· Contractions do not go away with walking. °· Contractions usually become more intense and increase in frequency. °· The cervix dilates and gets thinner. °False labor °· Contractions are usually shorter and not as strong as true labor contractions. °· Contractions are usually irregular. °· Contractions  are often felt in the front of the lower abdomen and in the groin. °· Contractions may go away when you walk around or change positions while lying down. °· Contractions get weaker and are shorter-lasting as time goes on. °· The cervix usually does not dilate or become thin. °Follow these instructions at home: ° °· Take over-the-counter and prescription medicines only as told by your health care provider. °· Keep up with your usual exercises and follow other instructions from your health care provider. °· Eat and drink lightly if you think you are going into labor. °· If Braxton Hicks contractions are making you uncomfortable: °? Change your position from lying down or resting to walking, or change from walking to resting. °? Sit and rest in a tub of warm water. °? Drink enough fluid to keep your urine pale yellow. Dehydration may cause these contractions. °? Do slow and deep breathing several times an hour. °· Keep all follow-up prenatal visits as told by your health care provider. This is important. °Contact a health care provider if: °· You have a fever. °· You have continuous pain in your abdomen. °Get help right away if: °· Your contractions become stronger, more regular, and closer together. °· You have fluid leaking or gushing from your vagina. °· You pass blood-tinged mucus (bloody show). °· You have bleeding from your vagina. °· You have low back pain that you never had before. °· You feel your baby’s head pushing down and causing pelvic pressure. °· Your baby is not moving inside you as much as it used to. °Summary °· Contractions that occur before labor are   called Braxton Hicks contractions, false labor, or practice contractions.  Braxton Hicks contractions are usually shorter, weaker, farther apart, and less regular than true labor contractions. True labor contractions usually become progressively stronger and regular, and they become more frequent.  Manage discomfort from [facility] contractions  by changing position, resting in a warm bath, drinking plenty of water, or practicing deep breathing. This information is not intended to replace advice given to you by your health care provider. Make sure you discuss any questions you have with your health care provider. Document Released: 08/03/2016 Document Revised: 01/02/2017 Document Reviewed: 08/03/2016 Elsevier Interactive Patient Education  2019 [employer]   Fetal Movement Counts Patient Name: ________________________________________________ Patient Due Date: ____________________ What is a fetal movement count?  A fetal movement count is the number of times that you feel your baby move during a certain amount of time. This may also be called a fetal kick count. A fetal movement count is recommended for every pregnant woman. You may be asked to start counting fetal movements as early as week 28 of your pregnancy. Pay attention to when your baby is most active. You may notice your baby's sleep and wake cycles. You may also notice things that make your baby move more. You should do a fetal movement count: When your baby is normally most active. At the same time each day. A good time to count movements is while you are resting, after having something to eat and drink. How do I count fetal movements? Find a quiet, comfortable area. Sit, or lie down on your side. Write down the date, the start time and stop time, and the number of movements that you felt between those two times. Take this information with you to your health care visits. For 2 hours, count kicks, flutters, swishes, rolls, and jabs. You should feel at least 10 movements during 2 hours. You may stop counting after you have felt 10 movements. If you do not feel 10 movements in 2 hours, have something to eat and drink. Then, keep resting and counting for 1 hour. If you feel at least 4 movements during that hour, you may stop counting. Contact a health care provider if: You  feel fewer than 4 movements in 2 hours. Your baby is not moving like he or she usually does. Date: ____________ Start time: ____________ Stop time: ____________ Movements: ____________ Date: ____________ Start time: ____________ Stop time: ____________ Movements: ____________ Date: ____________ Start time: ____________ Stop time: ____________ Movements: ____________ Date: ____________ Start time: ____________ Stop time: ____________ Movements: ____________ Date: ____________ Start time: ____________ Stop time: ____________ Movements: ____________ Date: ____________ Start time: ____________ Stop time: ____________ Movements: ____________ Date: ____________ Start time: ____________ Stop time: ____________ Movements: ____________ Date: ____________ Start time: ____________ Stop time: ____________ Movements: ____________ Date: ____________ Start time: ____________ Stop time: ____________ Movements: ____________ This information is not intended to replace advice given to you by your health care provider. Make sure you discuss any questions you have with your health care provider. Document Released: 04/19/2006 Document Revised: 11/17/2015 Document Reviewed: 04/29/2015 Elsevier Interactive Patient Education  2019 [employer].

## 2018-06-16 NOTE — MAU Note (Signed)
Thinks she has a yeast inf.  Is swollen.  Doesn't usually get the d/c, just swells.

## 2018-06-16 NOTE — MAU Provider Note (Signed)
History   [id number]   Chief Complaint  Patient presents with  . vag discomfort    HPI Valerie Taylor is a 33 y.o. female  G3P0020 @38 .6 wks here for possible yeast infection. Feeling itchy and irritated at the vaginal opening. Doesn't know if she has discharge. Had IC this am. No new partner. No new soaps or products. Pt reports no contractions. She reports good fetal movement. All other systems negative.    Patient's last menstrual period was 09/17/2017.  OB History  Gravida Para Term Preterm AB Living  3       2 0  SAB TAB Ectopic Multiple Live Births  1   1   0    # Outcome Date GA Lbr Len/2nd Weight Sex Delivery Anes PTL Lv  3 Current           2 Ectopic           1 SAB  [redacted]w[redacted]d           Obstetric Comments  2004 per pt     Past Medical History:  Diagnosis Date  . Headache   . Infection    UTI  . Kidney stones     Family History  Problem Relation Age of Onset  . Healthy Mother   . Healthy Father   . Asthma Sister   . Cancer Maternal Aunt        breast    Social History   Socioeconomic History  . Marital status: Single    Spouse name: Not on file  . Number of children: Not on file  . Years of education: Not on file  . Highest education level: Not on file  Occupational History  . Not on file  Social Needs  . Financial resource strain: Not on file  . Food insecurity:    Worry: Not on file    Inability: Not on file  . Transportation needs:    Medical: Not on file    Non-medical: Not on file  Tobacco Use  . Smoking status: Never Smoker  . Smokeless tobacco: Never Used  Substance and Sexual Activity  . Alcohol use: Never    Frequency: Never  . Drug use: Never  . Sexual activity: Yes    Partners: Male    Comment: Pregnant  Lifestyle  . Physical activity:    Days per week: Not on file    Minutes per session: Not on file  . Stress: Not on file  Relationships  . Social connections:    Talks on phone: Not on file    Gets together: Not on  file    Attends religious service: Not on file    Active member of club or organization: Not on file    Attends meetings of clubs or organizations: Not on file    Relationship status: Not on file  Other Topics Concern  . Not on file  Social History Narrative  . Not on file    No Known Allergies  No current facility-administered medications on file prior to encounter.    Current Outpatient Medications on File Prior to Encounter  Medication Sig Dispense Refill  . acetaminophen (TYLENOL) 500 MG tablet Take 500 mg by mouth every 6 (six) hours as needed for headache.    . ferrous sulfate 324 (65 Fe) MG TBEC Take 1 tablet (325 mg total) by mouth daily. 30 tablet 3  . Prenatal Vit-Fe Fumarate-FA (PRENATAL MULTIVITAMIN) TABS tablet Take 1 tablet by mouth daily at  12 noon.       Review of Systems  Gastrointestinal: Negative for abdominal pain.  Genitourinary: Positive for vaginal pain. Negative for vaginal bleeding and vaginal discharge.   Physical Exam   Vitals:   06/16/18 0857  BP: 117/70  Pulse: (!) 111  Resp: 17  Temp: 98.4 F (36.9 C)  TempSrc: Oral  SpO2: 100%  Weight: 89 kg    Physical Exam  Constitutional: She is oriented to person, place, and time. She appears well-developed and well-nourished. No distress.  HENT:  Head: Normocephalic and atraumatic.  Neck: Normal range of motion.  Cardiovascular: Normal rate.  Respiratory: Effort normal. No respiratory distress.  Genitourinary: There is no rash, tenderness, lesion or injury on the right labia. There is no rash, tenderness, lesion or injury on the left labia.    Genitourinary Comments: No erythema or discharge at introitus   Musculoskeletal: Normal range of motion.  Neurological: She is alert and oriented to person, place, and time.  Skin: Skin is warm and dry.  Psychiatric: She has a normal mood and affect.    FHT 140  Results for orders placed or performed during the hospital encounter of 06/16/18 (from the  past 24 hour(s))  Wet prep, genital     Status: Abnormal   Collection Time: 06/16/18  9:23 AM  Result Value Ref Range   Yeast Wet Prep HPF POC NONE SEEN NONE SEEN   Trich, Wet Prep NONE SEEN NONE SEEN   Clue Cells Wet Prep HPF POC NONE SEEN NONE SEEN   WBC, Wet Prep HPF POC FEW (A) NONE SEEN   Sperm NONE SEEN     MAU Course  Procedures Orders Placed This Encounter  Procedures  . Wet prep, genital    Standing Status:   Standing    Number of Occurrences:   1    Order Specific Question:   Patient immune status    Answer:   Normal  . Discharge patient    Order Specific Question:   Discharge disposition    Answer:   01-Home or Self Care [1]    Order Specific Question:   Discharge patient date    Answer:   06/16/2018   MDM Labs ordered and reviewed. No evidence of infection. Recent GC negative, not repeated. Stable for discharge home.   Assessment and Plan   1. [redacted] weeks gestation of pregnancy   2. Low-lying placenta   3. Vaginal irritation    Discharge home Follow up at [facility] as scheduled Labor precautions FMCs Tubs soaks with baking soda Pelvic rest  Allergies as of 06/16/2018   No Known Allergies     Medication List    TAKE these medications   acetaminophen 500 MG tablet Commonly known as:  TYLENOL Take 500 mg by mouth every 6 (six) hours as needed for headache.   ferrous sulfate 324 (65 Fe) MG Tbec Take 1 tablet (325 mg total) by mouth daily.   prenatal multivitamin Tabs tablet Take 1 tablet by mouth daily at 12 noon.      Donette Larry, CNM 06/16/2018 9:51 AM

## 2018-06-17 ENCOUNTER — Ambulatory Visit (INDEPENDENT_AMBULATORY_CARE_PROVIDER_SITE_OTHER): Payer: BC Managed Care – PPO | Admitting: Cardiology

## 2018-06-17 ENCOUNTER — Encounter: Payer: Self-pay | Admitting: Cardiology

## 2018-06-17 ENCOUNTER — Telehealth: Payer: Self-pay | Admitting: *Deleted

## 2018-06-17 VITALS — BP 118/70 | HR 90 | Ht 64.0 in | Wt 195.2 lb

## 2018-06-17 DIAGNOSIS — Z7189 Other specified counseling: Secondary | ICD-10-CM | POA: Diagnosis not present

## 2018-06-17 DIAGNOSIS — I479 Paroxysmal tachycardia, unspecified: Secondary | ICD-10-CM

## 2018-06-17 NOTE — Patient Instructions (Signed)
Medication Instructions:  Your physician recommends that you continue on your current medications as directed. Please refer to the Current Medication list given to you today.  If you need a refill on your cardiac medications before your next appointment, please call your pharmacy.   Lab work: NONE If you have labs (blood work) drawn today and your tests are completely normal, you will receive your results only by: Marland Kitchen MyChart Message (if you have MyChart) OR . A paper copy in the mail If you have any lab test that is abnormal or we need to change your treatment, we will call you to review the results.  Testing/Procedures: NONE  Follow-Up: At [facility], you and your health needs are our priority.  As part of our continuing mission to provide you with exceptional heart care, we have created designated Provider Care Teams.  These Care Teams include your primary Cardiologist (physician) and Advanced Practice Providers (APPs -  Physician Assistants and Nurse Practitioners) who all work together to provide you with the care you need, when you need it. . You may schedule a follow up appointment AS NEEDED. You may see Dr. Cristal Deer or one of the following Advanced Practice Providers on your designated Care Team:   . Corine Shelter, [place] . Azalee Course, PA-C . Micah Flesher, PA-C . Joni Reining, DNP . Theodore Demark, PA-C . Judy Pimple, PA-C . Marjie Skiff, PA-C

## 2018-06-17 NOTE — Progress Notes (Signed)
Cardiology Office Note:    Date:  06/17/2018   ID:  Valerie Taylor, DOB 1986-03-29, MRN [id number]  PCP:  Sobieski BingPickens, Charlie, MD  Cardiologist:  Jodelle RedBridgette Canyon Lohr, MD PhD  Referring MD: Currie ParisBurleson, Terri L, NP   Cc: referral for tachycardia  History of Present Illness:    Valerie Taylor is a 33 y.o. female who is nearly 7639 weeks pregnant with her first pregnancy who is seen as a new consult at the request of Burleson, Terri L, NP for the evaluation and management of tachycardia.  Reports that at her last OB visit, she was nervous and her heart rate was recorded at 105 bpm. She was bleeding at 38 weeks and was nervous about what they were going to say. Did not have an ECG done at that time. No chest pain or other symptoms at the time. Has felt fine otherwise. Awaiting word on whether she will await natural labor or be induced.  No history of personal heart issues. None that she knows of in her family. Denies history of diabetes. No issues with blood pressure.   Denies chest pain, shortness of breath at rest or with normal exertion. No PND, orthopnea, LE edema or unexpected weight gain. No syncope or palpitations.  Past Medical History:  Diagnosis Date  . Headache   . Infection    UTI  . Kidney stones     Past Surgical History:  Procedure Laterality Date  . NO PAST SURGERIES      Current Medications: Current Outpatient Medications on File Prior to Visit  Medication Sig  . acetaminophen (TYLENOL) 500 MG tablet Take 500 mg by mouth every 6 (six) hours as needed for headache.  . ferrous sulfate 324 (65 Fe) MG TBEC Take 1 tablet (325 mg total) by mouth daily.  . Prenatal Vit-Fe Fumarate-FA (PRENATAL MULTIVITAMIN) TABS tablet Take 1 tablet by mouth daily at 12 noon.   No current facility-administered medications on file prior to visit.      Allergies:   Patient has no known allergies.   Social History   Socioeconomic History  . Marital status: Single    Spouse name:  Not on file  . Number of children: Not on file  . Years of education: Not on file  . Highest education level: Not on file  Occupational History  . Not on file  Social Needs  . Financial resource strain: Not on file  . Food insecurity:    Worry: Not on file    Inability: Not on file  . Transportation needs:    Medical: Not on file    Non-medical: Not on file  Tobacco Use  . Smoking status: Never Smoker  . Smokeless tobacco: Never Used  Substance and Sexual Activity  . Alcohol use: Never    Frequency: Never  . Drug use: Never  . Sexual activity: Yes    Partners: Male    Comment: Pregnant  Lifestyle  . Physical activity:    Days per week: Not on file    Minutes per session: Not on file  . Stress: Not on file  Relationships  . Social connections:    Talks on phone: Not on file    Gets together: Not on file    Attends religious service: Not on file    Active member of club or organization: Not on file    Attends meetings of clubs or organizations: Not on file    Relationship status: Not on file  Other Topics Concern  .  Not on file  Social History Narrative  . Not on file     Family History: The patient's family history includes Asthma in her sister; Cancer in her maternal aunt; Healthy in her father and mother.  ROS:   Please see the history of present illness.  Additional pertinent ROS:  Constitutional: Negative for chills, fever, night sweats, unintentional weight loss  HENT: Negative for ear pain and hearing loss.   Eyes: Negative for loss of vision and eye pain.  Respiratory: Negative for cough, sputum, shortness of breath, wheezing.   Cardiovascular: See HPI. Gastrointestinal: Negative for abdominal pain, melena, and hematochezia.  Genitourinary: Negative for dysuria and hematuria.  Musculoskeletal: Negative for falls and myalgias.  Skin: Negative for itching and rash.  Neurological: Negative for focal weakness, focal sensory changes and loss of  consciousness.  Endo/Heme/Allergies: Does not bruise/bleed easily.    EKGs/Labs/Other Studies Reviewed:    The following studies were reviewed today: Notes reviewed  EKG:  EKG is personally reviewed.  The ekg ordered today demonstrates normal sinus rhythm with sinus arrhythmia.   Recent Labs: 09/20/2017: ALT 17; BUN 8; Creatinine, Ser 0.74; Potassium 4.0; Sodium 137 05/17/2018: Hemoglobin 10.4; Platelets 329  Recent Lipid Panel No results found for: CHOL, TRIG, HDL, CHOLHDL, VLDL, LDLCALC, LDLDIRECT  Physical Exam:    VS:  BP 118/70   Pulse 90   Ht 5\' 4"  (1.626 m)   Wt 195 lb 3.2 oz (88.5 kg)   LMP 09/17/2017   BMI 33.51 kg/m     Wt Readings from Last 3 Encounters:  06/17/18 195 lb 3.2 oz (88.5 kg)  06/16/18 196 lb 1.6 oz (89 kg)  06/14/18 196 lb 11.2 oz (89.2 kg)     GEN: Well nourished, well developed in no acute distress HEENT: Normal NECK: No JVD; No carotid bruits LYMPHATICS: No lymphadenopathy CARDIAC: regular rhythm, normal S1 and S2, no rubs. Expected flow murmur and S3 of pregnancy. Radial and DP pulses 2+ bilaterally. RESPIRATORY:  Clear to auscultation without rales, wheezing or rhonchi  ABDOMEN: Gravid abdomen, nontender MUSCULOSKELETAL:  No edema; No deformity  SKIN: Warm and dry NEUROLOGIC:  Alert and oriented x 3 PSYCHIATRIC:  Normal affect   ASSESSMENT:    1. Tachycardia, paroxysmal (HCC)   2. Cardiac risk counseling    PLAN:    Tachycardia: she is normal heart rate and sinus rhythm today. She did not have an ECG at the time of her tachycardia, but she does endorse being anxious/stressed at the time. No palpitations or other symptoms to suggest arrhythmia. Given her ECG today, no further workup indicated -instructed that if she develops palpitations or racing beats, call the office and we will reassess -no limitations from a cardiac standpoint to normal labor and delivery procedures.   Cardiac risk counseling and prevention recommendations:  -recommend heart healthy/Mediterranean diet, with whole grains, fruits, vegetable, fish, lean meats, nuts, and olive oil. Limit salt. -recommend moderate walking, 3-5 times/week for 30-50 minutes each session. Aim for at least 150 minutes.week. Goal should be pace of 3 miles/hours, or walking 1.5 miles in 30 minutes -recommend avoidance of tobacco products. Avoid alcohol while pregnant and excess alcohol after delivery.  Plan for follow up: PRN  Medication Adjustments/Labs and Tests Ordered: Current medicines are reviewed at length with the patient today.  Concerns regarding medicines are outlined above.  Orders Placed This Encounter  Procedures  . EKG 12-Lead   No orders of the defined types were placed in this encounter.  Patient Instructions  Medication Instructions:  Your physician recommends that you continue on your current medications as directed. Please refer to the Current Medication list given to you today.  If you need a refill on your cardiac medications before your next appointment, please call your pharmacy.   Lab work: NONE If you have labs (blood work) drawn today and your tests are completely normal, you will receive your results only by: Marland Kitchen MyChart Message (if you have MyChart) OR . A paper copy in the mail If you have any lab test that is abnormal or we need to change your treatment, we will call you to review the results.  Testing/Procedures: NONE  Follow-Up: At [facility], you and your health needs are our priority.  As part of our continuing mission to provide you with exceptional heart care, we have created designated Provider Care Teams.  These Care Teams include your primary Cardiologist (physician) and Advanced Practice Providers (APPs -  Physician Assistants and Nurse Practitioners) who all work together to provide you with the care you need, when you need it. . You may schedule a follow up appointment AS NEEDED. You may see Dr. Cristal Deer or one of  the following Advanced Practice Providers on your designated Care Team:   . Corine Shelter, [place] . Azalee Course, PA-C . Micah Flesher, PA-C . Joni Reining, DNP . Theodore Demark, PA-C . Judy Pimple, PA-C . Marjie Skiff, PA-C        Signed, Jodelle Red, MD PhD 06/17/2018 9:57 AM    [facility]

## 2018-06-17 NOTE — Telephone Encounter (Signed)
Received a voice message from this am from a Leary Roca stating she is a Financial risk analyst with [employer] and she is calling to notify our office that she has enrolled this patient and wanted to let us know if there are any collaborative needs to call her . States do not need to respond to this call unless you have a need.    Per chart review is [facility] patient - will forward.

## 2018-06-18 ENCOUNTER — Other Ambulatory Visit: Payer: Self-pay

## 2018-06-18 ENCOUNTER — Encounter (HOSPITAL_COMMUNITY): Payer: Self-pay

## 2018-06-18 ENCOUNTER — Inpatient Hospital Stay (HOSPITAL_COMMUNITY): Payer: BC Managed Care – PPO | Admitting: Anesthesiology

## 2018-06-18 ENCOUNTER — Inpatient Hospital Stay (HOSPITAL_COMMUNITY)
Admission: AD | Admit: 2018-06-18 | Discharge: 2018-06-20 | DRG: 807 | Disposition: A | Discharge status: Home / Self Care | Payer: BC Managed Care – PPO | Source: Ambulatory Visit | Attending: Family Medicine | Admitting: Family Medicine

## 2018-06-18 DIAGNOSIS — D649 Anemia, unspecified: Secondary | ICD-10-CM | POA: Diagnosis present

## 2018-06-18 DIAGNOSIS — O9902 Anemia complicating childbirth: Secondary | ICD-10-CM | POA: Diagnosis present

## 2018-06-18 DIAGNOSIS — Z3A39 39 weeks gestation of pregnancy: Secondary | ICD-10-CM | POA: Diagnosis not present

## 2018-06-18 DIAGNOSIS — O09293 Supervision of pregnancy with other poor reproductive or obstetric history, third trimester: Secondary | ICD-10-CM

## 2018-06-18 DIAGNOSIS — O4440 Low lying placenta NOS or without hemorrhage, unspecified trimester: Secondary | ICD-10-CM

## 2018-06-18 DIAGNOSIS — O26893 Other specified pregnancy related conditions, third trimester: Secondary | ICD-10-CM | POA: Diagnosis present

## 2018-06-18 LAB — CBC
HCT: 31.6 % — ABNORMAL LOW (ref 36.0–46.0)
Hemoglobin: 9.6 g/dL — ABNORMAL LOW (ref 12.0–15.0)
MCH: 27.9 pg (ref 26.0–34.0)
MCHC: 30.4 g/dL (ref 30.0–36.0)
MCV: 91.9 fL (ref 80.0–100.0)
Platelets: 281 10*3/uL (ref 150–400)
RBC: 3.44 MIL/uL — ABNORMAL LOW (ref 3.87–5.11)
RDW: 13.3 % (ref 11.5–15.5)
WBC: 12.3 10*3/uL — ABNORMAL HIGH (ref 4.0–10.5)
nRBC: 0 % (ref 0.0–0.2)

## 2018-06-18 LAB — ABO/RH: ABO/RH(D): B POS

## 2018-06-18 LAB — RPR: RPR Ser Ql: NONREACTIVE

## 2018-06-18 LAB — TYPE AND SCREEN
ABO/RH(D): B POS
Antibody Screen: NEGATIVE

## 2018-06-18 MED ORDER — TERBUTALINE SULFATE 1 MG/ML IJ SOLN: 0.2500 mg | Freq: Once | INTRAMUSCULAR | Status: DC | PRN

## 2018-06-18 MED ORDER — OXYTOCIN BOLUS FROM INFUSION
500.0000 mL | Freq: Once | INTRAVENOUS | Status: AC
  Administered 2018-06-18: 500 mL via INTRAVENOUS

## 2018-06-18 MED ORDER — LIDOCAINE HCL (PF) 1 % IJ SOLN
INTRAMUSCULAR | Status: DC | PRN
  Administered 2018-06-18: 5 mL via EPIDURAL

## 2018-06-18 MED ORDER — DIBUCAINE 1 % RE OINT: 1.0000 "application " | TOPICAL_OINTMENT | RECTAL | Status: DC | PRN

## 2018-06-18 MED ORDER — OXYTOCIN 40 UNITS IN NORMAL SALINE INFUSION - SIMPLE MED
2.5000 [IU]/h | INTRAVENOUS | Status: DC
  Filled 2018-06-18: qty 1000

## 2018-06-18 MED ORDER — PHENYLEPHRINE 40 MCG/ML (10ML) SYRINGE FOR IV PUSH (FOR BLOOD PRESSURE SUPPORT)
80.0000 ug | PREFILLED_SYRINGE | INTRAVENOUS | Status: DC | PRN
  Administered 2018-06-18: 80 ug via INTRAVENOUS

## 2018-06-18 MED ORDER — SODIUM CHLORIDE (PF) 0.9 % IJ SOLN
INTRAMUSCULAR | Status: DC | PRN
  Administered 2018-06-18: 12 mL/h via EPIDURAL

## 2018-06-18 MED ORDER — SENNOSIDES-DOCUSATE SODIUM 8.6-50 MG PO TABS
2.0000 | ORAL_TABLET | ORAL | Status: DC
  Administered 2018-06-18 – 2018-06-19 (×2): 2 via ORAL
  Filled 2018-06-18 (×2): qty 2

## 2018-06-18 MED ORDER — ACETAMINOPHEN 325 MG PO TABS
650.0000 mg | ORAL_TABLET | ORAL | Status: DC | PRN
  Administered 2018-06-20: 650 mg via ORAL
  Filled 2018-06-18: qty 2

## 2018-06-18 MED ORDER — DIPHENHYDRAMINE HCL 25 MG PO CAPS: 25.0000 mg | ORAL_CAPSULE | Freq: Four times a day (QID) | ORAL | Status: DC | PRN

## 2018-06-18 MED ORDER — OXYTOCIN 40 UNITS IN NORMAL SALINE INFUSION - SIMPLE MED
1.0000 m[IU]/min | INTRAVENOUS | Status: DC
  Administered 2018-06-18: 2 m[IU]/min via INTRAVENOUS

## 2018-06-18 MED ORDER — EPHEDRINE 5 MG/ML INJ
10.0000 mg | INTRAVENOUS | Status: DC | PRN
  Filled 2018-06-18: qty 2

## 2018-06-18 MED ORDER — ONDANSETRON HCL 4 MG/2ML IJ SOLN: 4.0000 mg | INTRAMUSCULAR | Status: DC | PRN

## 2018-06-18 MED ORDER — DIPHENHYDRAMINE HCL 50 MG/ML IJ SOLN: 12.5000 mg | INTRAMUSCULAR | Status: DC | PRN

## 2018-06-18 MED ORDER — LACTATED RINGERS IV SOLN: 500.0000 mL | Freq: Once | INTRAVENOUS | Status: DC

## 2018-06-18 MED ORDER — SIMETHICONE 80 MG PO CHEW: 80.0000 mg | CHEWABLE_TABLET | ORAL | Status: DC | PRN

## 2018-06-18 MED ORDER — ONDANSETRON HCL 4 MG PO TABS: 4.0000 mg | ORAL_TABLET | ORAL | Status: DC | PRN

## 2018-06-18 MED ORDER — BENZOCAINE-MENTHOL 20-0.5 % EX AERO
1.0000 "application " | INHALATION_SPRAY | CUTANEOUS | Status: DC | PRN
  Administered 2018-06-19: 1 via TOPICAL
  Filled 2018-06-18: qty 56

## 2018-06-18 MED ORDER — WITCH HAZEL-GLYCERIN EX PADS: 1.0000 "application " | MEDICATED_PAD | CUTANEOUS | Status: DC | PRN

## 2018-06-18 MED ORDER — ACETAMINOPHEN 325 MG PO TABS
650.0000 mg | ORAL_TABLET | ORAL | Status: DC | PRN
  Administered 2018-06-18: 650 mg via ORAL
  Filled 2018-06-18: qty 2

## 2018-06-18 MED ORDER — FENTANYL-BUPIVACAINE-NACL 0.5-0.125-0.9 MG/250ML-% EP SOLN
12.0000 mL/h | EPIDURAL | Status: DC | PRN
  Filled 2018-06-18: qty 250

## 2018-06-18 MED ORDER — OXYCODONE-ACETAMINOPHEN 5-325 MG PO TABS: 1.0000 | ORAL_TABLET | ORAL | Status: DC | PRN

## 2018-06-18 MED ORDER — ONDANSETRON HCL 4 MG/2ML IJ SOLN
4.0000 mg | Freq: Four times a day (QID) | INTRAMUSCULAR | Status: DC | PRN
  Administered 2018-06-18: 4 mg via INTRAVENOUS
  Filled 2018-06-18: qty 2

## 2018-06-18 MED ORDER — FENTANYL CITRATE (PF) 100 MCG/2ML IJ SOLN
100.0000 ug | INTRAMUSCULAR | Status: DC | PRN
  Administered 2018-06-18: 100 ug via INTRAVENOUS

## 2018-06-18 MED ORDER — FLEET ENEMA 7-19 GM/118ML RE ENEM: 1.0000 | ENEMA | RECTAL | Status: DC | PRN

## 2018-06-18 MED ORDER — LACTATED RINGERS IV SOLN: 500.0000 mL | INTRAVENOUS | Status: DC | PRN

## 2018-06-18 MED ORDER — EPHEDRINE 5 MG/ML INJ: 10.0000 mg | INTRAVENOUS | Status: DC | PRN

## 2018-06-18 MED ORDER — OXYTOCIN 40 UNITS IN NORMAL SALINE INFUSION - SIMPLE MED: 1.0000 m[IU]/min | INTRAVENOUS | Status: DC

## 2018-06-18 MED ORDER — LIDOCAINE HCL (PF) 1 % IJ SOLN: 30.0000 mL | INTRAMUSCULAR | Status: DC | PRN

## 2018-06-18 MED ORDER — LACTATED RINGERS IV SOLN
INTRAVENOUS | Status: DC
  Administered 2018-06-18 (×2): via INTRAVENOUS

## 2018-06-18 MED ORDER — LACTATED RINGERS AMNIOINFUSION
INTRAVENOUS | Status: DC
  Administered 2018-06-18: 300 mL via INTRAUTERINE

## 2018-06-18 MED ORDER — SOD CITRATE-CITRIC ACID 500-334 MG/5ML PO SOLN: 30.0000 mL | ORAL | Status: DC | PRN

## 2018-06-18 MED ORDER — IBUPROFEN 600 MG PO TABS
600.0000 mg | ORAL_TABLET | Freq: Four times a day (QID) | ORAL | Status: DC
  Administered 2018-06-18 – 2018-06-20 (×7): 600 mg via ORAL
  Filled 2018-06-18 (×9): qty 1

## 2018-06-18 MED ORDER — OXYCODONE-ACETAMINOPHEN 5-325 MG PO TABS: 2.0000 | ORAL_TABLET | ORAL | Status: DC | PRN

## 2018-06-18 MED ORDER — PRENATAL MULTIVITAMIN CH
1.0000 | ORAL_TABLET | Freq: Every day | ORAL | Status: DC
  Administered 2018-06-19 – 2018-06-20 (×2): 1 via ORAL
  Filled 2018-06-18 (×3): qty 1

## 2018-06-18 MED ORDER — COCONUT OIL OIL: 1.0000 "application " | TOPICAL_OIL | Status: DC | PRN

## 2018-06-18 MED ORDER — PHENYLEPHRINE 40 MCG/ML (10ML) SYRINGE FOR IV PUSH (FOR BLOOD PRESSURE SUPPORT)
80.0000 ug | PREFILLED_SYRINGE | INTRAVENOUS | Status: DC | PRN
  Filled 2018-06-18 (×2): qty 10

## 2018-06-18 MED ORDER — FENTANYL CITRATE (PF) 100 MCG/2ML IJ SOLN
INTRAMUSCULAR | Status: AC
  Filled 2018-06-18: qty 2

## 2018-06-18 NOTE — H&P (Signed)
Valerie Taylor is a 33 y.o. female presents for contractions and cervical dilation. Patient is at [redacted]w[redacted]d and G3P0020 with history of low lying placenta, and ectopic pregnancy. Per 06/11/2018 OB ultrasound, there was no evidence of placenta previa or abruption. She was initially seen in [facility] for contractions with cervical dilation, and was measured at ___ cm.   Patient HPI in labor and delivery complicated by altered level of consciousness due to IV Fentanyl.  Per the patient's support and baby's father, Valerie Taylor, Valerie Taylor began having contractions around 16:30 PM on 06/17/2018. Around 01:30 AM on 06/18/2018 the patient had some spotting, but no significant bleeding or rupture of membranes. The patient endorses feeling "hot," and denies headaches, or visual disturbances.   OB History    Gravida  3   Para      Term      Preterm      AB  2   Living  0     SAB  1   TAB      Ectopic  1   Multiple      Live Births  0        Obstetric Comments  2004 per pt        Past Medical History:  Diagnosis Date  . Headache   . Infection    UTI  . Kidney stones    Past Surgical History:  Procedure Laterality Date  . NO PAST SURGERIES     Family History: family history includes Asthma in her sister; Cancer in her maternal aunt; Healthy in her father and mother. Social History:  reports that she has never smoked. She has never used smokeless tobacco. She reports that she does not drink alcohol or use drugs.     Maternal Diabetes: No Genetic Screening: Declined Maternal Ultrasounds/Referrals: Normal Fetal Ultrasounds or other Referrals:  None Maternal Substance Abuse:  No Significant Maternal Medications:  None Significant Maternal Lab Results:  None Other Comments:  None  Review of Systems  Constitutional: Negative for fever.  Eyes: Negative for blurred vision, double vision and photophobia.  Genitourinary:       Endorses spotting.  Neurological: Negative for headaches.    History Dilation: 5 Effacement (%): 90 Station: -2, -1 Blood pressure 130/82, pulse 81, temperature 97.8 F (36.6 C), temperature source Oral, resp. rate (!) 22, last menstrual period 09/17/2017. Exam Physical Exam  Cardiac: Normal rate and rhythm.  Pulmonary: CTAB with no adventitious breath sounds.\ Extremities: No lower extremity edema, erythema, warmth or cording. Negative Homan's sign. FHT: Baseline 120/mod var/+ acels/no decels Uterine activity: regular contractions q2-66m Prenatal labs: ABO, Rh: B/Positive/-- (09/05 1531) Antibody: Negative (09/05 1531) Rubella: 5.80 (09/05 1531) RPR: Non Reactive (01/02 1015)  HBsAg: Negative (09/05 1531)  HIV: Non Reactive (01/02 1015)  GBS:   Negative  Assessment/Plan: Labor: Spontaneous. No need to augment at this time.  Fetal Wellbeing: Cat 1 ID: GBS negative MOF: breast MOC: undecided Circumcision: NA (girl)  Zettie Cooley 06/18/2018, 5:28 AM  OB FELLOW HISTORY AND PHYSICAL ATTESTATION  I have seen and examined this patient; I agree with above documentation in the student's note.   Gwenevere Abbot, MD   OB Fellow  06/18/2018, 6:15 AM

## 2018-06-18 NOTE — Discharge Instructions (Signed)
Vaginal Delivery, Care After °Refer to this sheet in the next few weeks. These instructions provide you with information about caring for yourself after vaginal delivery. Your health care provider may also give you more specific instructions. Your treatment has been planned according to current medical practices, but problems sometimes occur. Call your health care provider if you have any problems or questions. °What can I expect after the procedure? °After vaginal delivery, it is common to have: °· Some bleeding from your vagina. °· Soreness in your abdomen, your vagina, and the area of skin between your vaginal opening and your anus (perineum). °· Pelvic cramps. °· Fatigue. °Follow these instructions at home: °Medicines °· Take over-the-counter and prescription medicines only as told by your health care provider. °· If you were prescribed an antibiotic medicine, take it as told by your health care provider. Do not stop taking the antibiotic until it is finished. °Driving ° °· Do not drive or operate heavy machinery while taking prescription pain medicine. °· Do not drive for 24 hours if you received a sedative. °Lifestyle °· Do not drink alcohol. This is especially important if you are breastfeeding or taking medicine to relieve pain. °· Do not use tobacco products, including cigarettes, chewing tobacco, or e-cigarettes. If you need help quitting, ask your health care provider. °Eating and drinking °· Drink at least 8 eight-ounce glasses of water every day unless you are told not to by your health care provider. If you choose to breastfeed your baby, you may need to drink more water than this. °· Eat high-fiber foods every day. These foods may help prevent or relieve constipation. High-fiber foods include: °? Whole grain cereals and breads. °? Brown rice. °? Beans. °? Fresh fruits and vegetables. °Activity °· Return to your normal activities as told by your health care provider. Ask your health care provider what  activities are safe for you. °· Rest as much as possible. Try to rest or take a nap when your baby is sleeping. °· Do not lift anything that is heavier than your baby or 10 lb (4.5 kg) until your health care provider says that it is safe. °· Talk with your health care provider about when you can engage in sexual activity. This may depend on your: °? Risk of infection. °? Rate of healing. °? Comfort and desire to engage in sexual activity. °Vaginal Care °· If you have an episiotomy or a vaginal tear, check the area every day for signs of infection. Check for: °? More redness, swelling, or pain. °? More fluid or blood. °? Warmth. °? Pus or a bad smell. °· Do not use tampons or douches until your health care provider says this is safe. °· Watch for any blood clots that may pass from your vagina. These may look like clumps of dark red, brown, or black discharge. °General instructions °· Keep your perineum clean and dry as told by your health care provider. °· Wear loose, comfortable clothing. °· Wipe from front to back when you use the toilet. °· Ask your health care provider if you can shower or take a bath. If you had an episiotomy or a perineal tear during labor and delivery, your health care provider may tell you not to take baths for a certain length of time. °· Wear a bra that supports your breasts and fits you well. °· If possible, have someone help you with household activities and help care for your baby for at least a few days after you   leave the hospital. °· Keep all follow-up visits for you and your baby as told by your health care provider. This is important. °Contact a health care provider if: °· You have: °? Vaginal discharge that has a bad smell. °? Difficulty urinating. °? Pain when urinating. °? A sudden increase or decrease in the frequency of your bowel movements. °? More redness, swelling, or pain around your episiotomy or vaginal tear. °? More fluid or blood coming from your episiotomy or vaginal  tear. °? Pus or a bad smell coming from your episiotomy or vaginal tear. °? A fever. °? A rash. °? Little or no interest in activities you used to enjoy. °? Questions about caring for yourself or your baby. °· Your episiotomy or vaginal tear feels warm to the touch. °· Your episiotomy or vaginal tear is separating or does not appear to be healing. °· Your breasts are painful, hard, or turn red. °· You feel unusually sad or worried. °· You feel nauseous or you vomit. °· You pass large blood clots from your vagina. If you pass a blood clot from your vagina, save it to show to your health care provider. Do not flush blood clots down the toilet without having your health care provider look at them. °· You urinate more than usual. °· You are dizzy or light-headed. °· You have not breastfed at all and you have not had a menstrual period for 12 weeks after delivery. °· You have stopped breastfeeding and you have not had a menstrual period for 12 weeks after you stopped breastfeeding. °Get help right away if: °· You have: °? Pain that does not go away or does not get better with medicine. °? Chest pain. °? Difficulty breathing. °? Blurred vision or spots in your vision. °? Thoughts about hurting yourself or your baby. °· You develop pain in your abdomen or in one of your legs. °· You develop a severe headache. °· You faint. °· You bleed from your vagina so much that you fill two sanitary pads in one hour. °This information is not intended to replace advice given to you by your health care provider. Make sure you discuss any questions you have with your health care provider. °Document Released: 03/17/2000 Document Revised: 09/01/2015 Document Reviewed: 04/04/2015 °Elsevier Interactive Patient Education © 2019 Elsevier Inc. ° °

## 2018-06-18 NOTE — Discharge Summary (Addendum)
Obstetrics Discharge Summary OB/GYN Faculty Practice   Patient Name: Valerie Taylor DOB: 1985-07-26 MRN: [id number]  Date of admission: 06/18/2018 Delivering MD: Dorathy Kinsman   Date of discharge: 06/20/2018 Admitting diagnosis: 39 WKS, CTX Intrauterine pregnancy: [redacted]w[redacted]d     Secondary diagnosis:   Active Problems:   Normal labor  Additional problems:  . Anemia     Discharge diagnosis: Term Pregnancy Delivered and Anemia                                            Postpartum procedures: None   Complications: Shoulder dystocia, Episiotomy  Hospital course: Valerie Taylor is a 33 y.o. [redacted]w[redacted]d who was admitted for SOL. Her pregnancy was complicated by anemia and tachycardia. Her labor course was notable for AROM. Delivery was complicated by shoulder dystocia requiring episiotomy (2nd degree). Please see delivery/op note for additional details. Her postpartum course was uncomplicated. She was breastfeeding without difficulty. By day of discharge, she was passing flatus, urinating, eating and drinking without difficulty. Her pain was well-controlled, and she was discharged home with tylenol and ibuprofen. She will follow-up in clinic in 4 weeks.   Physical exam  Vitals:   06/19/18 0549 06/19/18 1300 06/19/18 2130 06/20/18 0544  BP: 105/72 111/75 115/75 (!) 106/55  Pulse: 72 79 79 80  Resp: 18 19 16 18   Temp: 97.9 F (36.6 C) 97.9 F (36.6 C) (!) 97.5 F (36.4 C) 97.9 F (36.6 C)  TempSrc: Oral Oral Oral Oral  SpO2:  100%     General: well appearing, no distress Lochia: appropriate Uterine Fundus: firm Incision: N/A DVT Evaluation: No cords or calf tenderness. No significant calf/ankle edema. Labs: Lab Results  Component Value Date   WBC 12.3 (H) 06/18/2018   HGB 9.6 (L) 06/18/2018   HCT 31.6 (L) 06/18/2018   MCV 91.9 06/18/2018   PLT 281 06/18/2018   CMP Latest Ref Rng & Units 09/20/2017  Glucose 65 - 99 mg/dL 89  BUN 6 - 20 mg/dL 8  Creatinine 7.94 - 3.27 mg/dL 6.14   Sodium 709 - 295 mmol/L 137  Potassium 3.5 - 5.1 mmol/L 4.0  Chloride 101 - 111 mmol/L 106  CO2 22 - 32 mmol/L 22  Calcium 8.9 - 10.3 mg/dL 9.0  Total Protein 6.5 - 8.1 g/dL 7.4  Total Bilirubin 0.3 - 1.2 mg/dL 0.5  Alkaline Phos 38 - 126 U/L 68  AST 15 - 41 U/L 19  ALT 14 - 54 U/L 17    Discharge instructions: Per After Visit Summary and "Baby and Me Booklet"  After visit meds:  Allergies as of 06/20/2018   No Known Allergies     Medication List    TAKE these medications   acetaminophen 500 MG tablet Commonly known as:  TYLENOL Take 500 mg by mouth every 6 (six) hours as needed for headache.   ferrous sulfate 324 (65 Fe) MG Tbec Take 1 tablet (325 mg total) by mouth daily.   ibuprofen 600 MG tablet Commonly known as:  ADVIL,MOTRIN Take 1 tablet (600 mg total) by mouth every 6 (six) hours.   prenatal multivitamin Tabs tablet Take 1 tablet by mouth daily at 12 noon.       Postpartum contraception: Unsure despite several discussions, follow up at [facility] visit Diet: Routine Diet Activity: Advance as tolerated. Pelvic rest for 6 weeks.   Follow-up Appt: Future Appointments  Date Time Provider Department Center  06/21/2018 10:00 AM Marny Lowenstein, PA-C [facility] None   Follow-up Visit:  4 week PP visit   Newborn Data: Live born female  Birth Weight: 7 lb 12.2 oz (3520 g) APGAR: 2 / 5 / 7  Newborn Delivery   Birth date/time:  06/18/2018 15:37:00 Delivery type:  Vaginal, Spontaneous    Baby Feeding: Breast Disposition:home with mother  Burman Nieves, MD - PGY1 Faculty Service, Resident    OB FELLOW DISCHARGE ATTESTATION  I have seen and examined this patient and agree with above documentation in the resident's note.   Luna Audia,MD OB Fellow  06/21/2018, 10:12 AM

## 2018-06-18 NOTE — Anesthesia Preprocedure Evaluation (Signed)
Anesthesia Evaluation  Patient identified by MRN, date of birth, ID band Patient awake    Reviewed: Allergy & Precautions, NPO status , Patient's Chart, lab work & pertinent test results  Airway Mallampati: II  TM Distance: >3 FB Neck ROM: Full    Dental no notable dental hx. (+) Teeth Intact   Pulmonary neg pulmonary ROS,    Pulmonary exam normal breath sounds clear to auscultation       Cardiovascular Exercise Tolerance: Good Normal cardiovascular exam Rhythm:Regular Rate:Normal  Hx of Tachycardia   Neuro/Psych  Headaches,    GI/Hepatic   Endo/Other    Renal/GU      Musculoskeletal   Abdominal   Peds  Hematology  (+) anemia , Hgb 9.6 Plt 281   Anesthesia Other Findings   Reproductive/Obstetrics (+) Pregnancy                             Anesthesia Physical Anesthesia Plan  ASA: II  Anesthesia Plan: Epidural   Post-op Pain Management:    Induction:   PONV Risk Score and Plan:   Airway Management Planned:   Additional Equipment:   Intra-op Plan:   Post-operative Plan:   Informed Consent: I have reviewed the patients History and Physical, chart, labs and discussed the procedure including the risks, benefits and alternatives for the proposed anesthesia with the patient or authorized representative who has indicated his/her understanding and acceptance.       Plan Discussed with:   Anesthesia Plan Comments: (G3P2 for LEA)        Anesthesia Quick Evaluation

## 2018-06-18 NOTE — Progress Notes (Signed)
Patient ID: Valerie Taylor, female   DOB: 01-Mar-1986, 33 y.o.   MRN: [id number] Valerie Taylor is a 32 y.o. G3P0020 at [redacted]w[redacted]d.  Subjective: Comfortable w/ epidural   Objective: BP 116/82   Pulse 92   Temp 98.6 F (37 C) (Oral)   Resp 16   LMP 09/17/2017   SpO2 100%    FHT:  FHR: 115-120 bpm, variability: mod,  accelerations:  15x15 ,  decelerations:  repetive mild variables (improved w/ sitting upright. Prolonged decel to 90-100's w/ attempting to push. Pt rolled to left ES, recovering.  UC:   Q 2-4 minutes, strong, MVU's 180-200 Dilation: 10 Dilation Complete Date: 06/18/18 Dilation Complete Time: 1338 Station: Plus 1+ 2/ pushing Presentation: Vertex Exam by:: Dorathy Kinsman, CNM   Labs: NA  Assessment / Plan: [redacted]w[redacted]d week IUP Labor: Start second stage  Fetal Wellbeing:  Category II. Ut overall reassuring. Pitocin decreased to 3 Pain Control:  Epidural Anticipated MOD:  SVD vs VAVD.  Will update Dr./ Adrian Blackwater and let baby recover and try pushing in 30 min-1 hour. Discussed possible vacuum if baby does not tolerate pushing and/or doesn't progress quickly enough for baby to tolerate.   Katrinka Blazing, [place], CNM 06/18/2018 2:12 PM

## 2018-06-18 NOTE — Lactation Note (Signed)
This note was copied from a baby's chart. Lactation Consultation Note  Patient Name: [name] [id number] Date: 06/18/2018 Reason for consult: Initial assessment   P1 mom.  Infant was shoulder dystocia; code apgar, went to NICU, now is back on Mother baby with mom.  Infant in swaddled in bassinet.  Mom is going to try to feed at midnight.  LC offered assistance with feeding.  Mom agreed.  Mom is able to hand express easily.  Infant did suck gloved finger but when positioned at the breast would not open mouth to latch.  Infant is very sleepy.  Attempted on the other breast but infant would not wake to feed.  LC reviewed bf basics with mom and dad.  Mom knows to feed infant at least 8-12 times in 24 hours, STS often, hand exp. Prior to and after breastfeed.   LC answered mom's questions and encouraged her to attempt to feed with cues and to do STS.   Mom placed infant STS.  LC offered to assist mom with collecting colostrum in container at bedside.  Mom does not desire to do so at this time.  Lactation brochure reviewed.  OP services and phone line explained to mom.  LC encouraged mom to call out for further assistance with feeding/ latching.   Maternal Data Has patient been taught Hand Expression?: Yes Does the patient have breastfeeding experience prior to this delivery?: No  Feeding Feeding Type: Breast Fed  LATCH Score Latch: Too sleepy or reluctant, no latch achieved, no sucking elicited.  Audible Swallowing: None  Type of Nipple: Everted at rest and after stimulation  Comfort (Breast/Nipple): Soft / non-tender  Hold (Positioning): Assistance needed to correctly position infant at breast and maintain latch.  LATCH Score: 5  Interventions Interventions: Breast feeding basics reviewed;Assisted with latch;Skin to skin;Hand express;Position options;Support pillows;Adjust position  Lactation Tools Discussed/Used     Consult Status Consult Status: Follow-up Date:  06/19/18 Follow-up type: In-patient    Maryruth Hancock [facility] 06/18/2018, 10:21 PM

## 2018-06-18 NOTE — Progress Notes (Signed)
Patient ID: Valerie Taylor, female   DOB: 1985-09-28, 33 y.o.   MRN: [id number] Valerie Taylor is a 33 y.o. G3P0020 at [redacted]w[redacted]d.  Subjective: Comfortable w/ epidural  Objective: BP 115/77   Pulse 88   Temp 98 F (36.7 C) (Oral)   Resp 16   LMP 09/17/2017    FHT:  FHR: 125 bpm, variability: mod,  accelerations:  15x15,  decelerations:  Variables  UC:   ~Q 5 minutes, moderate by palpation. Difficult due to trace despite frequent adjustment.  Dilation: 8 Effacement (%): 90 Cervical Position: Anterior Station: 0 Presentation: Vertex Exam by:: Mullis/Resident Turned to right exaggerated Sims-->Prolonged decel to 90-100's--> turned back to left.   Labs: NA  Assessment / Plan: [redacted]w[redacted]d week IUP Labor: Transition Fetal Wellbeing:  Category II, overall reassuring, but will CTO closely. Amnioinfusion started.  Pain Control:  Epidural Anticipated MOD:  SVD  Katrinka Blazing [place], CNM 06/18/2018 9:56 AM

## 2018-06-18 NOTE — MAU Note (Signed)
CTX feels like they're back to back.  No LOF.  Some spotting.  + FM.

## 2018-06-18 NOTE — Anesthesia Procedure Notes (Signed)
Epidural Patient location during procedure: OB Start time: 06/18/2018 5:20 AM End time: 06/18/2018 5:35 AM  Staffing Anesthesiologist: Trevor Iha, MD Performed: anesthesiologist   Preanesthetic Checklist Completed: patient identified, site marked, surgical consent, pre-op evaluation, timeout performed, IV checked, risks and benefits discussed and monitors and equipment checked  Epidural Patient position: sitting Prep: site prepped and draped and DuraPrep Patient monitoring: continuous pulse ox and blood pressure Approach: midline Location: L3-L4 Injection technique: LOR air  Needle:  Needle type: Tuohy  Needle gauge: 17 G Needle length: 9 cm and 9 Needle insertion depth: 8 cm Catheter type: closed end flexible Catheter size: 19 Gauge Catheter at skin depth: 12 cm Test dose: negative  Assessment Events: blood not aspirated, injection not painful, no injection resistance, negative IV test and no paresthesia  Additional Notes Patient identified. Risks/Benefits/Options discussed with patient including but not limited to bleeding, infection, nerve damage, paralysis, failed block, incomplete pain control, headache, blood pressure changes, nausea, vomiting, reactions to medication both or allergic, itching and postpartum back pain. Confirmed with bedside nurse the patient's most recent platelet count. Confirmed with patient that they are not currently taking any anticoagulation, have any bleeding history or any family history of bleeding disorders. Patient expressed understanding and wished to proceed. All questions were answered. Sterile technique was used throughout the entire procedure. Please see nursing notes for vital signs. Test dose was given through epidural needle and negative prior to continuing to dose epidural or start infusion. Warning signs of high block given to the patient including shortness of breath, tingling/numbness in hands, complete motor block, or any  concerning symptoms with instructions to call for help. Patient was given instructions on fall risk and not to get out of bed. All questions and concerns addressed with instructions to call with any issues. 1 Attempt (S) . Patient tolerated procedure well.

## 2018-06-19 NOTE — Lactation Note (Signed)
This note was copied from a baby's chart. Lactation Consultation Note  Patient Name: Valerie Taylor [id number] Date: 06/19/2018 Reason for consult: Follow-up assessment;Infant weight loss;Term;Other (Comment)(transitioned from NICU yesterday )  Baby is 24 hours old  Last feeding was at 1424 for 20 mins. Latch score of 8 / per mom could hear swallows.  LC placed baby STS and recommended every feeding until the baby can stay awake for feedings.  Baby started to show feeding cues and LC offered to assist.  Baby fell back to sleep and LC left baby STS and encouraged mom to call with feeding cues.   Maternal Data    Feeding Feeding Type: Breast Fed  LATCH Score Latch: Too sleepy or reluctant, no latch achieved, no sucking elicited.  Audible Swallowing: None  Type of Nipple: Everted at rest and after stimulation  Comfort (Breast/Nipple): Soft / non-tender  Hold (Positioning): Assistance needed to correctly position infant at breast and maintain latch.  LATCH Score: 5  Interventions Interventions: Breast feeding basics reviewed;Assisted with latch;Skin to skin;Hand express;Breast compression;Adjust position;Support pillows;Position options  Lactation Tools Discussed/Used Tools: Pump Breast pump type: Double-Electric Breast Pump   Consult Status Consult Status: Follow-up Date: 06/19/18 Follow-up type: In-patient    Matilde Sprang Lilana Blasko 06/19/2018, 4:28 PM

## 2018-06-19 NOTE — Anesthesia Postprocedure Evaluation (Signed)
Anesthesia Post Note  Patient: Valerie Taylor  Procedure(s) Performed: AN AD HOC LABOR EPIDURAL     Patient location during evaluation: Mother Baby Anesthesia Type: Epidural Level of consciousness: awake and alert Pain management: pain level controlled Vital Signs Assessment: post-procedure vital signs reviewed and stable Respiratory status: spontaneous breathing, nonlabored ventilation and respiratory function stable Cardiovascular status: stable Postop Assessment: no headache, no backache, epidural receding, no apparent nausea or vomiting, patient able to bend at knees, able to ambulate and adequate PO intake Anesthetic complications: no    Last Vitals:  Vitals:   06/19/18 0100 06/19/18 0549  BP: 108/75 105/72  Pulse: 77 72  Resp: 18 18  Temp: 36.7 C 36.6 C  SpO2:      Last Pain:  Vitals:   06/19/18 0855  TempSrc:   PainSc: 0-No pain   Pain Goal:                   [place]

## 2018-06-19 NOTE — Progress Notes (Signed)
Post Partum Day 1 SVD with shoulder dystocia, and episiotomy. Subjective: no complaints, up ad lib, voiding, tolerating PO and + flatus. She has not had a bowel movement. Endorses pelvic pain 4/10, no clots, with 3-4 pad changes for bleeding. She is not feeling light headed, or fatigued. Denies cold intolerance, headaches, abdominal pain, visual disturbances.  Objective: Blood pressure 105/72, pulse 72, temperature 97.9 F (36.6 C), temperature source Oral, resp. rate 18, last menstrual period 09/17/2017, SpO2 100 %, unknown if currently breastfeeding.  Physical Exam:  General: alert, cooperative, appears stated age and no distress Lochia: appropriate Uterine Fundus: firm Pulmonary: CTAB Cardiac: RRR with no murmurs rubs or gallops. DVT Evaluation: No evidence of DVT seen on physical exam. Negative Homan's sign. No cords or calf tenderness. No significant calf/ankle edema.  Recent Labs    06/18/18 0415  HGB 9.6*  HCT 31.6*    Assessment/Plan: Assessment: per attending Plan: per attending Contraception plans not made.   LOS: 1 day   Valerie Taylor 06/19/2018, 8:09 AM

## 2018-06-20 MED ORDER — IBUPROFEN 600 MG PO TABS: 600.0000 mg | ORAL_TABLET | Freq: Four times a day (QID) | ORAL | 0 refills | Status: DC

## 2018-06-20 NOTE — Lactation Note (Signed)
This note was copied from a baby's chart. Lactation Consultation Note  Patient Name: Valerie Taylor [id number] Date: 06/20/2018 Reason for consult: Follow-up assessment;Term;Infant weight loss;Other (Comment)(5% weight loss / milk coming in bilaterally )  Baby is 42 hours old  LC reviewed the doc flow sheets.  As LC entered the room/ per mom the baby just fed for 5 mins and released.  LC offered to assist and mom receptive. LC STS/ LC showed mom how to  Obtain the depth  With latch and depth. Baby fed for 12 mins with flanged lips and consistent swallowing pattern.  Per mom comfortable. Baby released on her own and the nipple well rounded.  Sore nipple and engorgement prevention and tx reviewed.  LC instructed mom on the use of hand pump/ also shells Discussed nutritive vs non- nutritive feeding patterns and the importance of watching the  Baby for hanging out latched. Importance of STS feedings until the baby can stay awake Majority of feeding/ back to birth weight and gaining steadily. If the baby only feeds 1st breast  And the other breast is full / release down to comfort to protect the milk supply/ and prevent engorgement.  Mom denies sore nipples / breast are filling/ LC reassured mom that is a good sign/ milk coming to volume.  Sore nipple and engorgement prevention and tx reviewed.  Per mom has a DEBP - Motif at home.  Mother informed of post-discharge support and given phone number to the lactation department, including services for phone call assistance; out-patient appointments; and breastfeeding support group. List of other breastfeeding resources in the community given in the handout. Encouraged mother to call for problems or concerns related to breastfeeding.   Maternal Data Has patient been taught Hand Expression?: Yes(LC reiewed prior to latch - steady flow )  Feeding Feeding Type: Breast Fed  LATCH Score Latch: Grasps breast easily, tongue down, lips flanged,  rhythmical sucking.  Audible Swallowing: Spontaneous and intermittent  Type of Nipple: Everted at rest and after stimulation  Comfort (Breast/Nipple): Filling, red/small blisters or bruises, mild/mod discomfort  Hold (Positioning): Assistance needed to correctly position infant at breast and maintain latch.  LATCH Score: 8  Interventions Interventions: Breast feeding basics reviewed;Assisted with latch;Skin to skin;Breast massage;Breast compression;Adjust position;Support pillows;Position options;DEBP;Hand pump  Lactation Tools Discussed/Used Tools: Pump Breast pump type: Double-Electric Breast Pump;Manual Pump Review: Setup, frequency, and cleaning;Milk Storage Initiated by:: LC reviewed the hand pump - per mom has Motif DEBP at home    Consult Status Consult Status: Complete Date: 06/20/18    Matilde Sprang Carl Bleecker 06/20/2018, 10:01 AM

## 2018-06-21 ENCOUNTER — Encounter: Payer: BC Managed Care – PPO | Admitting: Medical

## 2018-06-21 ENCOUNTER — Ambulatory Visit: Payer: Self-pay

## 2018-06-21 NOTE — Lactation Note (Signed)
This note was copied from a baby's chart. Lactation Consultation Note  Patient Name: Valerie Taylor [id number] Date: 06/21/2018 Reason for consult: Follow-up assessment;Primapara;1st time breastfeeding;Infant weight loss;Term  Visited with P1 Mom of term baby at 4 hrs old.  Baby at 8% weight loss on day of discharge.  3 stools and 4 voids last 24 hrs.  Breasts are filling, and transitional milk easily expressed.  Baby getting her diaper changed and showing vigorous cues.  Mom agreeable to assist/assess of positioning and latching to breast.  Mom sat in chair, pillow added vertically behind her.  Baby positioned in football hold on right breast.  Assisted Mom to support her breast well, and support baby's head well.  Baby opens widely and assisted Mom to bring baby quickly to breast.  Latch looks deep and wide on breast.  Mom denies any pain with latch.  Encouraged Mom to relax her body with some deep breaths.  Baby noted to be relaxing more and regular swallows identified.    Encouraged Mom to hand express into spoon, and offer EBM as dessert or appetizer prn.  Mom has DEBP at home.  Reviewed importance of STS and feeding baby often with cues.   Engorgement prevention and treatment reviewed.   Mom aware of OP lactation support available and encouraged her to call.  Mom denies any questions currently.   Feeding Feeding Type: Breast Fed  LATCH Score Latch: Grasps breast easily, tongue down, lips flanged, rhythmical sucking.  Audible Swallowing: Spontaneous and intermittent  Type of Nipple: Everted at rest and after stimulation  Comfort (Breast/Nipple): Soft / non-tender  Hold (Positioning): Assistance needed to correctly position infant at breast and maintain latch.  LATCH Score: 9  Interventions Interventions: Breast feeding basics reviewed;Assisted with latch;Skin to skin;Breast massage;Hand express;Support pillows;Adjust position;Breast compression;Position options;Expressed  milk;DEBP  Lactation Tools Discussed/Used Tools: Pump   Consult Status Consult Status: Complete Date: 06/21/18 Follow-up type: Call as needed    Judee Clara 06/21/2018, 9:14 AM

## 2018-07-18 ENCOUNTER — Telehealth: Payer: BC Managed Care – PPO | Admitting: Family

## 2018-07-18 DIAGNOSIS — N760 Acute vaginitis: Secondary | ICD-10-CM

## 2018-07-18 MED ORDER — METRONIDAZOLE 500 MG PO TABS: 500.0000 mg | ORAL_TABLET | Freq: Two times a day (BID) | ORAL | 0 refills | Status: DC

## 2018-07-18 NOTE — Progress Notes (Signed)

## 2018-07-26 ENCOUNTER — Ambulatory Visit (INDEPENDENT_AMBULATORY_CARE_PROVIDER_SITE_OTHER): Payer: BC Managed Care – PPO

## 2018-07-26 ENCOUNTER — Other Ambulatory Visit: Payer: Self-pay

## 2018-07-26 NOTE — Progress Notes (Signed)
TELEHEALTH VIRTUAL POSTPARTUM VISIT ENCOUNTER NOTE  I connected with Valerie Taylor on 07/26/18 at  8:15 AM EDT by telephone at home and verified that I am speaking with the correct person using two identifiers.   I discussed the limitations, risks, security and privacy concerns of performing an evaluation and management service by telephone and the availability of in person appointments. I also discussed with the patient that there may be a patient responsible charge related to this service. The patient expressed understanding and agreed to proceed.  Appointment Date: 07/26/2018  OBGYN Clinic: [facility]  Chief Complaint:  Chief Complaint  Patient presents with  . Postpartum Care    History of Present Illness: Valerie Taylor is a 33 y.o. African-American [id number] (No LMP recorded.), seen for the above chief complaint. Her past medical history is significant for.   She is s/p normal spontaneous vaginal delivery on 06/18/18 at 39 weeks; she was discharged to home on 06/20/18 Pregnancy complicated by none. Baby is doing well.  Complains of no issues  Vaginal bleeding or discharge: bleeding just started back yesterday, none today Mode of feeding infant: Breast Intercourse: No  Contraception: no method PP depression s/s: No .  Any bowel or bladder issues: No  Pap smear: Patient reports pap done in 2019 and normal. Last done at previous provider. Not in epic  Review of Systems: Positive for none. Her 12 point review of systems is negative or as noted in the History of Present Illness.  Patient Active Problem List   Diagnosis Date Noted  . Normal labor 06/18/2018  . Low-lying placenta 01/31/2018  . Supervision of normal pregnancy 12/06/2017  . History of ectopic pregnancy 12/06/2017  . History of pregnancy loss in prior pregnancy, currently pregnant 12/06/2017    Medications Stephana Stear had no medications administered during this visit. Current Outpatient Medications   Medication Sig Dispense Refill  . acetaminophen (TYLENOL) 500 MG tablet Take 500 mg by mouth every 6 (six) hours as needed for headache.    . Prenatal Vit-Fe Fumarate-FA (PRENATAL MULTIVITAMIN) TABS tablet Take 1 tablet by mouth daily at 12 noon.    . ferrous sulfate 324 (65 Fe) MG TBEC Take 1 tablet (325 mg total) by mouth daily. (Patient not taking: Reported on 07/26/2018) 30 tablet 3  . ibuprofen (ADVIL,MOTRIN) 600 MG tablet Take 1 tablet (600 mg total) by mouth every 6 (six) hours. (Patient not taking: Reported on 07/26/2018) 30 tablet 0  . metroNIDAZOLE (FLAGYL) 500 MG tablet Take 1 tablet (500 mg total) by mouth 2 (two) times daily. (Patient not taking: Reported on 07/26/2018) 14 tablet 0   No current facility-administered medications for this visit.     Allergies Patient has no known allergies.  Physical Exam:  General:  Alert, oriented and cooperative.   Mental Status: Normal mood and affect perceived. Normal judgment and thought content.  Rest of physical exam deferred due to type of encounter  PP Depression Screening:  negative  Assessment:Patient is a 33 y.o. [id number] who is 5 weeks postpartum from a normal spontaneous vaginal delivery.  She is doing well.   Plan:  1. Postpartum care and examination    RTC: 1 year or as needed  I discussed the assessment and treatment plan with the patient. The patient was provided an opportunity to ask questions and all were answered. The patient agreed with the plan and demonstrated an understanding of the instructions.   The patient was advised to call back or seek an in-person  evaluation/go to the ED for any concerning postpartum symptoms.  I provided 5 minutes of non-face-to-face time during this encounter.   Rolm Bookbinderaroline M Neill, CNM [facility]

## 2018-08-07 ENCOUNTER — Telehealth: Payer: Self-pay

## 2018-08-07 NOTE — Telephone Encounter (Signed)
S/w pt and advised that return to work documents are ready for pick up.

## 2018-09-20 NOTE — Progress Notes (Signed)
Greater than 5 minutes, yet less than 10 minutes of time have been spent researching, coordinating, and implementing care for this patient today.  Thank you for the details you included in the comment boxes. Those details are very helpful in determining the best course of treatment for you and help us to provide the best care.  

## 2018-10-23 ENCOUNTER — Other Ambulatory Visit: Payer: Self-pay | Admitting: General Practice

## 2018-10-23 ENCOUNTER — Other Ambulatory Visit: Payer: Self-pay

## 2018-10-23 DIAGNOSIS — Z20822 Contact with and (suspected) exposure to covid-19: Secondary | ICD-10-CM

## 2018-10-27 LAB — NOVEL CORONAVIRUS, NAA: SARS-CoV-2, NAA: DETECTED — AB

## 2018-10-27 LAB — SPECIMEN STATUS REPORT

## 2018-11-18 ENCOUNTER — Telehealth: Payer: BC Managed Care – PPO | Admitting: Family

## 2018-11-18 DIAGNOSIS — B373 Candidiasis of vulva and vagina: Secondary | ICD-10-CM

## 2018-11-18 DIAGNOSIS — B3731 Acute candidiasis of vulva and vagina: Secondary | ICD-10-CM

## 2018-11-18 MED ORDER — FLUCONAZOLE 150 MG PO TABS: 150.0000 mg | ORAL_TABLET | ORAL | 0 refills | Status: DC | PRN

## 2018-11-18 NOTE — Progress Notes (Signed)
We are sorry that you are not feeling well. Here is how we plan to help! Based on what you shared with me it looks like you: May have a yeast vaginosis.  Approximately 5 minutes was spent documenting and reviewing patient's chart.    Vaginosis is an inflammation of the vagina that can result in discharge, itching and pain. The cause is usually a change in the normal balance of vaginal bacteria or an infection. Vaginosis can also result from reduced estrogen levels after menopause.  The most common causes of vaginosis are:   Bacterial vaginosis which results from an overgrowth of one on several organisms that are normally present in your vagina.   Yeast infections which are caused by a naturally occurring fungus called candida.   Vaginal atrophy (atrophic vaginosis) which results from the thinning of the vagina from reduced estrogen levels after menopause.   Trichomoniasis which is caused by a parasite and is commonly transmitted by sexual intercourse.  Factors that increase your risk of developing vaginosis include: . Medications, such as antibiotics and steroids . Uncontrolled diabetes . Use of hygiene products such as bubble bath, vaginal spray or vaginal deodorant . Douching . Wearing damp or tight-fitting clothing . Using an intrauterine device (IUD) for birth control . Hormonal changes, such as those associated with pregnancy, birth control pills or menopause . Sexual activity . Having a sexually transmitted infection  Your treatment plan is A single Diflucan (fluconazole) 150mg tablet once.  I have electronically sent this prescription into the pharmacy that you have chosen.  Be sure to take all of the medication as directed. Stop taking any medication if you develop a rash, tongue swelling or shortness of breath. Mothers who are breast feeding should consider pumping and discarding their breast milk while on these antibiotics. However, there is no consensus that infant exposure  at these doses would be harmful.  Remember that medication creams can weaken latex condoms. .   HOME CARE:  Good hygiene may prevent some types of vaginosis from recurring and may relieve some symptoms:  . Avoid baths, hot tubs and whirlpool spas. Rinse soap from your outer genital area after a shower, and dry the area well to prevent irritation. Don't use scented or harsh soaps, such as those with deodorant or antibacterial action. . Avoid irritants. These include scented tampons and pads. . Wipe from front to back after using the toilet. Doing so avoids spreading fecal bacteria to your vagina.  Other things that may help prevent vaginosis include:  . Don't douche. Your vagina doesn't require cleansing other than normal bathing. Repetitive douching disrupts the normal organisms that reside in the vagina and can actually increase your risk of vaginal infection. Douching won't clear up a vaginal infection. . Use a latex condom. Both female and female latex condoms may help you avoid infections spread by sexual contact. . Wear cotton underwear. Also wear pantyhose with a cotton crotch. If you feel comfortable without it, skip wearing underwear to bed. Yeast thrives in moist environments Your symptoms should improve in the next day or two.  GET HELP RIGHT AWAY IF:  . You have pain in your lower abdomen ( pelvic area or over your ovaries) . You develop nausea or vomiting . You develop a fever . Your discharge changes or worsens . You have persistent pain with intercourse . You develop shortness of breath, a rapid pulse, or you faint.  These symptoms could be signs of problems or infections that need to   be evaluated by a medical provider now.  MAKE SURE YOU    Understand these instructions.  Will watch your condition.  Will get help right away if you are not doing well or get worse.  Your e-visit answers were reviewed by a board certified advanced clinical practitioner to complete  your personal care plan. Depending upon the condition, your plan could have included both over the counter or prescription medications. Please review your pharmacy choice to make sure that you have choses a pharmacy that is open for you to pick up any needed prescription, Your safety is important to us. If you have drug allergies check your prescription carefully.   You can use MyChart to ask questions about today's visit, request a non-urgent call back, or ask for a work or school excuse for 24 hours related to this e-Visit. If it has been greater than 24 hours you will need to follow up with your provider, or enter a new e-Visit to address those concerns. You will get a MyChart message within the next two days asking about your experience. I hope that your e-visit has been valuable and will speed your recovery.  

## 2018-11-22 ENCOUNTER — Ambulatory Visit: Payer: BC Managed Care – PPO | Admitting: Cardiology

## 2018-12-06 ENCOUNTER — Ambulatory Visit (INDEPENDENT_AMBULATORY_CARE_PROVIDER_SITE_OTHER): Payer: BC Managed Care – PPO | Admitting: Family Medicine

## 2018-12-06 ENCOUNTER — Other Ambulatory Visit: Payer: Self-pay

## 2018-12-06 ENCOUNTER — Other Ambulatory Visit (HOSPITAL_COMMUNITY)
Admission: RE | Admit: 2018-12-06 | Discharge: 2018-12-06 | Disposition: A | Discharge status: Home / Self Care | Payer: BC Managed Care – PPO | Source: Ambulatory Visit | Attending: Family Medicine | Admitting: Family Medicine

## 2018-12-06 ENCOUNTER — Encounter: Payer: Self-pay | Admitting: Family Medicine

## 2018-12-06 VITALS — BP 105/72 | HR 98 | Ht 64.0 in | Wt 161.0 lb

## 2018-12-06 DIAGNOSIS — Z01419 Encounter for gynecological examination (general) (routine) without abnormal findings: Secondary | ICD-10-CM | POA: Insufficient documentation

## 2018-12-06 DIAGNOSIS — Z124 Encounter for screening for malignant neoplasm of cervix: Secondary | ICD-10-CM

## 2018-12-06 DIAGNOSIS — Z3202 Encounter for pregnancy test, result negative: Secondary | ICD-10-CM | POA: Diagnosis not present

## 2018-12-06 DIAGNOSIS — Z23 Encounter for immunization: Secondary | ICD-10-CM | POA: Diagnosis not present

## 2018-12-06 DIAGNOSIS — Z119 Encounter for screening for infectious and parasitic diseases, unspecified: Secondary | ICD-10-CM | POA: Insufficient documentation

## 2018-12-06 LAB — POCT URINE PREGNANCY: Preg Test, Ur: NEGATIVE

## 2018-12-06 MED ORDER — LACTINEX PO CHEW: 1.0000 | CHEWABLE_TABLET | Freq: Three times a day (TID) | ORAL | 3 refills | Status: DC

## 2018-12-06 NOTE — Progress Notes (Signed)
Patient presents for her Annual Exam today.  LMP: 11/21/18  Last pap: per pt maybe 2 yrs ago at location in [place], [place] pt unsure of name. Was WNL per pt no Hx of abnormal pap   Contraception: None pt does not desire any contraception at this time.   STD Screening: Full Panel  CC: Recurrent yeast infections. Pt notes she uses unscented Dove soap. In the past pt took Boric acid Cap to help with infections and is currently breastfeeding and wants to confirm can she take Boric Acid.

## 2018-12-06 NOTE — Progress Notes (Signed)
GYNECOLOGY ANNUAL PREVENTATIVE CARE ENCOUNTER NOTE  Subjective:   Valerie Taylor is a 33 y.o. G42P1021 female here for a annual gynecologic exam. Current complaints: none.   Denies abnormal vaginal bleeding, discharge, pelvic pain, problems with intercourse or other gynecologic concerns. She is currently breastfeeding and has no concerns for mastitis.   Gynecologic History Patient's last menstrual period was 11/21/2018 (exact date). Contraception: none Last Pap: last year in [place]. Results were: normal, but there is no documentation for this Last mammogram: n/a Gardisil: has not received, is interested in getting today  Obstetric History OB History  Gravida Para Term Preterm AB Living  3 1 1   2 1   SAB TAB Ectopic Multiple Live Births  1   1 0 1    # Outcome Date GA Lbr Len/2nd Weight Sex Delivery Anes PTL Lv  3 Term 06/18/18 [redacted]w[redacted]d 12:08 / 01:59 7 lb 12.2 oz (3.52 kg) F Vag-Spont EPI  LIV  2 Ectopic           1 SAB  [redacted]w[redacted]d           Obstetric Comments  2004 per pt     Past Medical History:  Diagnosis Date   Headache    History of ectopic pregnancy 12/06/2017   History of pregnancy loss in prior pregnancy, currently pregnant 12/06/2017   4 months loss- patient unclear of events Records requested   Infection    UTI   Kidney stones     Past Surgical History:  Procedure Laterality Date   NO PAST SURGERIES      Current Outpatient Medications on File Prior to Visit  Medication Sig Dispense Refill   acetaminophen (TYLENOL) 500 MG tablet Take 500 mg by mouth every 6 (six) hours as needed for headache.     ferrous sulfate 324 (65 Fe) MG TBEC Take 1 tablet (325 mg total) by mouth daily. (Patient not taking: Reported on 07/26/2018) 30 tablet 3   fluconazole (DIFLUCAN) 150 MG tablet Take 1 tablet (150 mg total) by mouth every three (3) days as needed. 3 tablet 0   ibuprofen (ADVIL,MOTRIN) 600 MG tablet Take 1 tablet (600 mg total) by mouth every 6 (six) hours.  (Patient not taking: Reported on 07/26/2018) 30 tablet 0   metroNIDAZOLE (FLAGYL) 500 MG tablet Take 1 tablet (500 mg total) by mouth 2 (two) times daily. (Patient not taking: Reported on 07/26/2018) 14 tablet 0   Prenatal Vit-Fe Fumarate-FA (PRENATAL MULTIVITAMIN) TABS tablet Take 1 tablet by mouth daily at 12 noon.     No current facility-administered medications on file prior to visit.     No Known Allergies  Social History   Socioeconomic History   Marital status: Single    Spouse name: Not on file   Number of children: Not on file   Years of education: Not on file   Highest education level: Not on file  Occupational History   Not on file  Social Needs   Financial resource strain: Not on file   Food insecurity    Worry: Not on file    Inability: Not on file   Transportation needs    Medical: Not on file    Non-medical: Not on file  Tobacco Use   Smoking status: Never Smoker   Smokeless tobacco: Never Used  Substance and Sexual Activity   Alcohol use: Never    Frequency: Never   Drug use: Never   Sexual activity: Yes    Partners: Male  Birth control/protection: None  Lifestyle   Physical activity    Days per week: Not on file    Minutes per session: Not on file   Stress: Not on file  Relationships   Social connections    Talks on phone: Not on file    Gets together: Not on file    Attends religious service: Not on file    Active member of club or organization: Not on file    Attends meetings of clubs or organizations: Not on file    Relationship status: Not on file   Intimate partner violence    Fear of current or ex partner: Not on file    Emotionally abused: Not on file    Physically abused: Not on file    Forced sexual activity: Not on file  Other Topics Concern   Not on file  Social History Narrative   Not on file    Family History  Problem Relation Age of Onset   Healthy Mother    Healthy Father    Asthma Sister     Cancer Maternal Aunt        breast    Diet: normal Exercise: no structured program, but is fairly active taking care of her daughter.  The following portions of the patient's history were reviewed and updated as appropriate: allergies, current medications, past family history, past medical history, past social history, past surgical history and problem list.  Review of Systems Constitutional: negative Eyes: negative Ears, nose, mouth, throat, and face: negative Respiratory: negative Cardiovascular: negative Gastrointestinal: negative Genitourinary:positive for recurrent yeast infections Integument/breast: negative, positive for current breastfeeding Hematologic/lymphatic: negative Musculoskeletal:negative Neurological: negative Behavioral/Psych: negative   Objective:  BP 105/72    Pulse 98    Ht 5\' 4"  (1.626 m)    Wt 161 lb (73 kg)    LMP 11/21/2018 (Exact Date)    Breastfeeding Yes    BMI 27.64 kg/m  CONSTITUTIONAL: Well-developed, well-nourished female in no acute distress.  HENT:  Normocephalic, atraumatic, External right and left ear normal. Oropharynx is clear and moist EYES: Conjunctivae and EOM are normal. Pupils are equal, round, and reactive to light. No scleral icterus.  NECK: Normal range of motion, supple, no masses.  Normal thyroid.  SKIN: Skin is warm and dry. No rash noted. Not diaphoretic. No erythema. No pallor. NEUROLOGIC: Alert and oriented to person, place, and time. Normal reflexes, muscle tone coordination. No cranial nerve deficit noted. PSYCHIATRIC: Normal mood and affect. Normal behavior. Normal judgment and thought content. CARDIOVASCULAR: Normal heart rate noted, regular rhythm RESPIRATORY: Clear to auscultation bilaterally. Effort and breath sounds normal, no problems with respiration noted. BREASTS: Symmetric in size. No masses, skin changes, nipple drainage, or lymphadenopathy. ABDOMEN: Soft, normal bowel sounds, no distention noted.  No tenderness,  rebound or guarding.  PELVIC: Normal appearing external genitalia; normal appearing vaginal mucosa and cervix.  No abnormal discharge noted.  Pap smear obtained.  Normal uterine size, no other palpable masses, no uterine or adnexal tenderness. MUSCULOSKELETAL: Normal range of motion. No tenderness.  No cyanosis, clubbing, or edema.  2+ distal pulses.  Exam done with chaperone present.  Assessment and Plan:  1. Encounter for well woman exam with routine gynecological exam - Medical, Surgical, and Family history reviewed - Urine preg negative, not interested in birth control - lactobacillus acidophilus & bulgar (LACTINEX) chewable tablet; Chew 1 tablet by mouth 3 (three) times daily with meals.  Dispense: 180 tablet; Refill: 3  2. Screening for cervical cancer  Pap collected today - Cytology - PAP( [place]) - Cervicovaginal ancillary only( [place])  3. Screening examination for infectious disease - Cervicovaginal ancillary only( [facility]) - Hepatitis B surface antigen - Hepatitis C antibody - HIV Antibody (routine testing w rflx) - RPR  4. Need for vaccination Educated on Gardasil, would like vaccination today - HPV 9-valent vaccine,Recombinat   Will follow up results of pap smear/STI screen and manage accordingly. Encouraged improvement in diet and exercise. - Flu vaccine not available today, educated she can get at next visit - Gardisil given in office today, will receive 2nd and 3rd doses in 2 and 6 months, respectively  Routine preventative health maintenance measures emphasized. Please refer to After Visit Summary for other counseling recommendations.   Total face-to-face time with patient: 26 minutes. Over 50% of encounter was spent on counseling and coordination of care.  Marlowe AltHailey L Kalimah Capurro, DO [facility] 12/06/18, 9:38 AM

## 2018-12-06 NOTE — Patient Instructions (Addendum)
Pap Test Why am I having this test? A Pap test, also called a Pap smear, is a screening test to check for signs of:  Cancer of the vagina, cervix, and uterus. The cervix is the lower part of the uterus that opens into the vagina.  Infection.  Changes that may be a sign that cancer is developing (precancerous changes). Women need this test on a regular basis. In general, you should have a Pap test every 3 years until you reach menopause or age 33. Women aged 30-60 may choose to have their Pap test done at the same time as an HPV (human papillomavirus) test every 5 years (instead of every 3 years). Your health care provider may recommend having Pap tests more or less often depending on your medical conditions and past Pap test results. What kind of sample is taken?  Your health care provider will collect a sample of cells from the surface of your cervix. This will be done using a small cotton swab, plastic spatula, or brush. This sample is often collected during a pelvic exam, when you are lying on your back on an exam table with feet in footrests (stirrups). In some cases, fluids (secretions) from the cervix or vagina may also be collected. How do I prepare for this test?  Be aware of where you are in your menstrual cycle. If you are menstruating on the day of the test, you may be asked to reschedule.  You may need to reschedule if you have a known vaginal infection on the day of the test.  Follow instructions from your health care provider about: ? Changing or stopping your regular medicines. Some medicines can cause abnormal test results, such as digitalis and tetracycline. ? Avoiding douching or taking a bath the day before or the day of the test. Tell a health care provider about:  Any allergies you have.  All medicines you are taking, including vitamins, herbs, eye drops, creams, and over-the-counter medicines.  Any blood disorders you have.  Any surgeries you have had.  Any  medical conditions you have.  Whether you are pregnant or may be pregnant. How are the results reported? Your test results will be reported as either abnormal or normal. A false-positive result can occur. A false positive is incorrect because it means that a condition is present when it is not. A false-negative result can occur. A false negative is incorrect because it means that a condition is not present when it is. What do the results mean? A normal test result means that you do not have signs of cancer of the vagina, cervix, or uterus. An abnormal result may mean that you have:  Cancer. A Pap test by itself is not enough to diagnose cancer. You will have more tests done in this case.  Precancerous changes in your vagina, cervix, or uterus.  Inflammation of the cervix.  An STD (sexually transmitted disease).  A fungal infection.  A parasite infection. Talk with your health care provider about what your results mean. Questions to ask your health care provider Ask your health care provider, or the department that is doing the test:  When will my results be ready?  How will I get my results?  What are my treatment options?  What other tests do I need?  What are my next steps? Summary  In general, women should have a Pap test every 3 years until they reach menopause or age 33.  Your health care provider will collect a   sample of cells from the surface of your cervix. This will be done using a small cotton swab, plastic spatula, or brush.  In some cases, fluids (secretions) from the cervix or vagina may also be collected. This information is not intended to replace advice given to you by your health care provider. Make sure you discuss any questions you have with your health care provider. Document Released: 06/10/2002 Document Revised: 11/27/2016 Document Reviewed: 11/27/2016 Elsevier Patient Education  [place].    Human Papillomavirus Quadrivalent Vaccine  suspension for injection What is this medicine? HUMAN PAPILLOMAVIRUS VACCINE (HYOO muhn pap uh LOH muh vahy ruhs vak SEEN) is a vaccine. It is used to prevent infections of four types of the human papillomavirus. In women, the vaccine may lower your risk of getting cervical, vaginal, vulvar, or anal cancer and genital warts. In men, the vaccine may lower your risk of getting genital warts and anal cancer. You cannot get these diseases from the vaccine. This vaccine does not treat these diseases. This medicine may be used for other purposes; ask your health care provider or pharmacist if you have questions. COMMON BRAND NAME(S): Gardasil What should I tell my health care provider before I take this medicine? They need to know if you have any of these conditions:  fever or infection  hemophilia  HIV infection or AIDS  immune system problems  low platelet count  an unusual reaction to Human Papillomavirus Vaccine, yeast, other medicines, foods, dyes, or preservatives  pregnant or trying to get pregnant  breast-feeding How should I use this medicine? This vaccine is for injection in a muscle on your upper arm or thigh. It is given by a health care professional. Dennis Bast will be observed for 15 minutes after each dose. Sometimes, fainting happens after the vaccine is given. You may be asked to sit or lie down during the 15 minutes. Three doses are given. The second dose is given 2 months after the first dose. The last dose is given 4 months after the second dose. A copy of a Vaccine Information Statement will be given before each vaccination. Read this sheet carefully each time. The sheet may change frequently. Talk to your pediatrician regarding the use of this medicine in children. While this drug may be prescribed for children as young as 68 years of age for selected conditions, precautions do apply. Overdosage: If you think you have taken too much of this medicine contact a poison control center  or emergency room at once. NOTE: This medicine is only for you. Do not share this medicine with others. What if I miss a dose? All 3 doses of the vaccine should be given within 6 months. Remember to keep appointments for follow-up doses. Your health care provider will tell you when to return for the next vaccine. Ask your health care professional for advice if you are unable to keep an appointment or miss a scheduled dose. What may interact with this medicine?  other vaccines This list may not describe all possible interactions. Give your health care provider a list of all the medicines, herbs, non-prescription drugs, or dietary supplements you use. Also tell them if you smoke, drink alcohol, or use illegal drugs. Some items may interact with your medicine. What should I watch for while using this medicine? This vaccine may not fully protect everyone. Continue to have regular pelvic exams and cervical or anal cancer screenings as directed by your doctor. The Human Papillomavirus is a sexually transmitted disease. It  can be passed by any kind of sexual activity that involves genital contact. The vaccine works best when given before you have any contact with the virus. Many people who have the virus do not have any signs or symptoms. Tell your doctor or health care professional if you have any reaction or unusual symptom after getting the vaccine. What side effects may I notice from receiving this medicine? Side effects that you should report to your doctor or health care professional as soon as possible:  allergic reactions like skin rash, itching or hives, swelling of the face, lips, or tongue  breathing problems  feeling faint or lightheaded, falls Side effects that usually do not require medical attention (report to your doctor or health care professional if they continue or are bothersome):  cough  dizziness  fever  headache  nausea  redness, warmth, swelling, pain, or itching at  site where injected This list may not describe all possible side effects. Call your doctor for medical advice about side effects. You may report side effects to FDA at 1-800-FDA-1088. Where should I keep my medicine? This drug is given in a hospital or clinic and will not be stored at home. NOTE: This sheet is a summary. It may not cover all possible information. If you have questions about this medicine, talk to your doctor, pharmacist, or health care provider.  2020 Elsevier/Gold Standard (2013-05-12 13:14:33)

## 2018-12-07 LAB — RPR: RPR Ser Ql: NONREACTIVE

## 2018-12-07 LAB — HIV ANTIBODY (ROUTINE TESTING W REFLEX): HIV Screen 4th Generation wRfx: NONREACTIVE

## 2018-12-07 LAB — HEPATITIS B SURFACE ANTIGEN: Hepatitis B Surface Ag: NEGATIVE

## 2018-12-07 LAB — HEPATITIS C ANTIBODY: Hep C Virus Ab: <0.1 s/co ratio (ref 0.0–0.9)

## 2018-12-11 ENCOUNTER — Other Ambulatory Visit: Payer: Self-pay

## 2018-12-11 DIAGNOSIS — Z20822 Contact with and (suspected) exposure to covid-19: Secondary | ICD-10-CM

## 2018-12-11 LAB — CERVICOVAGINAL ANCILLARY ONLY
Bacterial vaginitis: NEGATIVE
Candida vaginitis: NEGATIVE
Chlamydia: NEGATIVE
Neisseria Gonorrhea: NEGATIVE
Trichomonas: NEGATIVE

## 2018-12-11 LAB — CYTOLOGY - PAP
Diagnosis: NEGATIVE
HPV 16/18/45 genotyping: NEGATIVE
HPV: DETECTED — AB

## 2018-12-12 LAB — NOVEL CORONAVIRUS, NAA: SARS-CoV-2, NAA: NOT DETECTED

## 2018-12-23 ENCOUNTER — Telehealth: Payer: Self-pay

## 2018-12-23 NOTE — Telephone Encounter (Signed)
Return call to pt regarding message Pt not ava left detailed message for pt to return call to office.

## 2019-01-02 ENCOUNTER — Telehealth: Payer: Self-pay

## 2019-01-02 NOTE — Telephone Encounter (Signed)
  Spoke with pt to get get clarification regarding Forms submitted 12/30/18. I advised pt that we are unable to complete Forms to keep her out of work due to the [employer]; she should get her PCP to do so. Patient is no longer pregnant, she delivered in 06/2018, had her PP check up 07/26/18 and the Paperwork for that pregnancy, delivery and PP recovery was already completed and faxed in May 2020.   Patient verbalized understanding and said that she would pick her Forms up from [place].

## 2019-02-05 ENCOUNTER — Ambulatory Visit: Payer: BC Managed Care – PPO

## 2019-02-13 ENCOUNTER — Telehealth: Payer: Self-pay | Admitting: Obstetrics

## 2019-07-04 ENCOUNTER — Other Ambulatory Visit: Payer: Self-pay | Admitting: Family Medicine

## 2019-07-04 DIAGNOSIS — Z01419 Encounter for gynecological examination (general) (routine) without abnormal findings: Secondary | ICD-10-CM

## 2020-01-05 ENCOUNTER — Other Ambulatory Visit: Payer: Self-pay

## 2020-01-05 ENCOUNTER — Emergency Department (HOSPITAL_COMMUNITY)
Admission: EM | Admit: 2020-01-05 | Discharge: 2020-01-05 | Disposition: A | Discharge status: Home / Self Care | Payer: 59 | Attending: Emergency Medicine | Admitting: Emergency Medicine

## 2020-01-05 ENCOUNTER — Emergency Department (HOSPITAL_COMMUNITY): Payer: 59

## 2020-01-05 ENCOUNTER — Encounter (HOSPITAL_COMMUNITY): Payer: Self-pay | Admitting: Obstetrics and Gynecology

## 2020-01-05 DIAGNOSIS — Z3A01 Less than 8 weeks gestation of pregnancy: Secondary | ICD-10-CM | POA: Diagnosis not present

## 2020-01-05 DIAGNOSIS — O00102 Left tubal pregnancy without intrauterine pregnancy: Secondary | ICD-10-CM

## 2020-01-05 DIAGNOSIS — R1030 Lower abdominal pain, unspecified: Secondary | ICD-10-CM | POA: Diagnosis not present

## 2020-01-05 LAB — HCG, QUANTITATIVE, PREGNANCY: hCG, Beta Chain, Quant, S: 7484 m[IU]/mL — ABNORMAL HIGH (ref <5)

## 2020-01-05 LAB — COMPREHENSIVE METABOLIC PANEL
ALT: 12 U/L (ref 0–44)
AST: 18 U/L (ref 15–41)
Albumin: 4.1 g/dL (ref 3.5–5.0)
Alkaline Phosphatase: 72 U/L (ref 38–126)
Anion gap: 11 (ref 5–15)
BUN: 15 mg/dL (ref 6–20)
CO2: 22 mmol/L (ref 22–32)
Calcium: 9 mg/dL (ref 8.9–10.3)
Chloride: 102 mmol/L (ref 98–111)
Creatinine, Ser: 0.73 mg/dL (ref 0.44–1.00)
GFR calc Af Amer: >60 mL/min (ref >60)
GFR calc non Af Amer: >60 mL/min (ref >60)
Glucose, Bld: 91 mg/dL (ref 70–99)
Potassium: 4.1 mmol/L (ref 3.5–5.1)
Sodium: 135 mmol/L (ref 135–145)
Total Bilirubin: 0.4 mg/dL (ref 0.3–1.2)
Total Protein: 7.7 g/dL (ref 6.5–8.1)

## 2020-01-05 LAB — CBC
HCT: 37.1 % (ref 36.0–46.0)
Hemoglobin: 12.3 g/dL (ref 12.0–15.0)
MCH: 31.1 pg (ref 26.0–34.0)
MCHC: 33.2 g/dL (ref 30.0–36.0)
MCV: 93.7 fL (ref 80.0–100.0)
Platelets: 294 10*3/uL (ref 150–400)
RBC: 3.96 MIL/uL (ref 3.87–5.11)
RDW: 13.4 % (ref 11.5–15.5)
WBC: 9.6 10*3/uL (ref 4.0–10.5)
nRBC: 0 % (ref 0.0–0.2)

## 2020-01-05 LAB — URINALYSIS, ROUTINE W REFLEX MICROSCOPIC
Bilirubin Urine: NEGATIVE
Glucose, UA: NEGATIVE mg/dL
Hgb urine dipstick: NEGATIVE
Ketones, ur: NEGATIVE mg/dL
Leukocytes,Ua: NEGATIVE
Nitrite: NEGATIVE
Protein, ur: NEGATIVE mg/dL
Specific Gravity, Urine: 1.02 (ref 1.005–1.030)
pH: 5 (ref 5.0–8.0)

## 2020-01-05 LAB — LIPASE, BLOOD: Lipase: 25 U/L (ref 11–51)

## 2020-01-05 MED ORDER — METHOTREXATE SODIUM CHEMO INJECTION 50 MG/2ML: 50.0000 mg/m2 | Freq: Once | INTRAMUSCULAR | Status: DC

## 2020-01-05 MED ORDER — METHOTREXATE FOR ECTOPIC PREGNANCY
50.0000 mg/m2 | Freq: Once | INTRAMUSCULAR | Status: DC
  Filled 2020-01-05: qty 1

## 2020-01-05 MED ORDER — METHOTREXATE SODIUM CHEMO INJECTION 50 MG/2ML
50.0000 mg/m2 | Freq: Once | INTRAMUSCULAR | Status: AC
  Administered 2020-01-05: 92.5 mg via INTRAMUSCULAR
  Filled 2020-01-05: qty 3.7

## 2020-01-05 NOTE — Discharge Instructions (Addendum)
As discussed, you have been diagnosed with an ectopic pregnancy.  You have received the initial medication, and need to follow-up with our obstetrics colleagues for appropriate ongoing management.  Return here or to our women's hospital for any concerning changes at all.

## 2020-01-05 NOTE — ED Provider Notes (Signed)
[facility]-EMERGENCY DEPT Provider Note   CSN: [id number] Arrival date & time: 01/05/20  0818     History Chief Complaint  Patient presents with  . Abdominal Pain  . Possible Pregnancy    Valerie Taylor is a 34 y.o. female.  HPI     Patient presents concern of suprapubic pain. Pain began today, suddenly, without clear precipitant. Notably, the patient took a home pregnancy test 2 days ago, and it was positive. Last menstrual period was 5 weeks ago. Patient has a history of 1 completed pregnancy, at least 1 additional pregnancy, with chart review noting ectopic pregnancy in 2019. Patient denies fever, vomiting, diarrhea.  No medication taken for relief.  Pain is persistent, moderate, sharp.   Past Medical History:  Diagnosis Date  . Headache   . History of ectopic pregnancy 12/06/2017  . History of pregnancy loss in prior pregnancy, currently pregnant 12/06/2017   4 months loss- patient unclear of events Records requested  . Infection    UTI  . Kidney stones     Patient Active Problem List   Diagnosis Date Noted  . Encounter for well woman exam with routine gynecological exam 12/06/2018  . Screening examination for infectious disease 12/06/2018  . Need for vaccination 12/06/2018    Past Surgical History:  Procedure Laterality Date  . NO PAST SURGERIES       OB History    Gravida  4   Para  1   Term  1   Preterm      AB  2   Living  1     SAB  1   TAB      Ectopic  1   Multiple  0   Live Births  1        Obstetric Comments  2004 per pt         Family History  Problem Relation Age of Onset  . Healthy Mother   . Healthy Father   . Asthma Sister   . Cancer Maternal Aunt        breast    Social History   Tobacco Use  . Smoking status: Never Smoker  . Smokeless tobacco: Never Used  Vaping Use  . Vaping Use: Never used  Substance Use Topics  . Alcohol use: Never  . Drug use: Never    Home  Medications Prior to Admission medications   Medication Sig Start Date End Date Taking? Authorizing Provider  acetaminophen (TYLENOL) 500 MG tablet Take 500 mg by mouth every 6 (six) hours as needed for headache.    [provider]  ferrous sulfate 324 (65 Fe) MG TBEC Take 1 tablet (325 mg total) by mouth daily. Patient not taking: Reported on 07/26/2018 05/20/18   Marny LowensteinWenzel, Julie N, PA-C  fluconazole (DIFLUCAN) 150 MG tablet Take 1 tablet (150 mg total) by mouth every three (3) days as needed. 11/18/18   Junie SpencerHawks, Christy A, FNP  ibuprofen (ADVIL,MOTRIN) 600 MG tablet Take 1 tablet (600 mg total) by mouth every 6 (six) hours. Patient not taking: Reported on 07/26/2018 06/20/18   Lebron QuamAbraham, Julia A, MD  lactobacillus acidophilus & bulgar (LACTINEX) chewable tablet Chew 1 tablet by mouth 3 (three) times daily with meals. 12/06/18 03/06/19  Sparacino, Hailey L, DO  metroNIDAZOLE (FLAGYL) 500 MG tablet Take 1 tablet (500 mg total) by mouth 2 (two) times daily. Patient not taking: Reported on 07/26/2018 07/18/18   Eulis FosterWebb, Padonda B, FNP  Prenatal Vit-Fe Fumarate-FA (PRENATAL MULTIVITAMIN)  TABS tablet Take 1 tablet by mouth daily at 12 noon.    [provider]    Allergies    Patient has no known allergies.  Review of Systems   Review of Systems  Constitutional:       Per HPI, otherwise negative  HENT:       Per HPI, otherwise negative  Respiratory:       Per HPI, otherwise negative  Cardiovascular:       Per HPI, otherwise negative  Gastrointestinal: Negative for vomiting.  Endocrine:       Negative aside from HPI  Genitourinary:       Neg aside from HPI   Musculoskeletal:       Per HPI, otherwise negative  Skin: Negative.   Neurological: Negative for syncope.    Physical Exam Updated Vital Signs BP 115/62 (BP Location: Left Arm)   Pulse 78   Temp 98.9 F (37.2 C) (Oral)   Resp (!) 25   Ht 5\' 4"  (1.626 m)   Wt 77.1 kg   LMP 11/28/2019 (Exact Date)   SpO2 100%    Breastfeeding Yes   BMI 29.18 kg/m   Physical Exam Vitals and nursing note reviewed.  Constitutional:      General: She is not in acute distress.    Appearance: She is well-developed.  HENT:     Head: Normocephalic and atraumatic.  Eyes:     Conjunctiva/sclera: Conjunctivae normal.  Cardiovascular:     Rate and Rhythm: Normal rate and regular rhythm.  Pulmonary:     Effort: Pulmonary effort is normal. No respiratory distress.     Breath sounds: Normal breath sounds. No stridor.  Abdominal:     General: There is no distension.     Tenderness: There is abdominal tenderness in the suprapubic area.  Skin:    General: Skin is warm and dry.  Neurological:     Mental Status: She is alert and oriented to person, place, and time.     Cranial Nerves: No cranial nerve deficit.     ED Results / Procedures / Treatments   Labs (all labs ordered are listed, but only abnormal results are displayed) Labs Reviewed  HCG, QUANTITATIVE, PREGNANCY - Abnormal; Notable for the following components:      Result Value   hCG, Beta Chain, Quant, S 7,484 (*)    All other components within normal limits  URINALYSIS, ROUTINE W REFLEX MICROSCOPIC  COMPREHENSIVE METABOLIC PANEL  LIPASE, BLOOD  CBC    Radiology 11/30/2019 OB Comp < 14 Wks  Result Date: 01/05/2020 CLINICAL DATA:  Abdominal pain, first trimester of pregnancy. EXAM: OBSTETRIC <14 WK 03/06/2020 AND TRANSVAGINAL OB US TECHNIQUE: Both transabdominal and transvaginal ultrasound examinations were performed for complete evaluation of the gestation as well as the maternal uterus, adnexal regions, and pelvic cul-de-sac. Transvaginal technique was performed to assess early pregnancy. COMPARISON:  None. FINDINGS: Intrauterine gestational sac: Fluid is noted within the endometrial space, but a defined rounded fluid collection is not clearly visualized. Yolk sac:  Not Visualized. Embryo:  Not Visualized. Cardiac Activity: Not Visualized. MSD: 10.7 mm   5 w   6 d  Subchorionic hemorrhage:  None visualized. Maternal uterus/adnexae: Right ovary is unremarkable. Small amount of free fluid is noted which may be physiologic. Two complex cysts are noted within or adjacent the left ovary. One measures 2.8 cm in diameter and demonstrates irregular internal echoes as well as some peripheral blood flow on Doppler; it appears to be  within the ovary. Another measures 1.8 cm and demonstrates rounded smaller cyst within it concerning for yolk sac, and this is concerning for an ectopic pregnancy that may be immediately adjacent to the left ovary. IMPRESSION: Findings are highly concerning for ectopic pregnancy adjacent to the left ovary. Critical Value/emergent results were called by telephone at the time of interpretation on 01/05/2020 at 1:30 pm to provider Dr. Stevie Kern, who verbally acknowledged these results. Electronically Signed   By: Lupita Raider M.D.   On: 01/05/2020 13:31   US OB Transvaginal  Result Date: 01/05/2020 CLINICAL DATA:  Abdominal pain, first trimester of pregnancy. EXAM: OBSTETRIC <14 WK [place] AND TRANSVAGINAL OB US TECHNIQUE: Both transabdominal and transvaginal ultrasound examinations were performed for complete evaluation of the gestation as well as the maternal uterus, adnexal regions, and pelvic cul-de-sac. Transvaginal technique was performed to assess early pregnancy. COMPARISON:  None. FINDINGS: Intrauterine gestational sac: Fluid is noted within the endometrial space, but a defined rounded fluid collection is not clearly visualized. Yolk sac:  Not Visualized. Embryo:  Not Visualized. Cardiac Activity: Not Visualized. MSD: 10.7 mm   5 w   6 d Subchorionic hemorrhage:  None visualized. Maternal uterus/adnexae: Right ovary is unremarkable. Small amount of free fluid is noted which may be physiologic. Two complex cysts are noted within or adjacent the left ovary. One measures 2.8 cm in diameter and demonstrates irregular internal echoes as well as some  peripheral blood flow on Doppler; it appears to be within the ovary. Another measures 1.8 cm and demonstrates rounded smaller cyst within it concerning for yolk sac, and this is concerning for an ectopic pregnancy that may be immediately adjacent to the left ovary. IMPRESSION: Findings are highly concerning for ectopic pregnancy adjacent to the left ovary. Critical Value/emergent results were called by telephone at the time of interpretation on 01/05/2020 at 1:30 pm to provider Dr. Stevie Kern, who verbally acknowledged these results. Electronically Signed   By: Lupita Raider M.D.   On: 01/05/2020 13:31   Procedures Procedures (including critical care time)  Medications Ordered in ED Medications  methotrexate chemo injection 92.5 mg (has no administration in time range)    ED Course  I have reviewed the triage vital signs and the nursing notes.  Pertinent labs & imaging results that were available during my care of the patient were reviewed by me and considered in my medical decision making (see chart for details).  Adult female with home pregnancy test positive presents with abdominal pain Initial consideration of ectopic labs, ultrasound.  I discussed the patient's ultrasound results with our OB/GYN colleagues. With no evidence for rupture, the patient is appropriate for methotrexate injection.  I discussed this with her nursing staff, then with pharmacy.  Patient and her female companion are aware of today's findings.  3:33 PM Patient has received intramuscular methotrexate. I discussed this with our pharmacy colleagues as well.  Adult female, G3, P1, at about 5 weeks presents with abdominal pain. Patient found to have ectopic pregnancy, with embryo adjacent to left ovary. I discussed her case with our OB colleagues, the patient received methotrexate in the emergency department, and was discharged to follow-up in 3 days for a second dose.  Final Clinical Impression(s) / ED  Diagnoses Final diagnoses:  Left tubal pregnancy without intrauterine pregnancy   CRITICAL CARE Performed by: Gerhard Munch Total critical care time: 35 minutes Critical care time was exclusive of separately billable procedures and treating other patients. Critical care was necessary to  treat or prevent imminent or life-threatening deterioration. Critical care was time spent personally by me on the following activities: development of treatment plan with patient and/or surrogate as well as nursing, discussions with consultants, evaluation of patient's response to treatment, examination of patient, obtaining history from patient or surrogate, ordering and performing treatments and interventions, ordering and review of laboratory studies, ordering and review of radiographic studies, pulse oximetry and re-evaluation of patient's condition.    Gerhard Munch, MD 01/05/20 1537

## 2020-01-05 NOTE — ED Triage Notes (Signed)
Patient reports she is having abdominal pain and cramping. Patient reports she is pregnant, LMP August 27th. Patient denies vaginal bleeding or N/V/D.

## 2020-01-07 ENCOUNTER — Other Ambulatory Visit: Payer: 59

## 2020-01-08 ENCOUNTER — Other Ambulatory Visit: Payer: Self-pay

## 2020-01-08 ENCOUNTER — Telehealth: Payer: Self-pay

## 2020-01-08 ENCOUNTER — Inpatient Hospital Stay: Admission: RE | Admit: 2020-01-08 | Payer: 59 | Source: Ambulatory Visit

## 2020-01-08 ENCOUNTER — Other Ambulatory Visit: Payer: 59

## 2020-01-08 ENCOUNTER — Ambulatory Visit: Payer: 59

## 2020-01-08 DIAGNOSIS — O200 Threatened abortion: Secondary | ICD-10-CM

## 2020-01-08 DIAGNOSIS — O26899 Other specified pregnancy related conditions, unspecified trimester: Secondary | ICD-10-CM

## 2020-01-08 LAB — BETA HCG QUANT (REF LAB): hCG Quant: 10324 m[IU]/mL

## 2020-01-08 NOTE — Telephone Encounter (Signed)
S/w pt and advised of results and provider recommendations to have stat U/S today. Pt states that she cannot make U/S appt due to childcare issues, pt will go to [facility] later tonight.

## 2020-01-08 NOTE — Progress Notes (Signed)
Pt is in the office for stat hcg, pt reports pelvic cramps rating a 7/10, denies any bleeding. Pt is taking Tylenol for the pain.

## 2020-01-09 ENCOUNTER — Other Ambulatory Visit: Payer: Self-pay

## 2020-01-09 ENCOUNTER — Inpatient Hospital Stay (HOSPITAL_COMMUNITY)
Admission: AD | Admit: 2020-01-09 | Discharge: 2020-01-09 | Disposition: A | Discharge status: Home / Self Care | Payer: 59 | Attending: Obstetrics and Gynecology | Admitting: Obstetrics and Gynecology

## 2020-01-09 ENCOUNTER — Inpatient Hospital Stay (HOSPITAL_COMMUNITY): Payer: 59

## 2020-01-09 ENCOUNTER — Encounter (HOSPITAL_COMMUNITY): Payer: Self-pay | Admitting: Obstetrics and Gynecology

## 2020-01-09 ENCOUNTER — Telehealth: Payer: Self-pay

## 2020-01-09 DIAGNOSIS — R109 Unspecified abdominal pain: Secondary | ICD-10-CM | POA: Insufficient documentation

## 2020-01-09 DIAGNOSIS — R110 Nausea: Secondary | ICD-10-CM | POA: Insufficient documentation

## 2020-01-09 DIAGNOSIS — O26891 Other specified pregnancy related conditions, first trimester: Secondary | ICD-10-CM | POA: Diagnosis not present

## 2020-01-09 DIAGNOSIS — Z3A01 Less than 8 weeks gestation of pregnancy: Secondary | ICD-10-CM | POA: Diagnosis not present

## 2020-01-09 DIAGNOSIS — O09292 Supervision of pregnancy with other poor reproductive or obstetric history, second trimester: Secondary | ICD-10-CM | POA: Diagnosis not present

## 2020-01-09 DIAGNOSIS — O00102 Left tubal pregnancy without intrauterine pregnancy: Secondary | ICD-10-CM | POA: Diagnosis not present

## 2020-01-09 DIAGNOSIS — Z8759 Personal history of other complications of pregnancy, childbirth and the puerperium: Secondary | ICD-10-CM | POA: Diagnosis not present

## 2020-01-09 DIAGNOSIS — Z79899 Other long term (current) drug therapy: Secondary | ICD-10-CM | POA: Insufficient documentation

## 2020-01-09 LAB — HCG, QUANTITATIVE, PREGNANCY: hCG, Beta Chain, Quant, S: 14265 m[IU]/mL — ABNORMAL HIGH (ref <5)

## 2020-01-09 MED ORDER — PROMETHAZINE HCL 12.5 MG PO TABS: 12.5000 mg | ORAL_TABLET | Freq: Every evening | ORAL | 0 refills | Status: DC | PRN

## 2020-01-09 MED ORDER — TRAMADOL HCL 50 MG PO TABS: 50.0000 mg | ORAL_TABLET | Freq: Four times a day (QID) | ORAL | 0 refills | Status: DC | PRN

## 2020-01-09 MED ORDER — METHOTREXATE FOR ECTOPIC PREGNANCY
50.0000 mg/m2 | Freq: Once | INTRAMUSCULAR | Status: AC
  Administered 2020-01-09: 95 mg via INTRAMUSCULAR
  Filled 2020-01-09: qty 1

## 2020-01-09 MED ORDER — METOCLOPRAMIDE HCL 10 MG PO TABS: 10.0000 mg | ORAL_TABLET | Freq: Three times a day (TID) | ORAL | 0 refills | Status: DC | PRN

## 2020-01-09 NOTE — Telephone Encounter (Signed)
Called pt to follow up on yesterday's phone call and see why she did not go to [facility]. Pt reports she did not have any child care for young child and that she is currently on her way to drop child off at daycare and go to [facility]. Pt reports she is still having pain but not as much as yesterday. I reiterated the importance of pt going to [facility] and pt voices understanding.

## 2020-01-09 NOTE — Discharge Instructions (Signed)
° °Methotrexate Treatment for an Ectopic Pregnancy ° °Methotrexate is a medicine that treats an ectopic pregnancy. An ectopic pregnancy is a pregnancy in which the fetus develops outside the uterus. This kind of pregnancy can be dangerous. Methotrexate works by stopping the growth of the fertilized egg. It also helps your body absorb tissue from the egg. This takes between 2-6 weeks. Most ectopic pregnancies can be successfully treated with methotrexate if they are diagnosed early. °Tell a health care provider about: °· Any allergies you have. °· All medicines you are taking, including vitamins, herbs, eye drops, creams, and over-the-counter medicines. °· Any medical conditions you have. °What are the risks? °Generally, this is a safe treatment. However, problems may occur, including: °· Nausea or vomiting or both. °· Vaginal bleeding or spotting. °· Diarrhea. °· Abdominal cramping. °· Dizziness or feeling lightheaded. °· Mouth sores. °· Swelling or irritation of the lining of your lungs (pneumonitis). °· Liver damage. °· Hair loss. °There is a risk that methotrexate treatment will fail and your pregnancy will continue. There is also a risk that the ectopic pregnancy might rupture while you are using this medicine. °What happens before the procedure? °· Liver tests, kidney tests, and a complete blood test will be done. °· Blood tests will be done to measure the pregnancy hormone levels and to determine your blood type. °· If you are Rh-negative and the father is Rh-positive or his Rh type is not known, you will be given a Rho (D) immune globulin shot. °What happens during the procedure? °Your health care provider may give you methotrexate by injection or in the form of a pill. Methotrexate may be given as a single dose of medicine or a series of doses, depending on your response to the treatment. °· Methotrexate injections will be given by your health care provider. This is the most common way that methotrexate is  used to treat an ectopic pregnancy. °· If you are prescribed oral methotrexate, it is very important that you follow your health care provider's instructions on how to take oral methotrexate. °Additional medicines may be needed to manage an ectopic pregnancy. °The procedure may vary among health care providers and hospitals. °What happens after the procedure? °· You may have abdominal cramping, vaginal bleeding, and fatigue. °· Blood tests will be taken at timed intervals for several days or weeks to check your pregnancy hormone levels. The blood tests will be done until the pregnancy hormone can no longer be detected in the blood. °· You may need to have a surgical procedure to remove the ectopic pregnancy if methotrexate treatment fails. °· Follow instructions from your health care provider on how and when to report any symptoms that may indicate a ruptured ectopic pregnancy. °Summary °· Methotrexate is a medicine that treats an ectopic pregnancy. °· Methotrexate may be given in a single dose or a series of doses over time. °· Blood tests will be taken at timed intervals for several days or weeks to check your pregnancy hormone levels. The blood tests will be done until no more pregnancy hormone is detected in the blood. °· There is a risk that methotrexate treatment will fail and your pregnancy will continue. There is also a risk that the ectopic pregnancy might rupture while you are using this medicine. °This information is not intended to replace advice given to you by your health care provider. Make sure you discuss any questions you have with your health care provider. °Document Revised: 03/02/2017 Document Reviewed: 05/09/2016 °Elsevier   Education  [employer].   Methotrexate Treatment for an Ectopic Pregnancy, Care After This sheet gives you information about how to care for yourself after your procedure. Your health care provider may also give you more specific instructions. If you have  problems or questions, contact your health care provider. What can I expect after the procedure? After the procedure, it is common to have:  Abdominal cramping.  Vaginal bleeding.  Fatigue.  Nausea.  Vomiting.  Diarrhea. Blood tests will be taken at timed intervals for several days or weeks to check your pregnancy hormone levels. The blood tests will be done until the pregnancy hormone can no longer be detected in the blood. Follow these instructions at home: Activity  Do not have sex until your health care provider approves.  Limit activities that take a lot of effort as told by your health care provider. Medicines  Take over the counter and prescription medicines only as told by your health care provider.  Do not take aspirin, ibuprofen, naproxen, or any other NSAIDs.  Do not take folic acid, prenatal vitamins, or other vitamins that contain folic acid. General instructions   Do not drink alcohol.  Follow instructions from your health care provider on how and when to report any symptoms that may indicate a ruptured ectopic pregnancy.  Keep all follow-up visits as told by your health care provider. This is important. Contact a health care provider if:  You have persistent nausea and vomiting.  You have persistent diarrhea.  You are having a reaction to the medicine, such as: ? Tiredness. ? Skin rash. ? Hair loss. Get help right away if:  Your abdominal or pelvic pain gets worse.  You have more vaginal bleeding.  You feel light-headed or you faint.  You have shortness of breath.  Your heart rate increases.  You develop a cough.  You have chills.  You have a fever. Summary  After the procedure, it is common to have symptoms of abdominal cramping, vaginal bleeding and fatigue. You may also experience other symptoms.  Blood tests will be taken at timed intervals for several days or weeks to check your pregnancy hormone levels. The blood tests will be  done until the pregnancy hormone can no longer be detected in the blood.  Limit strenuous activity as told by your health care provider.  Follow instructions from your health care provider on how and when to report any symptoms that may indicate a ruptured ectopic pregnancy. This information is not intended to replace advice given to you by your health care provider. Make sure you discuss any questions you have with your health care provider. Document Revised: 03/02/2017 Document Reviewed: 05/09/2016 Elsevier Patient Education  2020 [employer].

## 2020-01-09 NOTE — [facility] Note (Signed)
Valerie Taylor is a 34 y.o. at [redacted]w[redacted]d here in [facility] reporting: got MTX on Monday and is here for follow up labs. Having some intermittent abdominal pain. No bleeding.  Onset of complaint: ongoing  Pain score: 5/10  Vitals:   01/09/20 1250  BP: (!) 108/50  Pulse: 78  Resp: 16  Temp: 98.6 F (37 C)  SpO2: 97%     Lab orders placed from triage: entered by provider

## 2020-01-09 NOTE — MAU Provider Note (Signed)
Chief Complaint: Follow-up   None     SUBJECTIVE HPI: Valerie Taylor is a 34 y.o. [id number] on Day 5 following methotrexate for left ectopic who presents to maternity admissions reporting increased pain in the last 2 days.  Her pain was worse yesterday, 7/10, but is now improved, 5/10. She reports nausea since the methotrexate was given. There are no other associated symptoms.  She had labs yesterday at [facility] and hcg rose from 7,484 on 10/4 when methotrexate was given to 10, 324 on Day 4.  She is taking Tylenol for pain.   She denies vaginal bleeding, vaginal itching/burning, urinary symptoms, h/a, dizziness, or fever/chills.     Location: low abdomen Quality: cramping Severity: 5/10 on pain scale Duration: 2 days Timing: intermittent Modifying factors: Tylenol does not help Associated signs and symptoms: none  HPI  Past Medical History:  Diagnosis Date  . Headache   . History of ectopic pregnancy 12/06/2017  . History of pregnancy loss in prior pregnancy, currently pregnant 12/06/2017   4 months loss- patient unclear of events Records requested  . Infection    UTI  . Kidney stones    Past Surgical History:  Procedure Laterality Date  . NO PAST SURGERIES     Social History   Socioeconomic History  . Marital status: Single    Spouse name: Not on file  . Number of children: Not on file  . Years of education: Not on file  . Highest education level: Not on file  Occupational History  . Not on file  Tobacco Use  . Smoking status: Never Smoker  . Smokeless tobacco: Never Used  Vaping Use  . Vaping Use: Never used  Substance and Sexual Activity  . Alcohol use: Never  . Drug use: Never  . Sexual activity: Yes    Partners: Male    Birth control/protection: None  Other Topics Concern  . Not on file  Social History Narrative  . Not on file   Social Determinants of Health   Financial Resource Strain:   . Difficulty of Paying Living Expenses: Not on file  Food  Insecurity:   . Worried About Charity fundraiser in the Last Year: Not on file  . Ran Out of Food in the Last Year: Not on file  Transportation Needs:   . Lack of Transportation (Medical): Not on file  . Lack of Transportation (Non-Medical): Not on file  Physical Activity:   . Days of Exercise per Week: Not on file  . Minutes of Exercise per Session: Not on file  Stress:   . Feeling of Stress : Not on file  Social Connections:   . Frequency of Communication with Friends and Family: Not on file  . Frequency of Social Gatherings with Friends and Family: Not on file  . Attends Religious Services: Not on file  . Active Member of Clubs or Organizations: Not on file  . Attends Archivist Meetings: Not on file  . Marital Status: Not on file  Intimate Partner Violence:   . Fear of Current or Ex-Partner: Not on file  . Emotionally Abused: Not on file  . Physically Abused: Not on file  . Sexually Abused: Not on file   No current facility-administered medications on file prior to encounter.   Current Outpatient Medications on File Prior to Encounter  Medication Sig Dispense Refill  . acetaminophen (TYLENOL) 500 MG tablet Take 500 mg by mouth every 6 (six) hours as needed for headache.    Marland Kitchen  ferrous sulfate 324 (65 Fe) MG TBEC Take 1 tablet (325 mg total) by mouth daily. (Patient not taking: Reported on 07/26/2018) 30 tablet 3  . fluconazole (DIFLUCAN) 150 MG tablet Take 1 tablet (150 mg total) by mouth every three (3) days as needed. (Patient not taking: Reported on 01/08/2020) 3 tablet 0  . ibuprofen (ADVIL,MOTRIN) 600 MG tablet Take 1 tablet (600 mg total) by mouth every 6 (six) hours. (Patient not taking: Reported on 07/26/2018) 30 tablet 0  . lactobacillus acidophilus & bulgar (LACTINEX) chewable tablet Chew 1 tablet by mouth 3 (three) times daily with meals. 180 tablet 3  . metroNIDAZOLE (FLAGYL) 500 MG tablet Take 1 tablet (500 mg total) by mouth 2 (two) times daily. (Patient not  taking: Reported on 07/26/2018) 14 tablet 0  . Prenatal Vit-Fe Fumarate-FA (PRENATAL MULTIVITAMIN) TABS tablet Take 1 tablet by mouth daily at 12 noon. (Patient not taking: Reported on 01/08/2020)     No Known Allergies  ROS:  Review of Systems  Constitutional: Negative for chills, fatigue and fever.  Respiratory: Negative for shortness of breath.   Cardiovascular: Negative for chest pain.  Gastrointestinal: Positive for abdominal pain.  Genitourinary: Negative for difficulty urinating, dysuria, flank pain, pelvic pain, vaginal bleeding, vaginal discharge and vaginal pain.  Neurological: Negative for dizziness and headaches.  Psychiatric/Behavioral: Negative.      I have reviewed patient's Past Medical Hx, Surgical Hx, Family Hx, Social Hx, medications and allergies.   Physical Exam   Patient Vitals for the past 24 hrs:  BP Temp Temp src Pulse Resp SpO2 Height Weight  01/09/20 1250 (!) 108/50 98.6 F (37 C) Oral 78 16 97 % -- --  01/09/20 1247 -- -- -- -- -- -- _0  (1.626 m) 77.2 kg   Constitutional: Well-developed, well-nourished female in no acute distress.  Cardiovascular: normal rate Respiratory: normal effort GI: Abd soft, non-tender. Pos BS x 4 MS: Extremities nontender, no edema, normal ROM Neurologic: Alert and oriented x 4.  GU: Neg CVAT.  PELVIC EXAM: Deferred    LAB RESULTS Results for orders placed or performed during the hospital encounter of 01/09/20 (from the past 24 hour(s))  hCG, quantitative, pregnancy     Status: Abnormal   Collection Time: 01/09/20 12:32 PM  Result Value Ref Range   hCG, Beta Chain, Quant, S 14,265 (H) <5 mIU/mL       IMAGING US OB Comp < 14 Wks  Result Date: 01/05/2020 CLINICAL DATA:  Abdominal pain, first trimester of pregnancy. EXAM: OBSTETRIC <14 WK [place] AND TRANSVAGINAL OB US TECHNIQUE: Both transabdominal and transvaginal ultrasound examinations were performed for complete evaluation of the gestation as well as the maternal  uterus, adnexal regions, and pelvic cul-de-sac. Transvaginal technique was performed to assess early pregnancy. COMPARISON:  None. FINDINGS: Intrauterine gestational sac: Fluid is noted within the endometrial space, but a defined rounded fluid collection is not clearly visualized. Yolk sac:  Not Visualized. Embryo:  Not Visualized. Cardiac Activity: Not Visualized. MSD: 10.7 mm   5 w   6 d Subchorionic hemorrhage:  None visualized. Maternal uterus/adnexae: Right ovary is unremarkable. Small amount of free fluid is noted which may be physiologic. Two complex cysts are noted within or adjacent the left ovary. One measures 2.8 cm in diameter and demonstrates irregular internal echoes as well as some peripheral blood flow on Doppler; it appears to be within the ovary. Another measures 1.8 cm and demonstrates rounded smaller cyst within it concerning for yolk sac, and this is  concerning for an ectopic pregnancy that may be immediately adjacent to the left ovary. IMPRESSION: Findings are highly concerning for ectopic pregnancy adjacent to the left ovary. Critical Value/emergent results were called by telephone at the time of interpretation on 01/05/2020 at 1:30 pm to provider Dr. Roslynn Amble, who verbally acknowledged these results. Electronically Signed   By: Marijo Conception M.D.   On: 01/05/2020 13:31   US OB Transvaginal  Result Date: 01/09/2020 CLINICAL DATA:  Ectopic pregnancy recently treated with methotrexate. Persistent pelvic pain. EXAM: TRANSVAGINAL OB ULTRASOUND TECHNIQUE: Transvaginal ultrasound was performed for complete evaluation of the gestation as well as the maternal uterus, adnexal regions, and pelvic cul-de-sac. COMPARISON:  01/05/2020 FINDINGS: Intrauterine gestational sac: None Maternal uterus/adnexae: The right ovary is normal in appearance. The left ovary contains a small corpus luteum. There is a persistent lesion in the left adnexa adjacent to the ovary which has the appearance of a gestational  sac with internal yolk sac. This measures 2.2 by 1.6 x 1.7 cm, without significant change in size or appearance since previous study. No evidence of hemoperitoneum. IMPRESSION: Persistent 2.2 cm left adnexal mass shows no significant change since recent exam, and remains highly suspicious for ectopic pregnancy. No evidence of hemoperitoneum. Electronically Signed   By: Marlaine Hind M.D.   On: 01/09/2020 14:10   US OB Transvaginal  Result Date: 01/05/2020 CLINICAL DATA:  Abdominal pain, first trimester of pregnancy. EXAM: OBSTETRIC <14 WK [place] AND TRANSVAGINAL OB US TECHNIQUE: Both transabdominal and transvaginal ultrasound examinations were performed for complete evaluation of the gestation as well as the maternal uterus, adnexal regions, and pelvic cul-de-sac. Transvaginal technique was performed to assess early pregnancy. COMPARISON:  None. FINDINGS: Intrauterine gestational sac: Fluid is noted within the endometrial space, but a defined rounded fluid collection is not clearly visualized. Yolk sac:  Not Visualized. Embryo:  Not Visualized. Cardiac Activity: Not Visualized. MSD: 10.7 mm   5 w   6 d Subchorionic hemorrhage:  None visualized. Maternal uterus/adnexae: Right ovary is unremarkable. Small amount of free fluid is noted which may be physiologic. Two complex cysts are noted within or adjacent the left ovary. One measures 2.8 cm in diameter and demonstrates irregular internal echoes as well as some peripheral blood flow on Doppler; it appears to be within the ovary. Another measures 1.8 cm and demonstrates rounded smaller cyst within it concerning for yolk sac, and this is concerning for an ectopic pregnancy that may be immediately adjacent to the left ovary. IMPRESSION: Findings are highly concerning for ectopic pregnancy adjacent to the left ovary. Critical Value/emergent results were called by telephone at the time of interpretation on 01/05/2020 at 1:30 pm to provider Dr. Roslynn Amble, who verbally  acknowledged these results. Electronically Signed   By: Marijo Conception M.D.   On: 01/05/2020 13:31    MAU Management/MDM: Orders Placed This Encounter  Procedures  . US OB Transvaginal  . hCG, quantitative, pregnancy  . Discharge patient    Meds ordered this encounter  Medications  . promethazine (PHENERGAN) 12.5 MG tablet    Sig: Take 1-2 tablets (12.5-25 mg total) by mouth at bedtime as needed for nausea or vomiting.    Dispense:  30 tablet    Refill:  0    Order Specific Question:   Supervising Provider    Answer:   Lynnda Shields A [[id number]]  . metoCLOPramide (REGLAN) 10 MG tablet    Sig: Take 1 tablet (10 mg total) by mouth 3 (three) times daily  with meals as needed for nausea.    Dispense:  30 tablet    Refill:  0    Order Specific Question:   Supervising Provider    Answer:   Lynnda Shields A [[id number]]  . methotrexate (EMGYN) chemo injection kit 95 mg  . traMADol (ULTRAM) 50 MG tablet    Sig: Take 1 tablet (50 mg total) by mouth every 6 (six) hours as needed.    Dispense:  15 tablet    Refill:  0    Order Specific Question:   Supervising Provider    Answer:   KATOYA, AMATO A [[id number]]    Pt sent to MAU for stat US for increase in hcg on Day 4 post MTX and due to abdominal pain. Pt pain is improved today but is still present. Hcg drawn today and continues to rise.  Consult Dr Elgie Congo with assessment and findings. Give second dose of MTX today and begin follow up with Day 4 and Day 7 labs scheduled at [facility]. Ectopic precautions reviewed. MTX given by RN, pt tolerated well and was discharged with strict return precautions.  Rx for antiemetics and Tramadol for pain.  Pt should not take ibuprofen or PNV during MTX therapy.   ASSESSMENT 1. Left tubal pregnancy without intrauterine pregnancy     PLAN Discharge home with ectopic precautions  Allergies as of 01/09/2020   No Known Allergies     Medication List    STOP taking these medications   ferrous sulfate 324 (65 Fe)  MG Tbec   fluconazole 150 MG tablet Commonly known as: DIFLUCAN   ibuprofen 600 MG tablet Commonly known as: ADVIL   metroNIDAZOLE 500 MG tablet Commonly known as: FLAGYL   prenatal multivitamin Tabs tablet     TAKE these medications   acetaminophen 500 MG tablet Commonly known as: TYLENOL Take 500 mg by mouth every 6 (six) hours as needed for headache.   lactobacillus acidophilus & bulgar chewable tablet Chew 1 tablet by mouth 3 (three) times daily with meals.   metoCLOPramide 10 MG tablet Commonly known as: REGLAN Take 1 tablet (10 mg total) by mouth 3 (three) times daily with meals as needed for nausea.   promethazine 12.5 MG tablet Commonly known as: PHENERGAN Take 1-2 tablets (12.5-25 mg total) by mouth at bedtime as needed for nausea or vomiting.   traMADol 50 MG tablet Commonly known as: ULTRAM Take 1 tablet (50 mg total) by mouth every 6 (six) hours as needed.        Fatima Blank Certified Nurse-Midwife 01/09/2020  3:09 PM

## 2020-01-10 NOTE — Progress Notes (Signed)
Patient was assessed and managed by nursing staff during this encounter. I have reviewed the chart and agree with the documentation and plan. I have also made any necessary editorial changes.  Patient with increased pain and rising HCG in the setting of an ectopic pregnancy treated with methotrexate. Stat ultrasound ordered or if patient unable to have ultrasound today, MAU evaluation to rule out ruptured ectopic  Catalina Antigua, MD 01/10/2020 12:12 PM

## 2020-01-12 ENCOUNTER — Observation Stay (HOSPITAL_COMMUNITY)
Admission: AD | Admit: 2020-01-12 | Discharge: 2020-01-13 | Disposition: A | Discharge status: Home / Self Care | Payer: 59 | Attending: Obstetrics & Gynecology | Admitting: Obstetrics & Gynecology

## 2020-01-12 ENCOUNTER — Other Ambulatory Visit: Payer: Self-pay

## 2020-01-12 ENCOUNTER — Other Ambulatory Visit (INDEPENDENT_AMBULATORY_CARE_PROVIDER_SITE_OTHER): Payer: 59

## 2020-01-12 ENCOUNTER — Inpatient Hospital Stay (HOSPITAL_COMMUNITY): Payer: 59

## 2020-01-12 ENCOUNTER — Encounter (HOSPITAL_COMMUNITY): Payer: Self-pay | Admitting: Obstetrics & Gynecology

## 2020-01-12 VITALS — BP 105/68 | HR 84 | Wt 167.0 lb

## 2020-01-12 DIAGNOSIS — O00102 Left tubal pregnancy without intrauterine pregnancy: Principal | ICD-10-CM | POA: Insufficient documentation

## 2020-01-12 DIAGNOSIS — R109 Unspecified abdominal pain: Secondary | ICD-10-CM

## 2020-01-12 DIAGNOSIS — O26899 Other specified pregnancy related conditions, unspecified trimester: Secondary | ICD-10-CM

## 2020-01-12 DIAGNOSIS — Z3A01 Less than 8 weeks gestation of pregnancy: Secondary | ICD-10-CM | POA: Insufficient documentation

## 2020-01-12 DIAGNOSIS — O009 Unspecified ectopic pregnancy without intrauterine pregnancy: Secondary | ICD-10-CM | POA: Diagnosis present

## 2020-01-12 DIAGNOSIS — Z20822 Contact with and (suspected) exposure to covid-19: Secondary | ICD-10-CM | POA: Insufficient documentation

## 2020-01-12 DIAGNOSIS — O00109 Unspecified tubal pregnancy without intrauterine pregnancy: Secondary | ICD-10-CM

## 2020-01-12 LAB — CBC
HCT: 32.1 % — ABNORMAL LOW (ref 36.0–46.0)
Hemoglobin: 10.5 g/dL — ABNORMAL LOW (ref 12.0–15.0)
MCH: 30.3 pg (ref 26.0–34.0)
MCHC: 32.7 g/dL (ref 30.0–36.0)
MCV: 92.8 fL (ref 80.0–100.0)
Platelets: 288 10*3/uL (ref 150–400)
RBC: 3.46 MIL/uL — ABNORMAL LOW (ref 3.87–5.11)
RDW: 13 % (ref 11.5–15.5)
WBC: 9.5 10*3/uL (ref 4.0–10.5)
nRBC: 0 % (ref 0.0–0.2)

## 2020-01-12 LAB — HCG, QUANTITATIVE, PREGNANCY: hCG, Beta Chain, Quant, S: 11906 m[IU]/mL — ABNORMAL HIGH (ref <5)

## 2020-01-12 MED ORDER — LACTATED RINGERS IV BOLUS
1000.0000 mL | Freq: Once | INTRAVENOUS | Status: AC
  Administered 2020-01-12: 1000 mL via INTRAVENOUS

## 2020-01-12 MED ORDER — ZOLPIDEM TARTRATE 5 MG PO TABS: 5.0000 mg | ORAL_TABLET | Freq: Every evening | ORAL | Status: DC | PRN

## 2020-01-12 MED ORDER — HYDROMORPHONE HCL 1 MG/ML IJ SOLN: 1.0000 mg | Freq: Once | INTRAMUSCULAR | Status: DC

## 2020-01-12 MED ORDER — HYDROMORPHONE HCL 1 MG/ML IJ SOLN
1.0000 mg | INTRAMUSCULAR | Status: DC | PRN
  Administered 2020-01-13 (×2): 1 mg via INTRAVENOUS
  Filled 2020-01-12 (×2): qty 1

## 2020-01-12 MED ORDER — PRENATAL MULTIVITAMIN CH: 1.0000 | ORAL_TABLET | Freq: Every day | ORAL | Status: DC

## 2020-01-12 MED ORDER — PROMETHAZINE HCL 25 MG/ML IJ SOLN
25.0000 mg | Freq: Once | INTRAMUSCULAR | Status: AC
  Administered 2020-01-13: 25 mg via INTRAVENOUS
  Filled 2020-01-12: qty 1

## 2020-01-12 NOTE — Progress Notes (Signed)
SUBJECTIVE 34 y.o presents for STAT HCG, c/o pain 7/10.  Denies bleeding, fever or chills.   PLAN STAT HCG drawn and sent to lab.  Patient will be called with results.  Patient did not want to return to MAU although her pain is worse. Ectopic precautions reviewed and reinforced by Dr. Jolayne Panther.

## 2020-01-12 NOTE — Progress Notes (Addendum)
Patient was assessed and managed by nursing staff during this encounter. I have reviewed the chart and agree with the documentation and plan. I have also made any necessary editorial changes.   HCG dropped less than 15% (9820) in comparison to 10/7 10324. Patient received a second dose to methotrexate on 10/8. Will have patient return on 10/14 for repeat quant HCG  Sherissa Tenenbaum, MD 01/12/2020 2:02 PM

## 2020-01-12 NOTE — MAU Provider Note (Signed)
History     CSN: [id number]  Arrival date and time: 01/12/20 6720   First Provider Initiated Contact with Patient 01/12/20 2030      Chief Complaint  Patient presents with  . Ectopic Pregnancy   HPI  OB History    Gravida  4   Para  1   Term  1   Preterm      AB  2   Living  1     SAB  1   TAB      Ectopic  1   Multiple  0   Live Births  1        Obstetric Comments  2004 per pt         Past Medical History:  Diagnosis Date  . Headache   . History of ectopic pregnancy 12/06/2017  . History of pregnancy loss in prior pregnancy, currently pregnant 12/06/2017   4 months loss- patient unclear of events Records requested  . Infection    UTI  . Kidney stones     Past Surgical History:  Procedure Laterality Date  . NO PAST SURGERIES      Family History  Problem Relation Age of Onset  . Healthy Mother   . Healthy Father   . Asthma Sister   . Cancer Maternal Aunt        breast    Social History   Tobacco Use  . Smoking status: Never Smoker  . Smokeless tobacco: Never Used  Vaping Use  . Vaping Use: Never used  Substance Use Topics  . Alcohol use: Never  . Drug use: Never    Allergies: No Known Allergies  Medications Prior to Admission  Medication Sig Dispense Refill Last Dose  . acetaminophen (TYLENOL) 500 MG tablet Take 500 mg by mouth every 6 (six) hours as needed for headache.   01/12/2020 at Unknown time  . lactobacillus acidophilus & bulgar (LACTINEX) chewable tablet Chew 1 tablet by mouth 3 (three) times daily with meals. 180 tablet 3   . metoCLOPramide (REGLAN) 10 MG tablet Take 1 tablet (10 mg total) by mouth 3 (three) times daily with meals as needed for nausea. 30 tablet 0   . promethazine (PHENERGAN) 12.5 MG tablet Take 1-2 tablets (12.5-25 mg total) by mouth at bedtime as needed for nausea or vomiting. 30 tablet 0   . traMADol (ULTRAM) 50 MG tablet Take 1 tablet (50 mg total) by mouth every 6 (six) hours as needed. 15 tablet  0     Review of Systems  Constitutional: Negative.   HENT: Negative.   Eyes: Negative.   Respiratory: Negative.   Cardiovascular: Negative.   Gastrointestinal: Negative.   Endocrine: Negative.   Genitourinary: Positive for pelvic pain (increased LT side pain, worse than 3 days ago when in MAU; rated 10/10 ).  Musculoskeletal: Negative.   Skin: Negative.   Allergic/Immunologic: Negative.   Neurological: Negative.   Hematological: Negative.   Psychiatric/Behavioral: Negative.    Physical Exam   Blood pressure 128/67, pulse 85, temperature 98.3 F (36.8 C), temperature source Oral, resp. rate 17, last menstrual period 11/28/2019, SpO2 100 %, currently breastfeeding.  Physical Exam Vitals and nursing note reviewed.  Constitutional:      Appearance: Normal appearance. She is normal weight.  HENT:     Head: Normocephalic and atraumatic.     Nose: Nose normal.  Eyes:     Pupils: Pupils are equal, round, and reactive to light.  Cardiovascular:  Rate and Rhythm: Normal rate.     Pulses: Normal pulses.  Pulmonary:     Effort: Pulmonary effort is normal.  Musculoskeletal:     Cervical back: Normal range of motion.  Neurological:     Mental Status: She is alert.     MAU Course  Procedures  MDM HCG OB < 14 wks U/S Prior HCG levels: 10/4 = 7,484 (Day 0/1 MTX); 10/7 = 10,324(Day 4); 10/8 = 14,265 (Day 5, Day 0/1 MTX #2); 10/11 = 11,906 (Day 4) Prep for OR LR 1000 ml @ 125 ml/hr NPO Dilaudid 1 mg every 2 hours IVP Phenergan 25 mg IVP x 1  Results for orders placed or performed during the hospital encounter of 01/12/20 (from the past 24 hour(s))  hCG, quantitative, pregnancy     Status: Abnormal   Collection Time: 01/12/20  8:57 PM  Result Value Ref Range   hCG, Beta Chain, Quant, S 11,906 (H) <5 mIU/mL     US OB Transvaginal  Result Date: 01/12/2020 CLINICAL DATA:  Abdominal pain, pregnancy of unknown location EXAM: TRANSVAGINAL OB ULTRASOUND TECHNIQUE:  Transvaginal ultrasound was performed for complete evaluation of the gestation as well as the maternal uterus, adnexal regions, and pelvic cul-de-sac. COMPARISON:  01/09/2020, 01/05/2020 FINDINGS: Intrauterine gestational sac: None identified Maternal uterus/adnexae: The uterus is anteverted. No uterine masses are identified. The endometrium is thickened, similar to that noted on prior examination, however, there is developing hypoechoic debris within the endometrial cavity likely representing blood product. As noted above, no intrauterine gestational sac is identified. The cervix is closed. The right ovary is normal in size and echogenicity and demonstrates normal color flow vascularity. The left ovary measures roughly 1.6 x 2.8 cm, best seen on image # 30. There is an adjacent echogenic soft tissue mass, similar to that seen on prior examination, measuring 1.5 x 3.0 cm. There is again seen a probable yolk sac, best seen on image # 31 and cine sequence # 1 confirming this represents an ectopic adnexal pregnancy. This structure was better visualized on cine sequence # 4 on prior examination of 01/09/2020. Previously noted free fluid within the pelvis of 01/05/2020 has resolved. There is no free fluid noted within the pelvis at this time. IMPRESSION: Left adnexal ectopic pregnancy again identified.  No free fluid. Developing echogenic debris within the endometrial cavity likely representing blood product. Electronically Signed   By: Helyn Numbers MD   On: 01/12/2020 22:01     *Consult with Dr. Debroah Loop @ 2244 - notified of patient's complaints, assessments, lab & U/S results, tx plan recommend surgery to remove ectopic pregnancy - agrees with plan and will come consent patient  2320 Dr. Debroah Loop in to see patient. Patient last ate at 1930 will post for first thing tomorrow morning, but she will stay in-house until surgery  Assessment and Plan  Ectopic pregnancy, tubal  - To OR in the morning  - Observation in  MAU until surgery time - Dr. Debroah Loop to manage     Raelyn Mora, MSN, CNM 01/12/2020, 8:30 PM

## 2020-01-12 NOTE — MAU Note (Signed)
.   Valerie Taylor is a 34 y.o. at [redacted]w[redacted]d here in MAU reporting: Increasing left sided pain that got worse once she got here. Patient states she has had MTX x2 already. Pain in 10/10. NPO after 1900. No VB.   Pain score: 10 Vitals:   01/12/20 2019  BP: 113/60  Pulse: (!) 102  Resp: 19  Temp: 99 F (37.2 C)  SpO2: 100%

## 2020-01-13 ENCOUNTER — Encounter (HOSPITAL_COMMUNITY)
Admission: AD | Disposition: A | Discharge status: Home / Self Care | Payer: Self-pay | Source: Home / Self Care | Attending: Obstetrics & Gynecology

## 2020-01-13 ENCOUNTER — Other Ambulatory Visit: Payer: Self-pay

## 2020-01-13 ENCOUNTER — Observation Stay (HOSPITAL_COMMUNITY): Payer: 59 | Admitting: Certified Registered"

## 2020-01-13 ENCOUNTER — Encounter (HOSPITAL_COMMUNITY): Payer: Self-pay | Admitting: Obstetrics & Gynecology

## 2020-01-13 DIAGNOSIS — O00102 Left tubal pregnancy without intrauterine pregnancy: Secondary | ICD-10-CM

## 2020-01-13 DIAGNOSIS — Z3A01 Less than 8 weeks gestation of pregnancy: Secondary | ICD-10-CM

## 2020-01-13 DIAGNOSIS — O009 Unspecified ectopic pregnancy without intrauterine pregnancy: Secondary | ICD-10-CM

## 2020-01-13 HISTORY — PX: DIAGNOSTIC LAPAROSCOPY WITH REMOVAL OF ECTOPIC PREGNANCY: SHX6449

## 2020-01-13 LAB — TYPE AND SCREEN
ABO/RH(D): B POS
Antibody Screen: NEGATIVE

## 2020-01-13 LAB — BETA HCG QUANT (REF LAB): hCG Quant: 9820 m[IU]/mL

## 2020-01-13 LAB — RESPIRATORY PANEL BY RT PCR (FLU A&B, COVID)
Influenza A by PCR: NEGATIVE
Influenza B by PCR: NEGATIVE
SARS Coronavirus 2 by RT PCR: NEGATIVE

## 2020-01-13 SURGERY — LAPAROSCOPY, WITH ECTOPIC PREGNANCY SURGICAL TREATMENT
Anesthesia: General | Site: Abdomen

## 2020-01-13 SURGERY — EXCISION, CYST, OVARY, LAPAROSCOPIC
Anesthesia: General | Site: Abdomen

## 2020-01-13 MED ORDER — ONDANSETRON HCL 4 MG/2ML IJ SOLN
4.0000 mg | Freq: Once | INTRAMUSCULAR | Status: AC
  Administered 2020-01-13: 4 mg via INTRAVENOUS
  Filled 2020-01-13: qty 2

## 2020-01-13 MED ORDER — FENTANYL CITRATE (PF) 250 MCG/5ML IJ SOLN
INTRAMUSCULAR | Status: AC
  Filled 2020-01-13: qty 5

## 2020-01-13 MED ORDER — DEXAMETHASONE SODIUM PHOSPHATE 10 MG/ML IJ SOLN
INTRAMUSCULAR | Status: AC
  Filled 2020-01-13: qty 1

## 2020-01-13 MED ORDER — IBUPROFEN 800 MG PO TABS: 800.0000 mg | ORAL_TABLET | Freq: Three times a day (TID) | ORAL | 0 refills | Status: DC | PRN

## 2020-01-13 MED ORDER — ONDANSETRON HCL 4 MG/2ML IJ SOLN
INTRAMUSCULAR | Status: DC | PRN
  Administered 2020-01-13: 4 mg via INTRAVENOUS

## 2020-01-13 MED ORDER — LACTATED RINGERS IV SOLN: INTRAVENOUS | Status: DC

## 2020-01-13 MED ORDER — FENTANYL CITRATE (PF) 250 MCG/5ML IJ SOLN
INTRAMUSCULAR | Status: DC | PRN
Start: 2020-01-13 — End: 2020-01-13
  Administered 2020-01-13 (×5): 50 ug via INTRAVENOUS

## 2020-01-13 MED ORDER — BUPIVACAINE HCL 0.5 % IJ SOLN
INTRAMUSCULAR | Status: DC | PRN
  Administered 2020-01-13: 30 mL

## 2020-01-13 MED ORDER — DIPHENHYDRAMINE HCL 50 MG/ML IJ SOLN
INTRAMUSCULAR | Status: AC
  Filled 2020-01-13: qty 1

## 2020-01-13 MED ORDER — KETOROLAC TROMETHAMINE 30 MG/ML IJ SOLN
INTRAMUSCULAR | Status: DC | PRN
  Administered 2020-01-13: 30 mg via INTRAVENOUS

## 2020-01-13 MED ORDER — CHLORHEXIDINE GLUCONATE 0.12 % MT SOLN: 15.0000 mL | Freq: Once | OROMUCOSAL | Status: AC

## 2020-01-13 MED ORDER — ONDANSETRON HCL 4 MG/2ML IJ SOLN: 4.0000 mg | Freq: Once | INTRAMUSCULAR | Status: DC

## 2020-01-13 MED ORDER — SCOPOLAMINE 1 MG/3DAYS TD PT72: 1.0000 | MEDICATED_PATCH | TRANSDERMAL | Status: DC

## 2020-01-13 MED ORDER — ORAL CARE MOUTH RINSE: 15.0000 mL | Freq: Once | OROMUCOSAL | Status: AC

## 2020-01-13 MED ORDER — POVIDONE-IODINE 10 % EX SWAB: 2.0000 "application " | Freq: Once | CUTANEOUS | Status: DC

## 2020-01-13 MED ORDER — LIDOCAINE 2% (20 MG/ML) 5 ML SYRINGE
INTRAMUSCULAR | Status: DC | PRN
  Administered 2020-01-13: 80 mg via INTRAVENOUS

## 2020-01-13 MED ORDER — ONDANSETRON HCL 4 MG/2ML IJ SOLN
INTRAMUSCULAR | Status: AC
  Filled 2020-01-13: qty 2

## 2020-01-13 MED ORDER — SUGAMMADEX SODIUM 200 MG/2ML IV SOLN
INTRAVENOUS | Status: DC | PRN
  Administered 2020-01-13: 200 mg via INTRAVENOUS

## 2020-01-13 MED ORDER — LIDOCAINE 2% (20 MG/ML) 5 ML SYRINGE
INTRAMUSCULAR | Status: AC
  Filled 2020-01-13: qty 5

## 2020-01-13 MED ORDER — SODIUM CHLORIDE 0.9 % IR SOLN
Status: DC | PRN
  Administered 2020-01-13: 3000 mL

## 2020-01-13 MED ORDER — PHENYLEPHRINE HCL-NACL 10-0.9 MG/250ML-% IV SOLN
INTRAVENOUS | Status: DC | PRN
  Administered 2020-01-13: 25 ug/min via INTRAVENOUS

## 2020-01-13 MED ORDER — ROCURONIUM BROMIDE 10 MG/ML (PF) SYRINGE
PREFILLED_SYRINGE | INTRAVENOUS | Status: DC | PRN
  Administered 2020-01-13: 50 mg via INTRAVENOUS

## 2020-01-13 MED ORDER — BUPIVACAINE HCL (PF) 0.5 % IJ SOLN
INTRAMUSCULAR | Status: AC
  Filled 2020-01-13: qty 30

## 2020-01-13 MED ORDER — DIPHENHYDRAMINE HCL 50 MG/ML IJ SOLN
INTRAMUSCULAR | Status: DC | PRN
  Administered 2020-01-13: 6.25 mg via INTRAVENOUS

## 2020-01-13 MED ORDER — PROPOFOL 10 MG/ML IV BOLUS
INTRAVENOUS | Status: AC
  Filled 2020-01-13: qty 40

## 2020-01-13 MED ORDER — DEXAMETHASONE SODIUM PHOSPHATE 10 MG/ML IJ SOLN
INTRAMUSCULAR | Status: DC | PRN
  Administered 2020-01-13: 10 mg via INTRAVENOUS

## 2020-01-13 MED ORDER — SUCCINYLCHOLINE CHLORIDE 200 MG/10ML IV SOSY
PREFILLED_SYRINGE | INTRAVENOUS | Status: AC
  Filled 2020-01-13: qty 10

## 2020-01-13 MED ORDER — ROCURONIUM BROMIDE 10 MG/ML (PF) SYRINGE
PREFILLED_SYRINGE | INTRAVENOUS | Status: AC
  Filled 2020-01-13: qty 10

## 2020-01-13 MED ORDER — SUCCINYLCHOLINE CHLORIDE 200 MG/10ML IV SOSY
PREFILLED_SYRINGE | INTRAVENOUS | Status: DC | PRN
  Administered 2020-01-13: 140 mg via INTRAVENOUS

## 2020-01-13 MED ORDER — MIDAZOLAM HCL 2 MG/2ML IJ SOLN
INTRAMUSCULAR | Status: DC | PRN
  Administered 2020-01-13: 2 mg via INTRAVENOUS

## 2020-01-13 MED ORDER — CHLORHEXIDINE GLUCONATE 0.12 % MT SOLN
OROMUCOSAL | Status: AC
  Administered 2020-01-13: 15 mL via OROMUCOSAL
  Filled 2020-01-13: qty 15

## 2020-01-13 MED ORDER — SCOPOLAMINE 1 MG/3DAYS TD PT72
MEDICATED_PATCH | TRANSDERMAL | Status: AC
  Administered 2020-01-13: 1.5 mg via TRANSDERMAL
  Filled 2020-01-13: qty 1

## 2020-01-13 MED ORDER — MIDAZOLAM HCL 2 MG/2ML IJ SOLN
INTRAMUSCULAR | Status: AC
  Filled 2020-01-13: qty 2

## 2020-01-13 MED ORDER — PROPOFOL 10 MG/ML IV BOLUS
INTRAVENOUS | Status: DC | PRN
  Administered 2020-01-13: 150 mg via INTRAVENOUS

## 2020-01-13 MED ORDER — KETOROLAC TROMETHAMINE 30 MG/ML IJ SOLN
INTRAMUSCULAR | Status: AC
  Filled 2020-01-13: qty 1

## 2020-01-13 MED ORDER — OXYCODONE HCL 5 MG PO TABS: 5.0000 mg | ORAL_TABLET | Freq: Four times a day (QID) | ORAL | 0 refills | Status: DC | PRN

## 2020-01-13 SURGICAL SUPPLY — 36 items
APPLICATOR ARISTA FLEXITIP XL (MISCELLANEOUS) IMPLANT
DERMABOND ADVANCED (GAUZE/BANDAGES/DRESSINGS) ×1
DERMABOND ADVANCED .7 DNX12 (GAUZE/BANDAGES/DRESSINGS) ×1 IMPLANT
DRSG OPSITE POSTOP 3X4 (GAUZE/BANDAGES/DRESSINGS) ×2 IMPLANT
DURAPREP 26ML APPLICATOR (WOUND CARE) ×2 IMPLANT
GLOVE BIO SURGEON STRL SZ7.5 (GLOVE) ×2 IMPLANT
GLOVE BIOGEL PI IND STRL 7.0 (GLOVE) ×2 IMPLANT
GLOVE BIOGEL PI IND STRL 8 (GLOVE) ×1 IMPLANT
GLOVE BIOGEL PI INDICATOR 7.0 (GLOVE) ×2
GLOVE BIOGEL PI INDICATOR 8 (GLOVE) ×1
GOWN STRL REUS W/ TWL LRG LVL3 (GOWN DISPOSABLE) ×1 IMPLANT
GOWN STRL REUS W/ TWL XL LVL3 (GOWN DISPOSABLE) ×1 IMPLANT
GOWN STRL REUS W/TWL LRG LVL3 (GOWN DISPOSABLE) ×1
GOWN STRL REUS W/TWL XL LVL3 (GOWN DISPOSABLE) ×1
HEMOSTAT ARISTA ABSORB 3G PWDR (HEMOSTASIS) IMPLANT
KIT TURNOVER KIT B (KITS) ×2 IMPLANT
NDL INSUFF ACCESS 14 VERSASTEP (NEEDLE) IMPLANT
NS IRRIG 1000ML POUR BTL (IV SOLUTION) ×2 IMPLANT
PACK LAPAROSCOPY BASIN (CUSTOM PROCEDURE TRAY) ×2 IMPLANT
PACK TRENDGUARD 450 HYBRID PRO (MISCELLANEOUS) ×1 IMPLANT
POUCH SPECIMEN RETRIEVAL 10MM (ENDOMECHANICALS) ×2 IMPLANT
PROTECTOR NERVE ULNAR (MISCELLANEOUS) ×4 IMPLANT
SET IRRIG TUBING LAPAROSCOPIC (IRRIGATION / IRRIGATOR) ×2 IMPLANT
SET TUBE SMOKE EVAC HIGH FLOW (TUBING) ×2 IMPLANT
SHEARS HARMONIC ACE PLUS 36CM (ENDOMECHANICALS) ×2 IMPLANT
SLEEVE XCEL OPT CAN 5 100 (ENDOMECHANICALS) ×2 IMPLANT
SUT MNCRL AB 4-0 PS2 18 (SUTURE) ×2 IMPLANT
SUT VICRYL 0 UR6 27IN ABS (SUTURE) ×2 IMPLANT
TOWEL GREEN STERILE FF (TOWEL DISPOSABLE) ×4 IMPLANT
TRAY FOLEY W/BAG SLVR 14FR (SET/KITS/TRAYS/PACK) ×2 IMPLANT
TRENDGUARD 450 HYBRID PRO PACK (MISCELLANEOUS) ×2
TROCAR BALLN 12MMX100 BLUNT (TROCAR) ×2 IMPLANT
TROCAR VERSASTEP PLUS 12MM (TROCAR) IMPLANT
TROCAR VERSASTEP PLUS 5MM (TROCAR) IMPLANT
TROCAR XCEL NON-BLD 5MMX100MML (ENDOMECHANICALS) ×2 IMPLANT
WARMER LAPAROSCOPE (MISCELLANEOUS) ×2 IMPLANT

## 2020-01-13 NOTE — Progress Notes (Signed)
Pt requesting quietly- states pain medication effective-denies need for phenergan or antimeric at this time.  Call bell at head of bed. Side rails up x2. Husband at bedside

## 2020-01-13 NOTE — Anesthesia Procedure Notes (Signed)
Procedure Name: Intubation Date/Time: 01/13/2020 9:58 AM Performed by: Modena Morrow, CRNA Pre-anesthesia Checklist: Patient identified, Emergency Drugs available, Suction available and Patient being monitored Patient Re-evaluated:Patient Re-evaluated prior to induction Oxygen Delivery Method: Circle system utilized Preoxygenation: Pre-oxygenation with 100% oxygen Induction Type: IV induction Ventilation: Mask ventilation without difficulty Laryngoscope Size: Miller and 2 Grade View: Grade I Tube type: Oral Tube size: 7.0 mm Number of attempts: 1 Airway Equipment and Method: Stylet and Oral airway Placement Confirmation: ETT inserted through vocal cords under direct vision,  positive ETCO2 and breath sounds checked- equal and bilateral Secured at: 21 cm Tube secured with: Tape Dental Injury: Teeth and Oropharynx as per pre-operative assessment

## 2020-01-13 NOTE — Progress Notes (Signed)
Nausea better, up to void, changed peripad--minimal blood drainage on pad, voided and wiped scant amount of old blood. New peripad placed.   Pt going to eat some saltine crackers and gingerale.   Will reassess and plan for discharge if tolerating po given.

## 2020-01-13 NOTE — Progress Notes (Signed)
Report given to short stay RN  

## 2020-01-13 NOTE — Discharge Instructions (Signed)
Laparoscopic Tubal Removal for Ectopic Pregnancy Discharge Instructions Laparoscopic tubal removal is a procedure that removes the fallopian tube containing the ectopic pregnancy. RISKS AND COMPLICATIONS   Infection.  Bleeding.  Injury to surrounding organs.  Anesthetic side effects.  Failure of the procedure.  Risks of future ectopic pregnancy on the other side PROCEDURE   You may be given a medicine to help you relax (sedative) before the procedure. You will be given a medicine to make you sleep (general anesthetic) during the procedure.  A tube will be put down your throat to help your breath while under general anesthesia.  Two small cuts (incisions) are made in the lower abdominal area and one incision is made near the belly button.  Your abdominal area will be inflated with a safe gas (carbon dioxide). This helps give the surgeon room to operate, visualize, and helps the surgeon avoid other organs.  A thin, lighted tube (laparoscope) with a camera attached is inserted into your abdomen through the incision near the belly button. Other small instruments are also inserted through the other abdominal incisions.  The fallopian tube is located and are removed.  After the fallopian tube is removed, the gas is released from the abdomen.  The incisions will be closed with stitches (sutures), and Dermabond. A bandage may be placed over the incisions. AFTER THE PROCEDURE   You will also have some mild abdominal discomfort for 3-7 days. You will be given pain medicine to ease any discomfort.  As long as there are no problems, you may be allowed to go home. Someone will need to drive you home and be with you for at least 24 hours once home.  You may have some mild discomfort in the throat. This is from the tube placed in your throat while you were sleeping.  You may experience discomfort in the shoulder area from some trapped air between the liver and diaphragm. This sensation is  normal and will slowly go away on its own. HOME CARE INSTRUCTIONS   Take all medicines as directed.  Only take over-the-counter or prescription medicines for pain, discomfort, or fever as directed by your caregiver.  Resume daily activities as directed.  Showers are preferred over baths.  You may resume sexual activities in 1 week or as directed.  Do not drive while taking narcotics. SEEK MEDICAL CARE IF: .  There is increasing abdominal pain.  You feel lightheaded or faint.  You have the chills.  You have an oral temperature above 102 F (38.9 C).  There is pus-like (purulent) drainage from any of the wounds.  You are unable to pass gas or have a bowel movement.  You feel sick to your stomach (nauseous) or throw up (vomit). MAKE SURE YOU:   Understand these instructions.  Will watch your condition.  Will get help right away if you are not doing well or get worse.  [employer] Patient Information 2013 [employer].     

## 2020-01-13 NOTE — H&P (Signed)
Valerie Taylor is an 34 y.o. female. [id number] Patient's last menstrual period was 11/28/2019 (exact date). [redacted]w[redacted]d Patient presented with early pregnancy and pelvic pain to [facility] 01/05/20. No IUP was identified and she was diagnosed with left ectopic pregnancy with a probable yolk sac in the adnexa. Quant HCG was 7484 that day and she received MTX IM.  She had labs at [facility] and hcg rose from 7,484 on 10/4 when methotrexate was given to 10, 324 on Day 4. On 10/8 she received another MTX dose with HCG 14265. F/U HCG 10/11 was 9820 and 40347, and she reported increased pain and came to [facility]U. She last ate at 1930 and has little appetite now with no nausea.  Pertinent Gynecological History: Patient's last menstrual period was 11/28/2019 (exact date).  Bleeding: none current Contraception: none  Sexually transmitted diseases: no past history Last pap: abnormal: nl cytology with pos HR HPV Date: 12/2018 Ectopic hx   Menstrual History: Patient's last menstrual period was 11/28/2019 (exact date).    Past Medical History:  Diagnosis Date  . Headache   . History of ectopic pregnancy 12/06/2017  . History of pregnancy loss in prior pregnancy, currently pregnant 12/06/2017   4 months loss- patient unclear of events Records requested  . Infection    UTI  . Kidney stones     Past Surgical History:  Procedure Laterality Date  . NO PAST SURGERIES      Family History  Problem Relation Age of Onset  . Healthy Mother   . Healthy Father   . Asthma Sister   . Cancer Maternal Aunt        breast    Social History:  reports that she has never smoked. She has never used smokeless tobacco. She reports that she does not drink alcohol and does not use drugs.  Allergies: No Known Allergies  Medications Prior to Admission  Medication Sig Dispense Refill Last Dose  . acetaminophen (TYLENOL) 500 MG tablet Take 500 mg by mouth every 6 (six) hours as needed for headache.   01/12/2020 at Unknown time  .  lactobacillus acidophilus & bulgar (LACTINEX) chewable tablet Chew 1 tablet by mouth 3 (three) times daily with meals. 180 tablet 3   . metoCLOPramide (REGLAN) 10 MG tablet Take 1 tablet (10 mg total) by mouth 3 (three) times daily with meals as needed for nausea. 30 tablet 0   . promethazine (PHENERGAN) 12.5 MG tablet Take 1-2 tablets (12.5-25 mg total) by mouth at bedtime as needed for nausea or vomiting. 30 tablet 0   . traMADol (ULTRAM) 50 MG tablet Take 1 tablet (50 mg total) by mouth every 6 (six) hours as needed. 15 tablet 0     Review of Systems  Constitutional: Positive for appetite change.  Respiratory: Negative.   Cardiovascular: Negative.   Gastrointestinal: Positive for abdominal pain. Negative for abdominal distention, nausea and vomiting.  Genitourinary: Positive for pelvic pain. Negative for vaginal bleeding.    Blood pressure 128/67, pulse 85, temperature 98.3 F (36.8 C), temperature source Oral, resp. rate 17, last menstrual period 11/28/2019, SpO2 100 %, currently breastfeeding. Physical Exam Vitals and nursing note reviewed. Exam conducted with a chaperone present.  Constitutional:      General: She is not in acute distress. HENT:     Head: Normocephalic.  Eyes:     Pupils: Pupils are equal, round, and reactive to light.  Cardiovascular:     Rate and Rhythm: Normal rate.  Pulmonary:     Effort:  Pulmonary effort is normal.  Abdominal:     General: Abdomen is flat.     Tenderness: There is abdominal tenderness (mild LLQ no guarding or rebound).  Musculoskeletal:     Cervical back: Normal range of motion.  Neurological:     Mental Status: She is alert.     Results for orders placed or performed during the hospital encounter of 01/12/20 (from the past 24 hour(s))  hCG, quantitative, pregnancy     Status: Abnormal   Collection Time: 01/12/20  8:57 PM  Result Value Ref Range   hCG, Beta Chain, Quant, S 11,906 (H) <5 mIU/mL  CBC     Status: Abnormal    Collection Time: 01/12/20 11:11 PM  Result Value Ref Range   WBC 9.5 4.0 - 10.5 K/uL   RBC 3.46 (L) 3.87 - 5.11 MIL/uL   Hemoglobin 10.5 (L) 12.0 - 15.0 g/dL   HCT 02.7 (L) 36 - 46 %   MCV 92.8 80.0 - 100.0 fL   MCH 30.3 26.0 - 34.0 pg   MCHC 32.7 30.0 - 36.0 g/dL   RDW 74.1 28.7 - 86.7 %   Platelets 288 150 - 400 K/uL   nRBC 0.0 0.0 - 0.2 %    US OB Transvaginal  Result Date: 01/12/2020 CLINICAL DATA:  Abdominal pain, pregnancy of unknown location EXAM: TRANSVAGINAL OB ULTRASOUND TECHNIQUE: Transvaginal ultrasound was performed for complete evaluation of the gestation as well as the maternal uterus, adnexal regions, and pelvic cul-de-sac. COMPARISON:  01/09/2020, 01/05/2020 FINDINGS: Intrauterine gestational sac: None identified Maternal uterus/adnexae: The uterus is anteverted. No uterine masses are identified. The endometrium is thickened, similar to that noted on prior examination, however, there is developing hypoechoic debris within the endometrial cavity likely representing blood product. As noted above, no intrauterine gestational sac is identified. The cervix is closed. The right ovary is normal in size and echogenicity and demonstrates normal color flow vascularity. The left ovary measures roughly 1.6 x 2.8 cm, best seen on image # 30. There is an adjacent echogenic soft tissue mass, similar to that seen on prior examination, measuring 1.5 x 3.0 cm. There is again seen a probable yolk sac, best seen on image # 31 and cine sequence # 1 confirming this represents an ectopic adnexal pregnancy. This structure was better visualized on cine sequence # 4 on prior examination of 01/09/2020. Previously noted free fluid within the pelvis of 01/05/2020 has resolved. There is no free fluid noted within the pelvis at this time. IMPRESSION: Left adnexal ectopic pregnancy again identified.  No free fluid. Developing echogenic debris within the endometrial cavity likely representing blood product.  Electronically Signed   By: Helyn Numbers MD   On: 01/12/2020 22:01    Assessment/Plan: Unruptured probable left ectopic pregnancy with increased pain and plateau HCG level after 2 doses of MTX. Plan non-emergent laparoscopic removal and possible salpingectomy, as the patient ate at 1930 and [place] does not show evidence of rupture.  The risks of surgery were discussed in detail with the patient including but not limited to: bleeding which may require transfusion or reoperation; infection which may require prolonged hospitalization or re-hospitalization and antibiotic therapy; injury to bowel, bladder, ureters and major vessels or other surrounding organs; formation of adhesions; need for additional procedures including laparotomy or subsequent procedures secondary to abnormal pathology; thromboembolic phenomenon; incisional problems and other postoperative or anesthesia complications.  Patient was told that the likelihood that her condition and symptoms will be treated effectively with this surgical  management was very high; the postoperative expectations were also discussed in detail. The patient also understands the alternative treatment options which were discussed in full. All questions were answered. She will be on observation and will be assessed in the morning with surgery tentative for 0900.  Scheryl Darter 01/13/2020, 12:01 AM

## 2020-01-13 NOTE — Anesthesia Postprocedure Evaluation (Signed)
Anesthesia Post Note  Patient: Valerie Taylor  Procedure(s) Performed: DIAGNOSTIC LAPAROSCOPY WITH REMOVAL OF ECTOPIC PREGNANCY POSSIBLE SALPINGECTOMY (N/A Abdomen)     Patient location during evaluation: PACU Anesthesia Type: General Level of consciousness: sedated Pain management: pain level controlled Vital Signs Assessment: post-procedure vital signs reviewed and stable Respiratory status: spontaneous breathing and respiratory function stable Cardiovascular status: stable Postop Assessment: no apparent nausea or vomiting Anesthetic complications: no   No complications documented.  Last Vitals:  Vitals:   01/13/20 1139 01/13/20 1157  BP: 106/62 108/62  Pulse: 76 76  Resp: 16 18  Temp: 36.7 C 36.7 C  SpO2: 100% 100%    Last Pain:  Vitals:   01/13/20 1157  TempSrc: Oral  PainSc:                  Mellody Dance

## 2020-01-13 NOTE — Anesthesia Preprocedure Evaluation (Addendum)
Anesthesia Evaluation  Patient identified by MRN, date of birth, ID band Patient awake    Reviewed: Allergy & Precautions, NPO status , Patient's Chart, lab work & pertinent test results  Airway Mallampati: I  TM Distance: >3 FB Neck ROM: Full    Dental no notable dental hx.    Pulmonary neg pulmonary ROS,    Pulmonary exam normal breath sounds clear to auscultation       Cardiovascular Exercise Tolerance: Good negative cardio ROS Normal cardiovascular exam Rhythm:Regular Rate:Normal     Neuro/Psych  Headaches, negative psych ROS   GI/Hepatic negative GI ROS, Neg liver ROS,   Endo/Other  negative endocrine ROS  Renal/GU Renal disease (kidney stones)     Musculoskeletal negative musculoskeletal ROS (+)   Abdominal (+)  Abdomen: tender.    Peds negative pediatric ROS (+)  Hematology  (+) anemia ,   Anesthesia Other Findings   Reproductive/Obstetrics Ectopic pregnancy                            Anesthesia Physical Anesthesia Plan  ASA: II and emergent  Anesthesia Plan: General   Post-op Pain Management:    Induction: Intravenous  PONV Risk Score and Plan: 3 and Scopolamine patch - Pre-op, Midazolam, Dexamethasone and Ondansetron  Airway Management Planned: Oral ETT  Additional Equipment:   Intra-op Plan:   Post-operative Plan: Extubation in OR  Informed Consent: I have reviewed the patients History and Physical, chart, labs and discussed the procedure including the risks, benefits and alternatives for the proposed anesthesia with the patient or authorized representative who has indicated his/her understanding and acceptance.       Plan Discussed with: CRNA, Anesthesiologist and Surgeon  Anesthesia Plan Comments:         Anesthesia Quick Evaluation

## 2020-01-13 NOTE — Op Note (Signed)
Valerie Taylor PROCEDURE DATE: 01/13/2020  PREOPERATIVE DIAGNOSIS:  Ectopic pregnancy POSTOPERATIVE DIAGNOSIS:  left fallopian tube ectopic pregnancy PROCEDURE: Laparoscopic left salpingectomy and removal of ectopic pregnancy SURGEON:  Dr. Nettie Elm ANESTHESIOLOGIST: No responsible provider has been recorded for the case. Anesthesiologist: Mellody Dance, MD CRNA: Modena Morrow, CRNA  INDICATIONS: 34 y.o. [phone] at [redacted]w[redacted]d here with the preoperative diagnoses as listed above. She had failed MTX treatment for a left ectopic pregnancy  Please refer to preoperative notes for more details. Patient was counseled regarding need for laparoscopic salpingectomy. Risks of surgery including bleeding which may require transfusion or reoperation, infection, injury to bowel or other surrounding organs, need for additional procedures including laparotomy and other postoperative/anesthesia complications were explained to patient.  Written informed consent was obtained.  FINDINGS:  Dilated left fallopian tube containing ectopic gestation. Small normal appearing uterus, normal right fallopian tube, right ovary and left ovary.  ANESTHESIA: General INTRAVENOUS FLUIDS: As recorded ESTIMATED BLOOD LOSS: 50 cc URINE OUTPUT:As recorded SPECIMENS: Left fallopian tube containing ectopic gestation COMPLICATIONS: None immediate  PROCEDURE IN DETAIL:  The patient was taken to the operating room where general anesthesia was administered and was found to be adequate.  She was placed in the dorsal lithotomy position, and was prepped and draped in a sterile manner.  A Foley catheter was inserted into her bladder and attached to constant drainage and a uterine manipulator was then advanced into the uterus .  After an adequate timeout was performed, attention was then turned to the patient's abdomen where a 10-mm skin incision was made on the umbilical fold.    After an adequate timeout was performed, attention was  turned to the abdomen where an umbilical incision was made with the scalpel.  The Optiview 11-mm trocar and sleeve were then advanced without difficulty with the laparoscope under direct visualization into the abdomen.  The abdomen was then insufflated with carbon dioxide gas and adequate pneumoperitoneum was obtained.  A survey of the patient's pelvis and abdomen revealed the findings above.  Two 5-mm left lower quadrant ports were then placed under direct visualization.  The Nezhat suction irrigator was then used to suction the hemoperitoneum and irrigate the pelvis.  Attention was then turned to the left fallopian tube which was grasped and ligated from the underlying mesosalpinx and uterine attachment using the Harmonic instrument.  Good hemostasis was noted.  The specimen was placed in an EndoCatch bag and removed from the abdomen intact.  The abdomen was desufflated, and all instruments were removed.  The fascial incision of the 10-mm site was reapproximated with a 0 Vicryl figure-of-eight stitch; and all skin incisions were closed with 3-0 Vicryl and Dermabond. The patient tolerated the procedure well.  All instruments, needles, and sponge counts were correct x 2. The patient was taken to the recovery room in stable condition.   The patient will be discharged to home as per PACU criteria.  Routine postoperative instructions given.  She was prescribed Percocet and  Ibuprofen.  She will follow up in the clinic in about 2-3 weeks for postoperative evaluation.   Nettie Elm, MD, FACOG Attending Obstetrician & Gynecologist Faculty Practice, [facility]

## 2020-01-13 NOTE — Progress Notes (Addendum)
Pt tolerating po, pain minimal she states, no medication required.    Discharge instructions reviewed with pt and significant other.   Copy of instructions  given to pt, scripts sent in electronically by MD pt informed.     At 1720   Pt d/c'd via wheelchair with belongings, with sig other.            Escorted by unit staff.   (pt declined wheelchair, pt wanted to walk)

## 2020-01-13 NOTE — Transfer of Care (Signed)
Immediate Anesthesia Transfer of Care Note  Patient: Valerie Taylor  Procedure(s) Performed: DIAGNOSTIC LAPAROSCOPY WITH REMOVAL OF ECTOPIC PREGNANCY POSSIBLE SALPINGECTOMY (N/A Abdomen)  Patient Location: PACU  Anesthesia Type:General  Level of Consciousness: drowsy and patient cooperative  Airway & Oxygen Therapy: Patient Spontanous Breathing and Patient connected to face mask oxygen  Post-op Assessment: Report given to RN and Post -op Vital signs reviewed and stable  Post vital signs: Reviewed and stable  Last Vitals:  Vitals Value Taken Time  BP 113/63 01/13/20 1109  Temp    Pulse 85 01/13/20 1109  Resp 19 01/13/20 1109  SpO2 100 % 01/13/20 1109  Vitals shown include unvalidated device data.  Last Pain:  Vitals:   01/13/20 0850  TempSrc:   PainSc: 0-No pain         Complications: No complications documented.

## 2020-01-14 ENCOUNTER — Other Ambulatory Visit: Payer: 59

## 2020-01-14 ENCOUNTER — Encounter (HOSPITAL_COMMUNITY): Payer: Self-pay | Admitting: Obstetrics and Gynecology

## 2020-01-14 LAB — SURGICAL PATHOLOGY

## 2020-01-15 ENCOUNTER — Other Ambulatory Visit: Payer: 59

## 2020-02-04 ENCOUNTER — Encounter: Payer: Self-pay | Admitting: Obstetrics and Gynecology

## 2020-02-04 ENCOUNTER — Ambulatory Visit (INDEPENDENT_AMBULATORY_CARE_PROVIDER_SITE_OTHER): Payer: 59 | Admitting: Obstetrics and Gynecology

## 2020-02-04 ENCOUNTER — Other Ambulatory Visit: Payer: Self-pay

## 2020-02-04 DIAGNOSIS — F329 Major depressive disorder, single episode, unspecified: Secondary | ICD-10-CM

## 2020-02-04 DIAGNOSIS — Z9079 Acquired absence of other genital organ(s): Secondary | ICD-10-CM

## 2020-02-04 DIAGNOSIS — F32A Depression, unspecified: Secondary | ICD-10-CM | POA: Insufficient documentation

## 2020-02-04 MED ORDER — ESCITALOPRAM OXALATE 10 MG PO TABS: 10.0000 mg | ORAL_TABLET | Freq: Every day | ORAL | 5 refills | Status: DC

## 2020-02-04 NOTE — Progress Notes (Signed)
Valerie Taylor presents for post op visit. S/P left salpingectomy 01/13/20 d/t ectopic pregnancy Pathology reviewed with pt Pt is doing well overall. She denies any bowel or bladder dysfunction.  She does feel depressed and overwhelmed d/t to work, family life, husband is a overnight truckdriver. She works at [employer].  She denies any Homo/Suicidal ideations  PE AF VSS Tearful at times   Lungs clear Heart RRR Abd soft + BS incisions well healed  A/P Post op visit        Depression, reaction/situational  Return to normal activities as tolerates. Discussed referral for counseling and antidepressant medication. Pt is agreeable to both. Referral made. Will start Lexapro. U/R/B reviewed. F/U in 2 months.

## 2020-02-04 NOTE — Progress Notes (Signed)
Pt is in the office post op for ectopic pregnancy on 01-13-20. Pt reports pain as 3/10 today but states that she is fine, denies any complications.

## 2020-02-09 ENCOUNTER — Encounter: Payer: 59 | Admitting: Licensed Clinical Social Worker

## 2020-02-10 IMAGING — US US MFM OB COMP +14 WKS
1 series · 13 of 28 positions shown · non-contrast
Comparison: none

[Series 1: us mfm ob comp +14 wks · 13 of 94 slices shown]
[im 4/94]
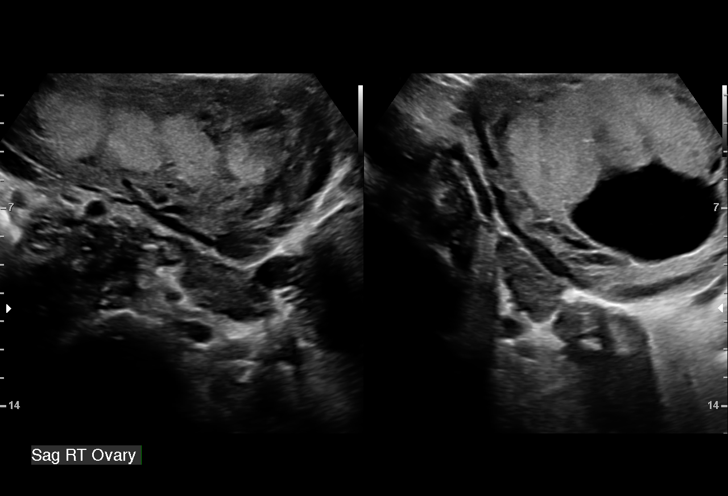
[im 11/94]
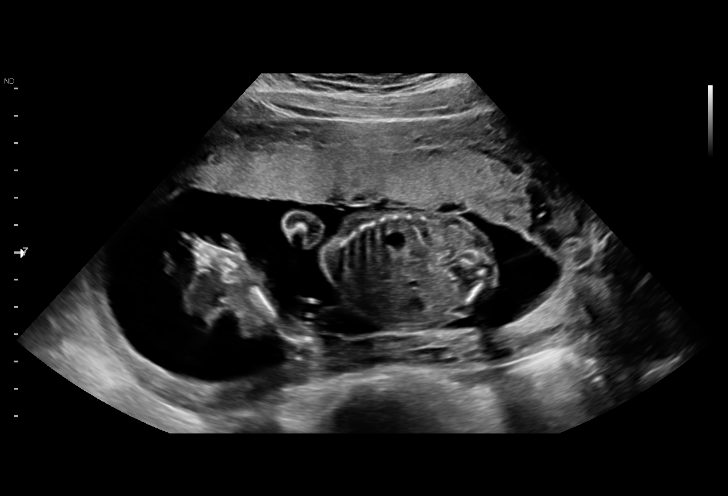
[im 18/94]
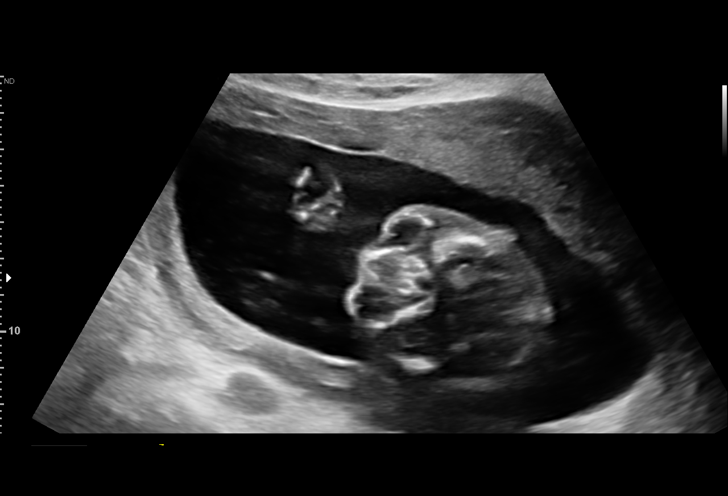
[im 25/94]
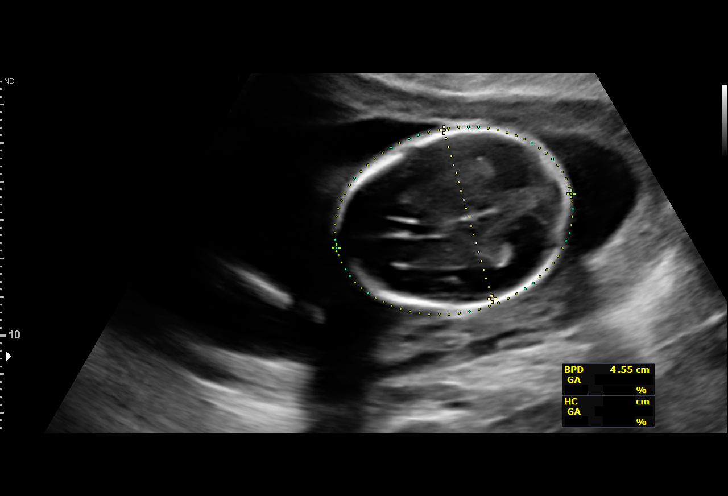
[im 32/94]
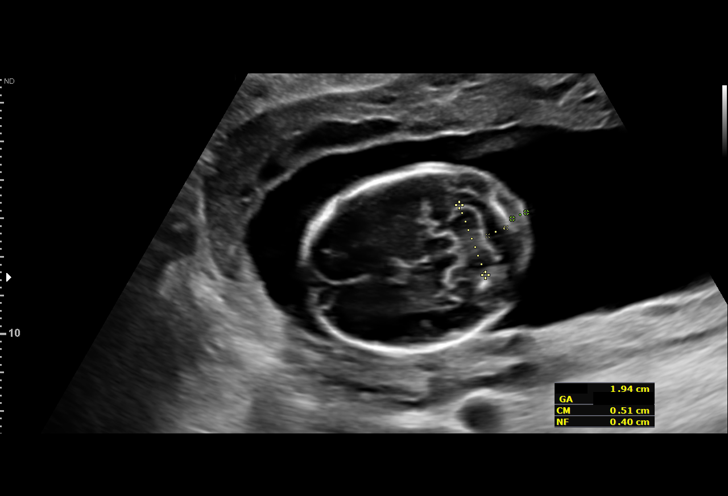
[im 38/94]
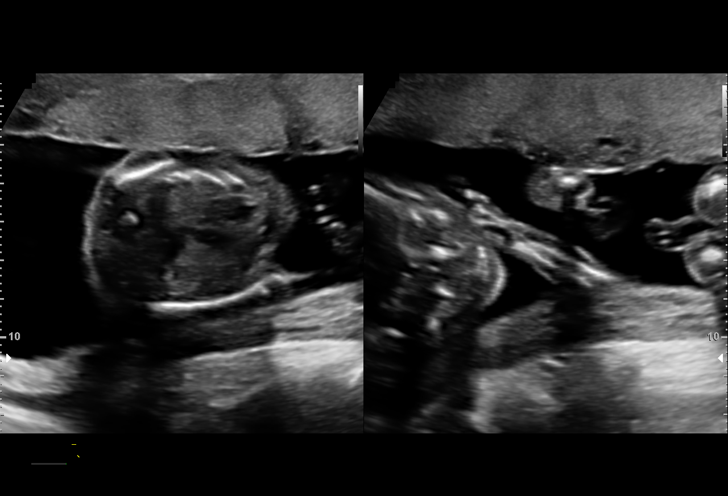
[im 49/94]
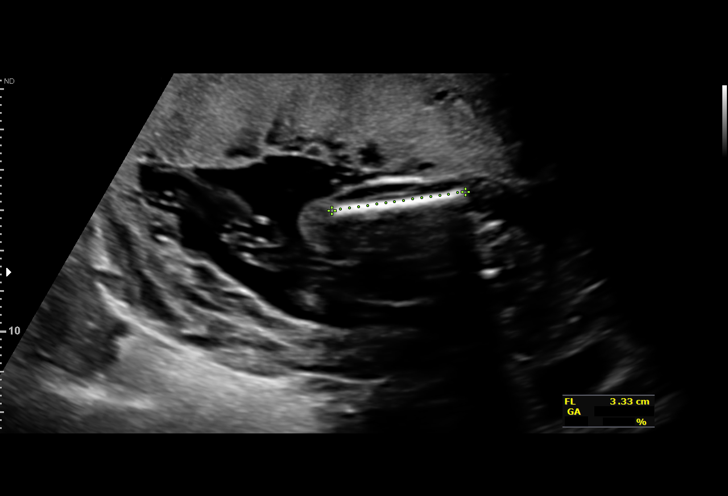
[im 56/94]
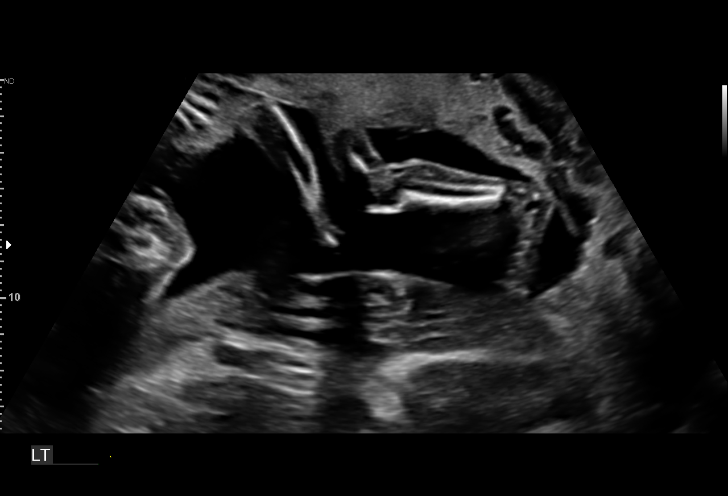
[im 63/94]
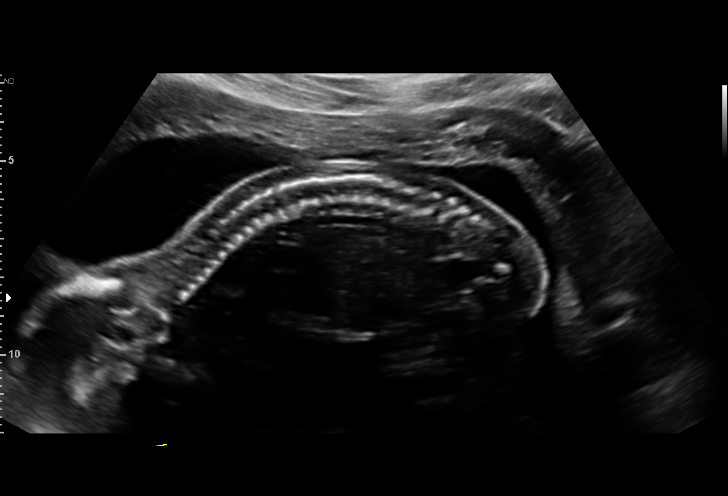
[im 69/94]
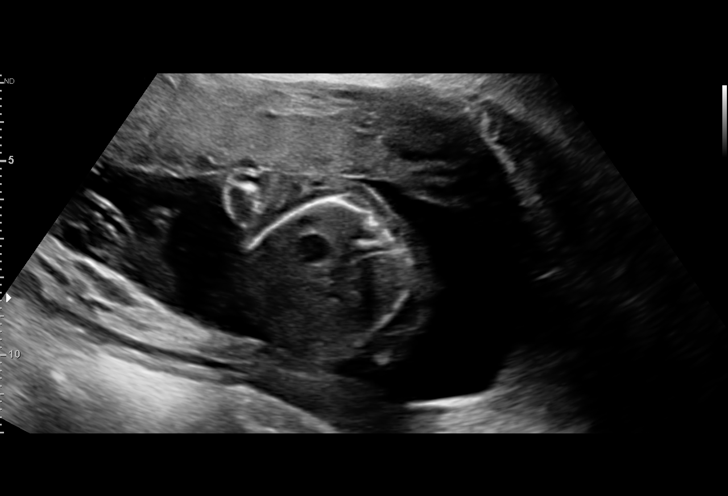
[im 76/94]
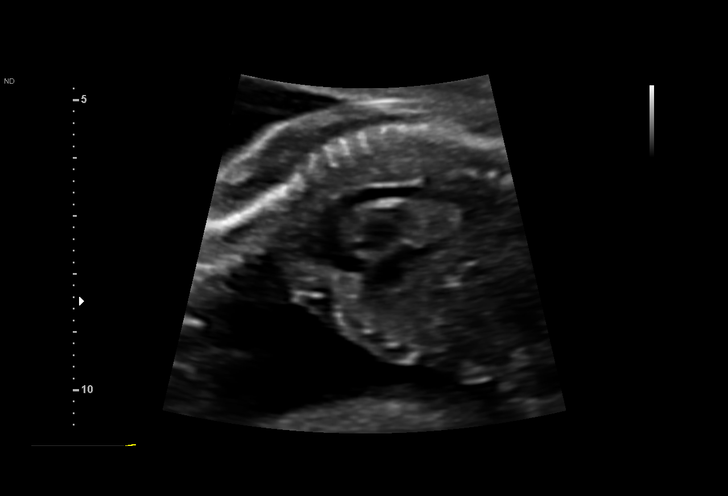
[im 83/94]
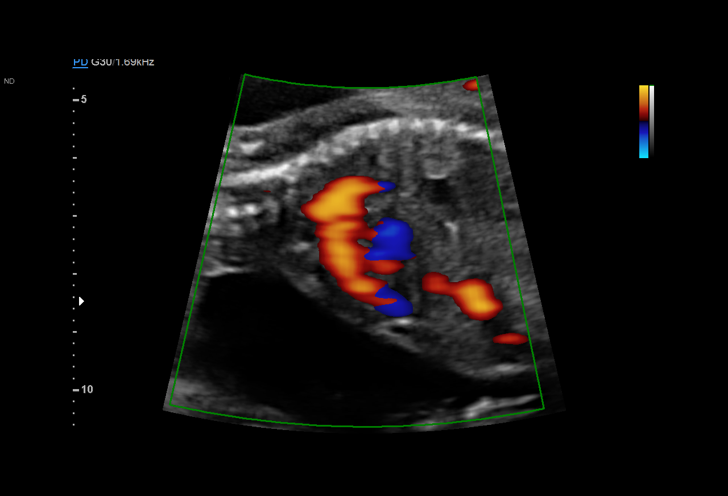
[im 90/94]
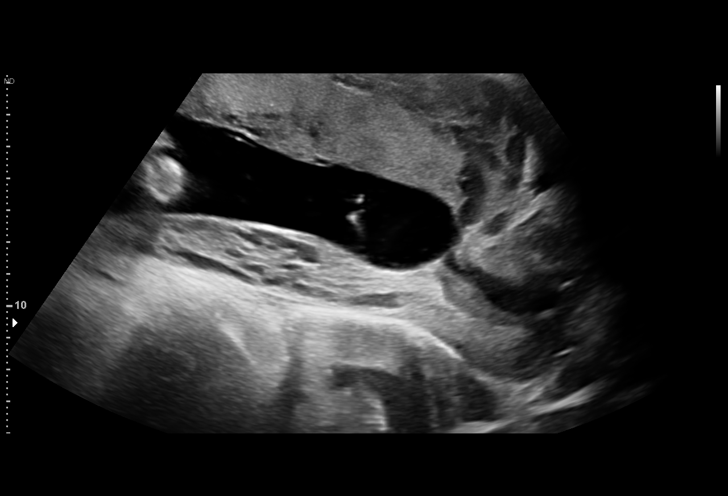

[13 of 28 positions shown; findings below may reference images not displayed]

[REDACTED]care -
[REDACTED]
Ref. Address:     Faculty

1  US MFM OB COMP + 14 WK               [address].01     NICHO GIST

Indications

19 weeks gestation of pregnancy
Encounter for antenatal screening for
malformations
Poor obstetric history: Previous midtrimester
loss (@ 16 weeks)
Fetal Evaluation

Num Of Fetuses:         1
Fetal Heart Rate(bpm):  142
Cardiac Activity:       Observed
Presentation:           Transverse, head to maternal left
Placenta:               Low-lying, 7 mm from int os
P. Cord Insertion:      Visualized, central

Amniotic Fluid
AFI FV:      Within normal limits

Largest Pocket(cm)
6.75
Biometry

BPD:      45.4  mm     G. Age:  19w 5d         69  %    CI:        70.21   %    70 - 86
FL/HC:      17.8   %    16.1 -
HC:      172.8  mm     G. Age:  19w 6d         69  %    HC/AC:      1.17        1.09 -
AC:      147.2  mm     G. Age:  20w 0d         69  %    FL/BPD:     67.8   %
FL:       30.8  mm     G. Age:  19w 4d         52  %    FL/AC:      20.9   %    20 - 24
HUM:      30.8  mm     G. Age:  20w 2d         75  %
CER:      19.4  mm     G. Age:  18w 5d         35  %
NFT:         4  mm

LV:        7.9  mm
CM:        5.5  mm

Est. FW:     313  gm    0 lb 11 oz      54  %
OB History

Gravidity:    3         Term:   0        Prem:   0        SAB:   1
TOP:          0       Ectopic:  1        Living: 0
Gestational Age

LMP:           19w 2d        Date:  09/17/17                 EDD:   06/24/18
U/S Today:     19w 6d                                        EDD:   06/20/18
Best:          19w 2d     Det. By:  LMP  (09/17/17)          EDD:   06/24/18
Anatomy

Cranium:               Appears normal         LVOT:                   Not well visualized
Cavum:                 Appears normal         Aortic Arch:            Appears normal
Ventricles:            Appears normal         Ductal Arch:            Appears normal
Choroid Plexus:        Appears normal         Diaphragm:              Appears normal
Cerebellum:            Appears normal         Stomach:                Appears normal, left
sided
Posterior Fossa:       Appears normal         Abdomen:                Appears normal
Nuchal Fold:           Appears normal         Abdominal Wall:         Appears nml (cord
insert, abd wall)
Face:                  Orbits appear          Cord Vessels:           Appears normal (3
normal                                         vessel cord)
Lips:                  Appears normal         Kidneys:                Appear normal
Palate:                Not well visualized    Bladder:                Appears normal
Thoracic:              Appears normal         Spine:                  Appears normal
Heart:                 Appears normal         Upper Extremities:      Visualized
(4CH, axis, and situs
RVOT:                  Not well visualized    Lower Extremities:      Visualized

Other:  Parents do not wish to know sex of fetus. Heels visualized.
Technically difficult due to fetal position.
Cervix Uterus Adnexa

Cervix
Length:           4.19  cm.
Normal appearance by transabdominal scan.

Left Ovary
Within normal limits.

Right Ovary
Within normal limits.

Adnexa
No abnormality visualized.
Impression

We performed fetal anatomy scan. No makers of
aneuploidies or fetal structural defects are seen. Fetal
biometry is consistent with her previously-established dates.
Amniotic fluid is normal and good fetal activity is seen.
Placenta is anterior and the placental edge is 7 millimeters
from the internal os (low-lying placenta). Patient does not
have history of vaginal bleeding. We reassured her that low-
lying placenta resolves with advancing gestation.
Patient had opted not to screen for fetal aneuploidies.
Recommendations

An appointment was made for her to return in 4 weeks for
completion of fetal anatomy and to assess placental location.

## 2020-02-23 ENCOUNTER — Encounter: Payer: 59 | Admitting: Licensed Clinical Social Worker

## 2020-03-08 ENCOUNTER — Encounter: Payer: 59 | Admitting: Licensed Clinical Social Worker

## 2020-03-09 ENCOUNTER — Encounter: Payer: 59 | Admitting: Licensed Clinical Social Worker

## 2020-03-22 ENCOUNTER — Ambulatory Visit (INDEPENDENT_AMBULATORY_CARE_PROVIDER_SITE_OTHER): Payer: 59 | Admitting: Licensed Clinical Social Worker

## 2020-03-22 DIAGNOSIS — F4321 Adjustment disorder with depressed mood: Secondary | ICD-10-CM

## 2020-03-24 NOTE — BH Specialist Note (Signed)
Integrated Behavioral Health via Telemedicine Visit  03/24/2020 Valerie Taylor [id number]  Number of Integrated Behavioral Health visits: 1/6 Session Start time: 1:06pm  Session End time: 1:37pm Total time: 30 mins via phone due to Valerie connectivity issues. Valerie at home, Valerie located at [facility]   Referring Provider: Austin Miles MD  Patient/Family location: Home  [facility] Provider location: [facility]  All persons participating in visit: Valerie Taylor and Valerie Taylor  Types of Service: General Behavioral Integrated Care (BHI)  I connected with Valerie Taylor and/or Valerie Taylor's n/a by Telephone  (Video is Caregility application) and verified that I am speaking with the correct person using two identifiers.Discussed confidentiality: Yes   I discussed the limitations of telemedicine and the availability of in person appointments.  Discussed there is a possibility of technology failure and discussed alternative modes of communication if that failure occurs.  I discussed that engaging in this telemedicine visit, they consent to the provision of behavioral healthcare and the services will be billed under their insurance.  Patient and/or legal guardian expressed understanding and consented to Telemedicine visit: Yes   Presenting Concerns: Patient and/or family reports the following symptoms/concerns: stressed, work/life balance, lack of support Duration of problem: approximately one month ; Severity of problem: mild  Patient and/or Family's Strengths/Protective Factors: Parental resilience. 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    Goals Addressed: Patient will: 1.  Reduce symptoms of: stress  2.  Increase knowledge and/or ability of: stress reduction  3.  Demonstrate ability to: Increase adequate support systems for patient/family  Progress towards Goals: Ongoing  Interventions: Interventions utilized:   Solution-Focused Strategies Standardized Assessments completed: Not Needed     Assessment: Patient currently experiencing adjustment disorder with depressed mood. Ms. Bernardi reports feeling stressed and overwhelmed regarding returning to work. Ms. Radabaugh reports her current employer does not work schedule flexibility. Ms. Goodhart reports she is experiencing childcare difficulties and resigning is in the best interest of her newborn and family.     Plan: 1. Follow up with behavioral health clinician on : prn 2. Behavioral recommendations: delegate task to burnout, increase rest to boost mood, communicate needs to spouse to develop parenting partnership 3. Referral(s): Education administrator for childcare assistance   I discussed the assessment and treatment plan with the patient and/or parent/guardian. They were provided an opportunity to ask questions and all were answered. They agreed with the plan and demonstrated an understanding of the instructions.   They were advised to call back or seek an in-person evaluation if the symptoms worsen or if the condition fails to improve as anticipated.  Gwyndolyn Saxon, LCSW

## 2020-04-11 IMAGING — US US MFM OB FOLLOW-UP
1 series · 14 of 28 positions shown · non-contrast
Comparison: none

[Series 1: us mfm ob follow-up · 14 of 32 slices shown]
[im 2/32]
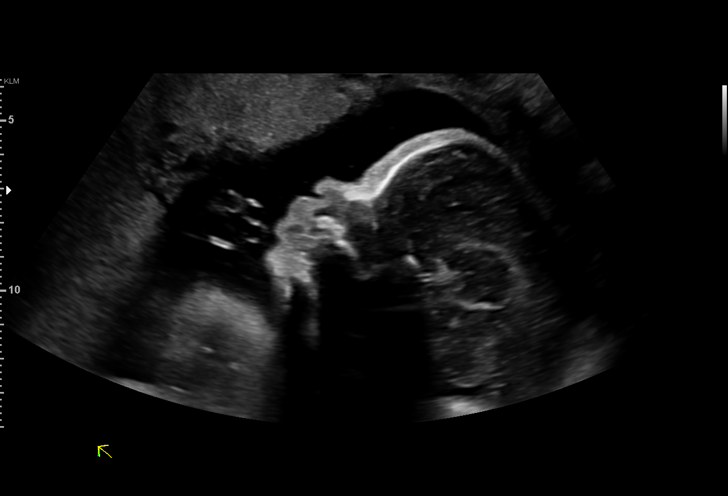
[im 4/32]
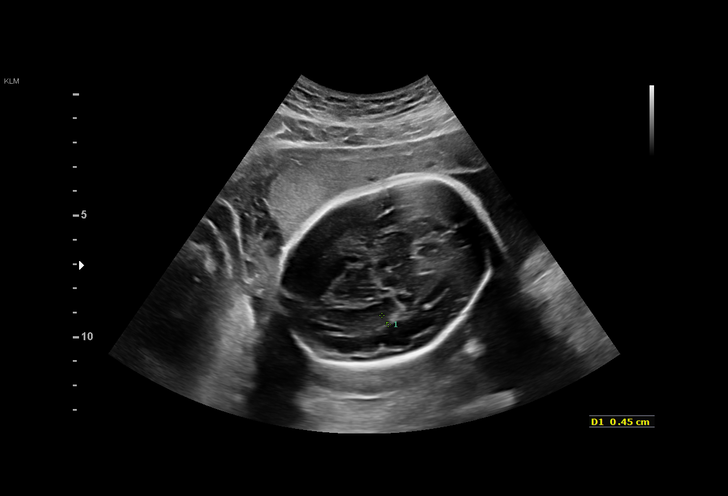
[im 6/32]
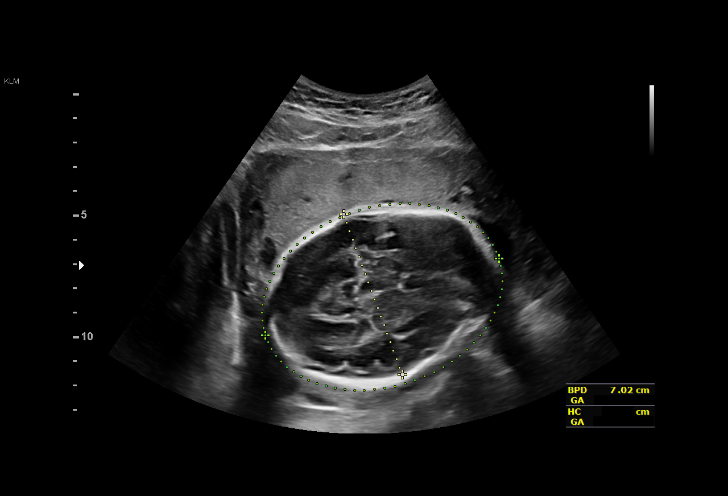
[im 9/32]
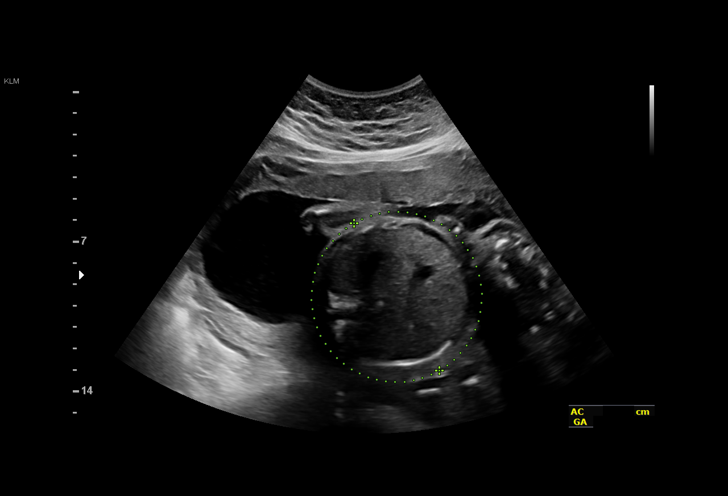
[im 11/32]
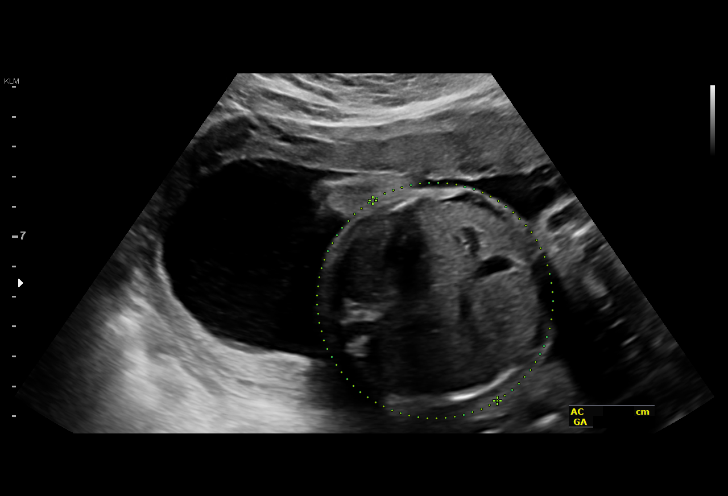
[im 13/32]
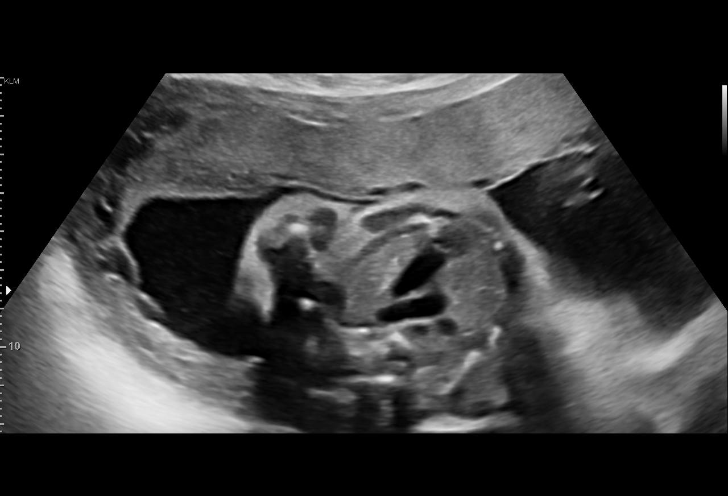
[im 15/32]
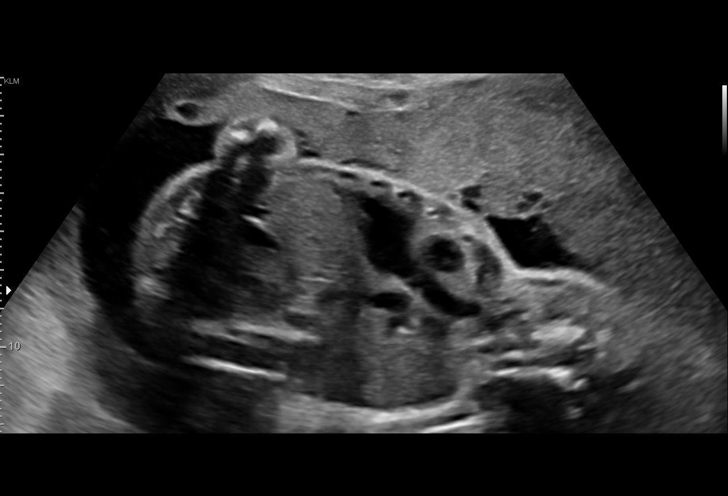
[im 18/32]
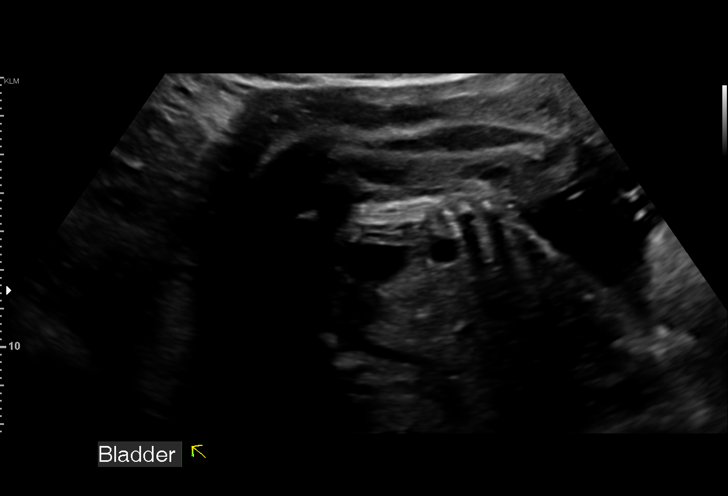
[im 20/32]
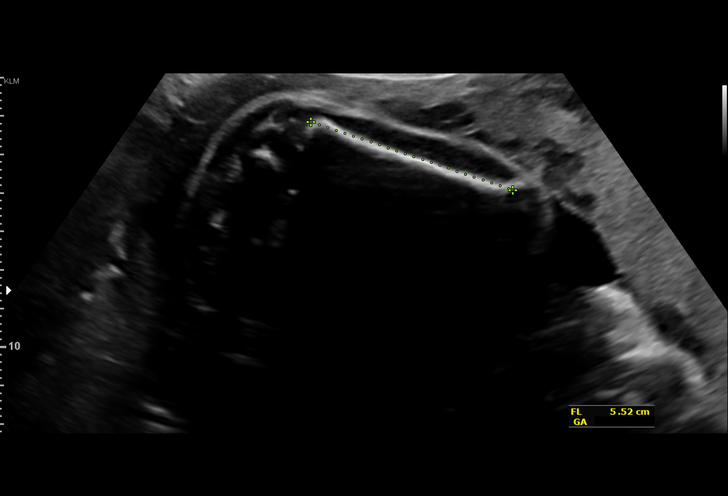
[im 22/32]
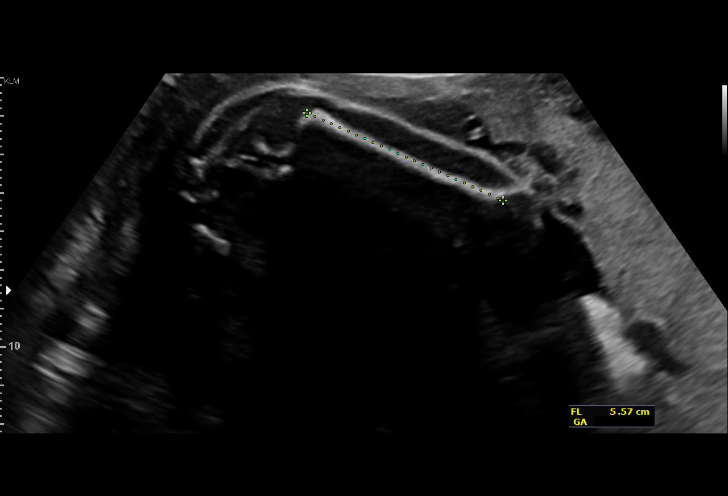
[im 25/32]
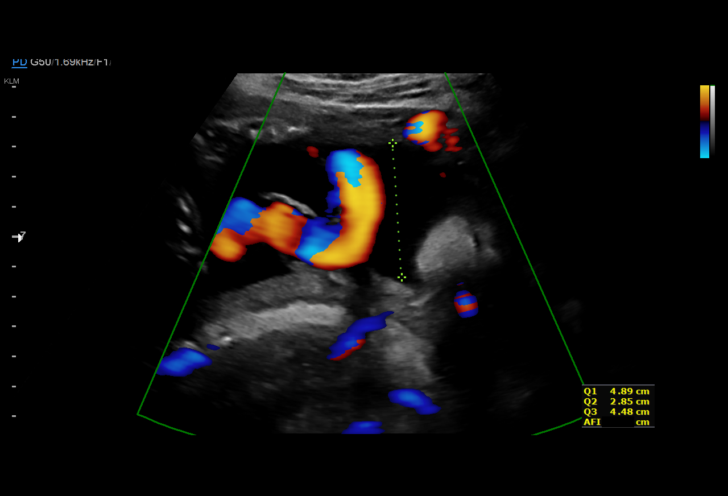
[im 27/32]
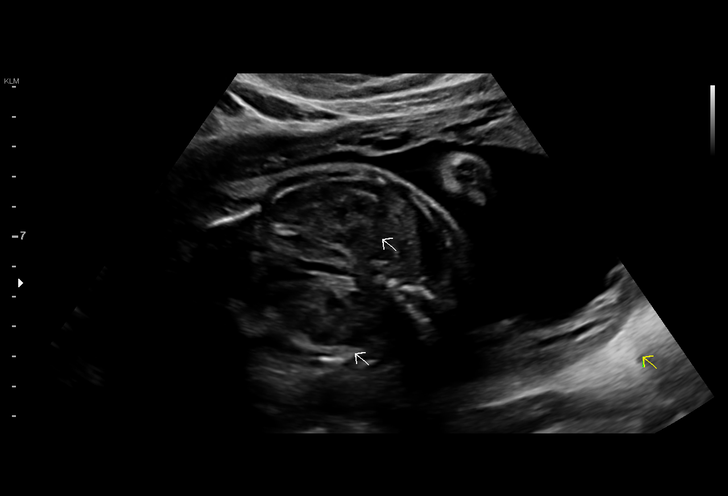
[im 29/32]
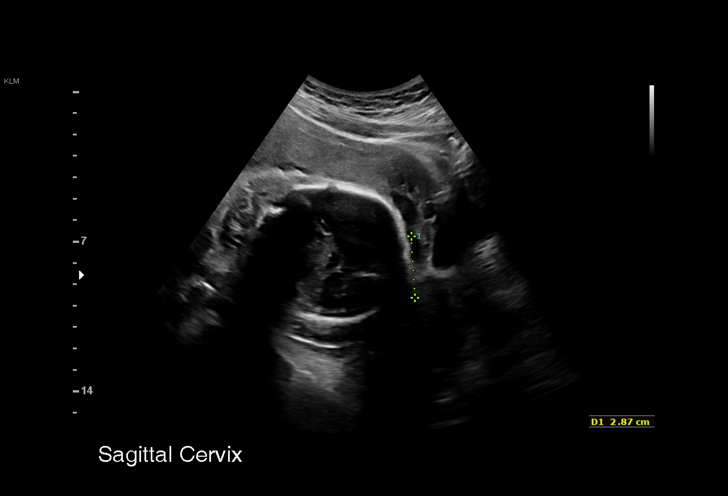
[im 32/32]
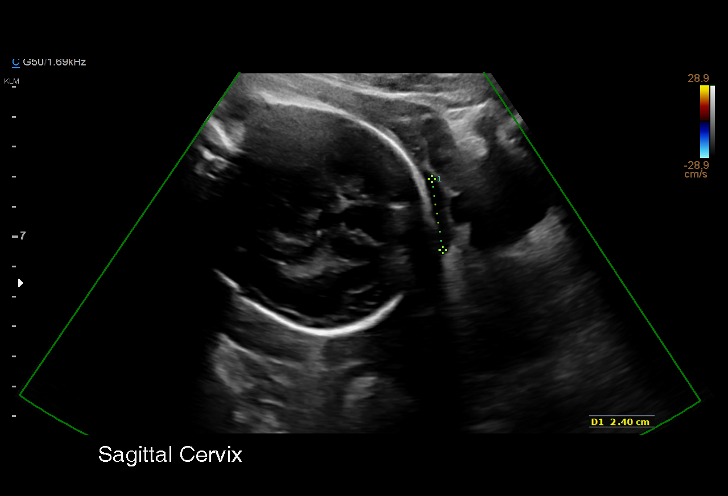

[14 of 28 positions shown; findings below may reference images not displayed]

[REDACTED]care -
                                                             [REDACTED]
 Ref. Address:     Faculty

 ----------------------------------------------------------------------

 ----------------------------------------------------------------------
Indications

  Low lying placenta, antepartum
  28 weeks gestation of pregnancy
  Poor obstetric history: Previous midtrimester
  loss (@ 16 weeks)
 ----------------------------------------------------------------------
Fetal Evaluation

 Num Of Fetuses:          1
 Fetal Heart Rate(bpm):   126
 Cardiac Activity:        Observed
 Presentation:            Cephalic
 Placenta:                Anterior
 P. Cord Insertion:       Visualized

 Amniotic Fluid
 AFI FV:      Within normal limits

 AFI Sum(cm)     %Tile       Largest Pocket(cm)
 14.5            49

 RUQ(cm)       RLQ(cm)       LUQ(cm)        LLQ(cm)

Biometry

 BPD:      69.5  mm     G. Age:  27w 6d         36  %    CI:          69.2  %    70 - 86
                                                         FL/HC:       20.8  %    18.8 -
 HC:      266.8  mm     G. Age:  29w 1d         53  %    HC/AC:       1.07       1.05 -
 AC:      248.9  mm     G. Age:  29w 1d         76  %    FL/BPD:      79.9  %    71 - 87
 FL:       55.5  mm     G. Age:  29w 2d         71  %    FL/AC:       22.3  %    20 - 24

 Est. FW:    1444   gm   2 lb 15 oz      73  %
OB History

 Gravidity:    3         Term:   0        Prem:   0        SAB:   1
 TOP:          0       Ectopic:  1        Living: 0
Gestational Age

 LMP:           28w 0d        Date:  09/17/17                 EDD:   06/24/18
 U/S Today:     28w 6d                                        EDD:   06/18/18
 Best:          28w 0d     Det. By:  LMP  (09/17/17)          EDD:   06/24/18
Anatomy

 Cranium:               Appears normal         Aortic Arch:            Previously seen
 Cavum:                 Previously seen        Ductal Arch:            Previously seen
 Ventricles:            Appears normal         Diaphragm:              Previously seen
 Choroid Plexus:        Previously seen        Stomach:                Appears normal, left
                                                                       sided
 Cerebellum:            Previously seen        Abdomen:                Appears normal
 Posterior Fossa:       Previously seen        Abdominal Wall:         Previously seen
 Nuchal Fold:           Not applicable (>20    Cord Vessels:           Previously seen
                        wks GA)
 Face:                  Orbits and profile     Kidneys:                Appear normal
                        previously seen
 Lips:                  Previously seen        Bladder:                Appears normal
 Thoracic:              Appears normal         Spine:                  Previously seen
 Heart:                 Previously seen        Upper Extremities:      Previously seen
 RVOT:                  Appears normal         Lower Extremities:      Previously seen
 LVOT:                  Appears normal

 Other:  Heels prev visualized. Technically difficult due to fetal position.
         Female gender
Cervix Uterus Adnexa

 Cervix
 Length:            3.4  cm.
 Normal appearance by transabdominal scan.
Impression

 Normal interval growth.
 Low lying placenta resolved.
Recommendations

 Follow up as clinically indicated.

## 2020-06-02 ENCOUNTER — Encounter: Payer: Self-pay | Admitting: Family Medicine

## 2020-06-02 ENCOUNTER — Telehealth: Payer: Medicaid Other | Admitting: Family Medicine

## 2020-06-02 DIAGNOSIS — B372 Candidiasis of skin and nail: Secondary | ICD-10-CM | POA: Insufficient documentation

## 2020-06-02 HISTORY — DX: Candidiasis of skin and nail: B37.2

## 2020-06-02 MED ORDER — LACTINEX PO CHEW: 1.0000 | CHEWABLE_TABLET | Freq: Three times a day (TID) | ORAL | 0 refills | Status: DC

## 2020-06-02 NOTE — Progress Notes (Signed)
Ms. asjah, rauda are scheduled for a virtual visit with your provider today.    Just as we do with appointments in the office, we must obtain your consent to participate.  Your consent will be active for this visit and any virtual visit you may have with one of our providers in the next 365 days.    If you have a MyChart account, I can also send a copy of this consent to you electronically.  All virtual visits are billed to your insurance company just like a traditional visit in the office.  As this is a virtual visit, video technology does not allow for your provider to perform a traditional examination.  This may limit your provider's ability to fully assess your condition.  If your provider identifies any concerns that need to be evaluated in person or the need to arrange testing such as labs, EKG, etc, we will make arrangements to do so.    Although advances in technology are sophisticated, we cannot ensure that it will always work on either your end or our end.  If the connection with a video visit is poor, we may have to switch to a telephone visit.  With either a video or telephone visit, we are not always able to ensure that we have a secure connection.   I need to obtain your verbal consent now.   Are you willing to proceed with your visit today?   Valerie Taylor has provided verbal consent on 06/02/2020 for a virtual visit (video or telephone).   Freddy Finner, NP 06/02/2020  12:43 PM   Date:  06/02/2020   ID:  Valerie Taylor, DOB [id number], MRN [id number]  Patient Location: Home Provider Location: Home Office   Participants: Patient and Provider for Visit and Wrap up  Method of visit: Video  Location of Patient: Home Location of Provider: Home Office Consent was obtain for visit over the video. Services rendered by provider: Visit was performed via video  A video enabled telemedicine application was used and I verified that I am speaking with the correct person using two  identifiers.  PCP:  Patient, No Pcp Per   Chief Complaint:  Infection- yeast or bacteria  History of Present Illness:    Valerie Taylor is a 35 y.o. female with history as stated below. Presents video telehealth for an acute care visit secondary to signs of a possible yeast or bacteria infection starting. Reports today that when she eats a lot of seafood she gets a yeast infection. When she uses scent products it happens as well or if partner does it will cause them. She recently had intercourse- not a new partner, but did not use protection.  She has a hx of yeast and bacteria infections.  Reports no discharge now- but yesterday she reports a odor ("fishy") Did have discharge two days before that which was white and clumpy. Denies itching and burning. Denies pressure and pain in pelvis.  Denies fevers and chills. No changes on bowel or bladder habits.  Of note: chart reports pt is pregnant- but she reports she is not.  Past Medical, Surgical, Social History, Allergies, and Medications have been Reviewed.  Past Medical History:  Diagnosis Date  . Headache   . History of ectopic pregnancy 12/06/2017  . History of pregnancy loss in prior pregnancy, currently pregnant 12/06/2017   4 months loss- patient unclear of events Records requested  . Infection    UTI  . Kidney stones     No  outpatient medications have been marked as taking for the 06/02/20 encounter (Video Visit) with Freddy Finner, NP.     Allergies:   Patient has no known allergies.   ROS See HPI for history of present illness.  Physical Exam Constitutional:      Appearance: Normal appearance.  HENT:     Head: Normocephalic.     Right Ear: External ear normal.     Left Ear: External ear normal.     Nose: Nose normal.  Eyes:     Conjunctiva/sclera: Conjunctivae normal.  Musculoskeletal:        General: Normal range of motion.     Cervical back: Normal range of motion.  Neurological:     Mental Status: She is  alert and oriented to person, place, and time.  Psychiatric:        Mood and Affect: Mood normal.        Behavior: Behavior normal.        Thought Content: Thought content normal.        Judgment: Judgment normal.               A&P  1. Yeast dermatitis -S&S could be yeast- but diffs include bacteria of some kind and she would benefit from having a vaginal swab for the best treatment measures given the odor and discharge and her hx.  -advised to call PCP to get this set up. -refill of lacto medication which she reports helps a lot in preventing these.    - lactobacillus acidophilus & bulgar (LACTINEX) chewable tablet; Chew 1 tablet by mouth 3 (three) times daily with meals.  Dispense: 180 tablet; Refill: 0    Time:   Today, I have spent 10 minutes with the patient with telehealth technology discussing the above problems, reviewing the chart, previous notes, medications and orders.    Tests Ordered: No orders of the defined types were placed in this encounter.   Medication Changes: Meds ordered this encounter  Medications  . lactobacillus acidophilus & bulgar (LACTINEX) chewable tablet    Sig: Chew 1 tablet by mouth 3 (three) times daily with meals.    Dispense:  180 tablet    Refill:  0    Order Specific Question:   Supervising Provider    Answer:   Kerri Perches [[id number]]     Disposition:  Follow up w pcp Signed, Freddy Finner, NP  06/02/2020 12:43 PM

## 2020-06-02 NOTE — Patient Instructions (Signed)
Please follow up with PCP to get Vaginal Swab for best treatment needs

## 2020-06-10 ENCOUNTER — Ambulatory Visit (INDEPENDENT_AMBULATORY_CARE_PROVIDER_SITE_OTHER): Payer: BC Managed Care – PPO

## 2020-06-10 ENCOUNTER — Other Ambulatory Visit (HOSPITAL_COMMUNITY)
Admission: RE | Admit: 2020-06-10 | Discharge: 2020-06-10 | Disposition: A | Discharge status: Home / Self Care | Payer: BC Managed Care – PPO | Source: Ambulatory Visit | Attending: Obstetrics | Admitting: Obstetrics

## 2020-06-10 ENCOUNTER — Other Ambulatory Visit: Payer: Self-pay

## 2020-06-10 VITALS — Wt 179.0 lb

## 2020-06-10 DIAGNOSIS — N898 Other specified noninflammatory disorders of vagina: Secondary | ICD-10-CM | POA: Insufficient documentation

## 2020-06-10 NOTE — Progress Notes (Addendum)
SUBJECTIVE:  35 y.o. female complains of vaginal odor for 5 day(s). Denies abnormal vaginal bleeding or significant pelvic pain or fever. No UTI symptoms. Denies history of known exposure to STD.  Patient's last menstrual period was 11/28/2019 (exact date).  OBJECTIVE:  She appears well, afebrile. Urine dipstick: not done.  ASSESSMENT:  Vaginal Odor   PLAN:  GC, chlamydia, trichomonas, BVAG, CVAG probe sent to lab. Treatment: To be determined once lab results are received ROV prn if symptoms persist or worsen.  Patient was assessed and managed by nursing staff during this encounter. I have reviewed the chart and agree with the documentation and plan. I have also made any necessary editorial changes.  Coral Ceo, MD 06/10/2020 2:59 PM

## 2020-06-11 LAB — CERVICOVAGINAL ANCILLARY ONLY
Bacterial Vaginitis (gardnerella): POSITIVE — AB
Candida Glabrata: NEGATIVE
Candida Vaginitis: NEGATIVE
Chlamydia: NEGATIVE
Comment: NEGATIVE
Comment: NEGATIVE
Comment: NEGATIVE
Comment: NEGATIVE
Comment: NEGATIVE
Comment: NORMAL
Neisseria Gonorrhea: NEGATIVE
Trichomonas: NEGATIVE

## 2020-06-15 ENCOUNTER — Other Ambulatory Visit: Payer: Self-pay | Admitting: Obstetrics

## 2020-06-15 DIAGNOSIS — B9689 Other specified bacterial agents as the cause of diseases classified elsewhere: Secondary | ICD-10-CM

## 2020-06-15 MED ORDER — METRONIDAZOLE 500 MG PO TABS: 500.0000 mg | ORAL_TABLET | Freq: Two times a day (BID) | ORAL | 2 refills | Status: DC

## 2020-07-22 ENCOUNTER — Emergency Department (HOSPITAL_COMMUNITY)
Admission: EM | Admit: 2020-07-22 | Discharge: 2020-07-22 | Disposition: A | Discharge status: Home / Self Care | Payer: Medicaid Other | Attending: Emergency Medicine | Admitting: Emergency Medicine

## 2020-07-22 ENCOUNTER — Encounter (HOSPITAL_COMMUNITY): Payer: Self-pay

## 2020-07-22 ENCOUNTER — Emergency Department (HOSPITAL_BASED_OUTPATIENT_CLINIC_OR_DEPARTMENT_OTHER)
Admit: 2020-07-22 | Discharge: 2020-07-22 | Disposition: A | Discharge status: Home / Self Care | Payer: Medicaid Other | Attending: Emergency Medicine | Admitting: Emergency Medicine

## 2020-07-22 ENCOUNTER — Other Ambulatory Visit: Payer: Self-pay

## 2020-07-22 DIAGNOSIS — M79604 Pain in right leg: Secondary | ICD-10-CM | POA: Diagnosis not present

## 2020-07-22 DIAGNOSIS — R000 Tachycardia, unspecified: Secondary | ICD-10-CM | POA: Diagnosis not present

## 2020-07-22 DIAGNOSIS — M79651 Pain in right thigh: Secondary | ICD-10-CM | POA: Diagnosis present

## 2020-07-22 MED ORDER — KETOROLAC TROMETHAMINE 60 MG/2ML IM SOLN
60.0000 mg | Freq: Once | INTRAMUSCULAR | Status: AC
  Administered 2020-07-22: 60 mg via INTRAMUSCULAR
  Filled 2020-07-22: qty 2

## 2020-07-22 NOTE — Discharge Instructions (Addendum)
You came to the emergency department today to be evaluated for your right thigh pain.  Your physical exam was reassuring.  Your ultrasound showed no signs of a blood clot in your leg.   Likely musculoskeletal in nature and should improve with time.  If your pain does not improve please follow-up with your primary care provider or urgent care.  Please take Ibuprofen (Advil, motrin) and Tylenol (acetaminophen) to relieve your pain.    You may take up to 600 MG (3 pills) of normal strength ibuprofen every 8 hours as needed.   You make take tylenol, up to 1,000 mg (two extra strength pills) every 8 hours as needed.   It is safe to take ibuprofen and tylenol at the same time as they work differently.   Do not take more than 3,000 mg tylenol in a 24 hour period (not more than one dose every 8 hours.  Please check all medication labels as many medications such as pain and cold medications may contain tylenol.  Do not drink alcohol while taking these medications.  Do not take other NSAID'S while taking ibuprofen (such as aleve or naproxen).  Please take ibuprofen with food to decrease stomach upset.  You may apply ice 20 minutes at a time to help reduce your pain.  Ice does not help you may try heat in the same manner.  Get help right away if: You have a new injury and your pain is worse or different. You feel numb or you have tingling in the painful area.

## 2020-07-22 NOTE — Progress Notes (Signed)
Right lower extremity venous duplex completed. Refer to "CV Proc" under chart review to view preliminary results.  07/22/2020 10:38 AM Eula Fried., MHA, RVT, RDCS, RDMS

## 2020-07-22 NOTE — ED Triage Notes (Signed)
patient c/o right upper leg pain x 2 days. Patient denies any injury.

## 2020-07-22 NOTE — ED Notes (Signed)
Patient ambulated from triage to room 4, stable gait

## 2020-07-22 NOTE — ED Provider Notes (Signed)
[facility]-EMERGENCY DEPT Provider Note   CSN: [id number] Arrival date & time: 07/22/20  0848     History Chief Complaint  Patient presents with  . Leg Pain    Valerie Taylor is a 35 y.o. female who prsents with a chief complaint of right thigh pain.  Patient reports that pain began on Tuesday 07/20/20.  Pain has progressivly worsened since than.  Patient rates her pain 10/10 on the pain scale.  Denies any radiation of pain.  Patient reports pain is worse with movement of right leg.  Patient denies any previous history of similar pain.  Patient denies any recent falls or traumatic injuries.  Patient reports that she did go hiking this weekend.    Patient denies any numbness or tingling to extremities, weakness of extremities, color change, wound, fevers, chills, swelling to lower extremities.   HPI     Past Medical History:  Diagnosis Date  . Headache   . History of ectopic pregnancy 12/06/2017  . History of pregnancy loss in prior pregnancy, currently pregnant 12/06/2017   4 months loss- patient unclear of events Records requested  . Infection    UTI  . Kidney stones     Patient Active Problem List   Diagnosis Date Noted  . Yeast dermatitis 06/02/2020  . Depression 02/04/2020  . History of unilateral salpingectomy 02/04/2020  . Encounter for well woman exam with routine gynecological exam 12/06/2018  . Screening examination for infectious disease 12/06/2018  . Need for vaccination 12/06/2018    Past Surgical History:  Procedure Laterality Date  . DIAGNOSTIC LAPAROSCOPY WITH REMOVAL OF ECTOPIC PREGNANCY N/A 01/13/2020   Procedure: DIAGNOSTIC LAPAROSCOPY WITH REMOVAL OF ECTOPIC PREGNANCY AND LEFT SALPINGECTOMY;  Surgeon: Hermina StaggersErvin, Michael L, MD;  Location: [facility] OR;  Service: Gynecology;  Laterality: N/A;  . NO PAST SURGERIES       OB History    Gravida  4   Para  1   Term  1   Preterm      AB  2   Living  1     SAB  1   IAB      Ectopic   1   Multiple  0   Live Births  1        Obstetric Comments  2004 per pt         Family History  Problem Relation Age of Onset  . Healthy Mother   . Healthy Father   . Asthma Sister   . Cancer Maternal Aunt        breast    Social History   Tobacco Use  . Smoking status: Never Smoker  . Smokeless tobacco: Never Used  Vaping Use  . Vaping Use: Never used  Substance Use Topics  . Alcohol use: Never  . Drug use: Never    Home Medications Prior to Admission medications   Medication Sig Start Date End Date Taking? Authorizing Provider  escitalopram (LEXAPRO) 10 MG tablet Take 1 tablet (10 mg total) by mouth daily. 02/04/20   Hermina StaggersErvin, Michael L, MD  lactobacillus acidophilus & bulgar (LACTINEX) chewable tablet Chew 1 tablet by mouth 3 (three) times daily with meals. 06/02/20 08/31/20  Freddy FinnerMills, Hannah M, NP  metroNIDAZOLE (FLAGYL) 500 MG tablet Take 1 tablet (500 mg total) by mouth 2 (two) times daily. 06/15/20   Brock BadHarper, Charles A, MD    Allergies    Patient has no known allergies.  Review of Systems   Review of Systems  Constitutional: Negative for chills and fever.  Musculoskeletal: Positive for myalgias. Negative for back pain, joint swelling and neck pain.  Skin: Negative for color change, pallor, rash and wound.  Neurological: Negative for weakness and numbness.    Physical Exam Updated Vital Signs BP 131/74 (BP Location: Right Arm)   Pulse (!) 108   Temp 99.1 F (37.3 C) (Oral)   Resp 16   Ht 5\' 4"  (1.626 m)   Wt 79.4 kg   LMP 07/03/2019 (Approximate)   SpO2 100%   Breastfeeding Unknown   BMI 30.04 kg/m   Physical Exam Vitals and nursing note reviewed.  Constitutional:      General: She is not in acute distress.    Appearance: She is not ill-appearing, toxic-appearing or diaphoretic.  HENT:     Head: Normocephalic.  Eyes:     General: No scleral icterus.       Right eye: No discharge.        Left eye: No discharge.  Cardiovascular:     Rate and  Rhythm: Tachycardia present.     Comments: Tachycardia at rate of 108 Pulmonary:     Effort: Pulmonary effort is normal. No respiratory distress.     Breath sounds: No stridor.  Musculoskeletal:     Right hip: No deformity, lacerations, tenderness, bony tenderness or crepitus. Normal range of motion. Normal strength.     Left hip: No deformity, lacerations, tenderness, bony tenderness or crepitus. Normal strength.     Right upper leg: Tenderness present. No swelling, edema, deformity, lacerations or bony tenderness.     Left upper leg: No swelling, edema, deformity, lacerations, tenderness or bony tenderness.     Right knee: No swelling, deformity, effusion, erythema, ecchymosis, lacerations, bony tenderness or crepitus. Normal range of motion. No tenderness. Normal alignment.     Left knee: No swelling, deformity, effusion, erythema, ecchymosis, lacerations, bony tenderness or crepitus. Normal range of motion. No tenderness. Normal alignment.     Right lower leg: No swelling, deformity, lacerations, tenderness or bony tenderness. No edema.     Left lower leg: No swelling, deformity, lacerations, tenderness or bony tenderness. No edema.     Right ankle: No swelling, deformity, ecchymosis or lacerations. No tenderness. Normal range of motion.     Left ankle: No swelling, deformity, ecchymosis or lacerations. No tenderness. Normal range of motion.     Right foot: Normal range of motion. No swelling, deformity, laceration, tenderness or bony tenderness.     Left foot: Normal range of motion. No swelling, deformity, laceration, tenderness or bony tenderness.  Skin:    General: Skin is warm and dry.  Neurological:     General: No focal deficit present.     Mental Status: She is alert.  Psychiatric:        Behavior: Behavior is cooperative.     ED Results / Procedures / Treatments   Labs (all labs ordered are listed, but only abnormal results are displayed) Labs Reviewed - No data to  display  EKG None  Radiology VAS 09/02/2019 LOWER EXTREMITY VENOUS (DVT) (ONLY [facility] & [facility])  Result Date: 07/22/2020  Lower Venous DVT Study Indications: Right anterior thigh pain.  Comparison Study: No prior study Performing Technologist: 07/24/2020 MHA, RDMS, RVT, RDCS  Examination Guidelines: A complete evaluation includes B-mode imaging, spectral Doppler, color Doppler, and power Doppler as needed of all accessible portions of each vessel. Bilateral testing is considered an integral part of a complete examination. Limited examinations for reoccurring  indications may be performed as noted. The reflux portion of the exam is performed with the patient in reverse Trendelenburg.  +---------+---------------+---------+-----------+----------+--------------+ RIGHT    CompressibilityPhasicitySpontaneityPropertiesThrombus Aging +---------+---------------+---------+-----------+----------+--------------+ CFV      Full           Yes      Yes                                 +---------+---------------+---------+-----------+----------+--------------+ SFJ      Full                                                        +---------+---------------+---------+-----------+----------+--------------+ FV Prox  Full                                                        +---------+---------------+---------+-----------+----------+--------------+ FV Mid   Full                                                        +---------+---------------+---------+-----------+----------+--------------+ FV DistalFull                                                        +---------+---------------+---------+-----------+----------+--------------+ PFV      Full                                                        +---------+---------------+---------+-----------+----------+--------------+ POP      Full           Yes      Yes                                  +---------+---------------+---------+-----------+----------+--------------+ PTV      Full                                                        +---------+---------------+---------+-----------+----------+--------------+ PERO     Full                                                        +---------+---------------+---------+-----------+----------+--------------+  +----+---------------+---------+-----------+----------+--------------+ LEFTCompressibilityPhasicitySpontaneityPropertiesThrombus Aging +----+---------------+---------+-----------+----------+--------------+ CFV Full           Yes      Yes                                 +----+---------------+---------+-----------+----------+--------------+  Summary: RIGHT: - There is no evidence of deep vein thrombosis in the lower extremity.  - No cystic structure found in the popliteal fossa.  LEFT: - No evidence of common femoral vein obstruction.  *See table(s) above for measurements and observations. Electronically signed by Coral Else MD on 07/22/2020 at 9:28:05 PM.    Final     Procedures Procedures   Medications Ordered in ED Medications  ketorolac (TORADOL) injection 60 mg (60 mg Intramuscular Given 07/22/20 0955)    ED Course  I have reviewed the triage vital signs and the nursing notes.  Pertinent labs & imaging results that were available during my care of the patient were reviewed by me and considered in my medical decision making (see chart for details).    MDM Rules/Calculators/A&P                          Alert 35 year old female no acute distress, nontoxic-appearing.  Patient presents with chief complaint of right thigh pain.  Pain began on Tuesday and has progressively worsened since then.  Patient endorses increased pain with movement of right lower extremity.  Patient denies any recent falls or injuries.  Patient endorses she did go hiking this weekend.  Patient denies any swelling, numbness or  tingling, weakness, or color change to her right lower extremity.  On physical exam patient has full range of motion of right hip, right knee, and right ankle.  Patient has no swelling or erythema noted to right lower extremity.  Patient has no bony tenderness to right lower extremity.    Will obtain DVT study.   Study shows no evidence of deep vein thrombosis in the right lower extremity, no cystic structure from the popliteal fossa, no evidence of common femoral vein obstruction to left lower extremity.  Patient's symptoms likely musculoskeletal in nature.  Patient given Toradol for her pain management.  Patient advised to rest, ice, and use over-the-counter pain medication as needed to reduce symptoms.  Discussed results, findings, treatment and follow up. Patient advised of return precautions. Patient verbalized understanding and agreed with plan.   Final Clinical Impression(s) / ED Diagnoses Final diagnoses:  Right leg pain    Rx / DC Orders ED Discharge Orders    None       Berneice Heinrich 07/22/20 2236    Lorre Nick, MD 07/27/20 [phone]

## 2020-09-13 ENCOUNTER — Other Ambulatory Visit (HOSPITAL_COMMUNITY)
Admission: RE | Admit: 2020-09-13 | Discharge: 2020-09-13 | Disposition: A | Discharge status: Home / Self Care | Payer: BC Managed Care – PPO | Source: Ambulatory Visit | Attending: Obstetrics and Gynecology | Admitting: Obstetrics and Gynecology

## 2020-09-13 ENCOUNTER — Other Ambulatory Visit: Payer: Self-pay

## 2020-09-13 ENCOUNTER — Ambulatory Visit (INDEPENDENT_AMBULATORY_CARE_PROVIDER_SITE_OTHER): Payer: BC Managed Care – PPO | Admitting: *Deleted

## 2020-09-13 DIAGNOSIS — N760 Acute vaginitis: Secondary | ICD-10-CM | POA: Insufficient documentation

## 2020-09-13 NOTE — Progress Notes (Signed)
SUBJECTIVE:  35 y.o. female complains of white and thick vaginal discharge for 2-3 day(s). Denies abnormal vaginal bleeding or significant pelvic pain or fever. No UTI symptoms. Denies history of known exposure to STD.   OBJECTIVE:  She appears well, afebrile. Urine dipstick: not done.  ASSESSMENT:  Vaginal Discharge  Vaginal Odor   PLAN:  BVAG, CVAG probe sent to lab. Treatment: To be determined once lab results are received ROV prn if symptoms persist or worsen.  Pt request cream/gel treatment if needed.  Pt made aware she may receive results prior to provider review.  Advised she may call office for any positive results.

## 2020-09-13 NOTE — Progress Notes (Signed)
Agree with A & P. 

## 2020-09-14 ENCOUNTER — Other Ambulatory Visit: Payer: Self-pay

## 2020-09-14 DIAGNOSIS — B9689 Other specified bacterial agents as the cause of diseases classified elsewhere: Secondary | ICD-10-CM

## 2020-09-14 DIAGNOSIS — N760 Acute vaginitis: Secondary | ICD-10-CM

## 2020-09-14 DIAGNOSIS — B379 Candidiasis, unspecified: Secondary | ICD-10-CM

## 2020-09-14 LAB — CERVICOVAGINAL ANCILLARY ONLY
Bacterial Vaginitis (gardnerella): POSITIVE — AB
Candida Glabrata: NEGATIVE
Candida Vaginitis: POSITIVE — AB
Comment: NEGATIVE
Comment: NEGATIVE
Comment: NEGATIVE

## 2020-09-14 MED ORDER — METRONIDAZOLE 0.75 % VA GEL: 1.0000 | Freq: Every day | VAGINAL | 1 refills | Status: DC

## 2020-09-14 MED ORDER — FLUCONAZOLE 150 MG PO TABS: 150.0000 mg | ORAL_TABLET | Freq: Once | ORAL | 0 refills | Status: AC

## 2020-10-11 ENCOUNTER — Telehealth: Payer: BC Managed Care – PPO | Admitting: Physician Assistant

## 2020-10-11 DIAGNOSIS — B3731 Acute candidiasis of vulva and vagina: Secondary | ICD-10-CM

## 2020-10-11 DIAGNOSIS — B373 Candidiasis of vulva and vagina: Secondary | ICD-10-CM

## 2020-10-11 MED ORDER — FLUCONAZOLE 150 MG PO TABS: 150.0000 mg | ORAL_TABLET | Freq: Once | ORAL | 0 refills | Status: AC

## 2020-10-11 NOTE — Progress Notes (Signed)

## 2020-11-30 ENCOUNTER — Ambulatory Visit (INDEPENDENT_AMBULATORY_CARE_PROVIDER_SITE_OTHER): Payer: BC Managed Care – PPO

## 2020-11-30 ENCOUNTER — Other Ambulatory Visit: Payer: Self-pay

## 2020-11-30 VITALS — BP 112/78 | HR 75

## 2020-11-30 DIAGNOSIS — Z3201 Encounter for pregnancy test, result positive: Secondary | ICD-10-CM

## 2020-11-30 DIAGNOSIS — Z3200 Encounter for pregnancy test, result unknown: Secondary | ICD-10-CM

## 2020-11-30 LAB — POCT URINE PREGNANCY: Preg Test, Ur: POSITIVE — AB

## 2020-11-30 NOTE — Progress Notes (Signed)
Ms. Dino presents today for UPT. She has no unusual complaints. LMP: 10/22/2020    OBJECTIVE: Appears well, in no apparent distress.  OB History     Gravida  4   Para  1   Term  1   Preterm      AB  2   Living  1      SAB  1   IAB      Ectopic  1   Multiple  0   Live Births  1        Obstetric Comments  2004 per pt         Home UPT Result: POS In-Office UPT result: POS I have reviewed the patient's medical, obstetrical, social, and family histories, and medications.   ASSESSMENT: Positive pregnancy test  PLAN Prenatal care to be completed at: Will start at [facility] around 10 weeks.

## 2020-11-30 NOTE — Progress Notes (Signed)
Patient was assessed and managed by nursing staff during this encounter. I have reviewed the chart and agree with the documentation and plan. I have also made any necessary editorial changes.  Catalina Antigua, MD 11/30/2020 9:03 AM

## 2020-12-29 ENCOUNTER — Other Ambulatory Visit: Payer: Self-pay

## 2020-12-29 ENCOUNTER — Ambulatory Visit (INDEPENDENT_AMBULATORY_CARE_PROVIDER_SITE_OTHER): Payer: BC Managed Care – PPO

## 2020-12-29 ENCOUNTER — Ambulatory Visit (INDEPENDENT_AMBULATORY_CARE_PROVIDER_SITE_OTHER): Payer: Medicaid Other

## 2020-12-29 VITALS — BP 117/70 | HR 87 | Ht 64.0 in | Wt 186.2 lb

## 2020-12-29 DIAGNOSIS — O3680X0 Pregnancy with inconclusive fetal viability, not applicable or unspecified: Secondary | ICD-10-CM

## 2020-12-29 DIAGNOSIS — Z3481 Encounter for supervision of other normal pregnancy, first trimester: Secondary | ICD-10-CM

## 2020-12-29 DIAGNOSIS — Z3A10 10 weeks gestation of pregnancy: Secondary | ICD-10-CM | POA: Diagnosis not present

## 2020-12-29 DIAGNOSIS — Z3480 Encounter for supervision of other normal pregnancy, unspecified trimester: Secondary | ICD-10-CM | POA: Insufficient documentation

## 2020-12-29 DIAGNOSIS — Z3491 Encounter for supervision of normal pregnancy, unspecified, first trimester: Secondary | ICD-10-CM | POA: Insufficient documentation

## 2020-12-29 MED ORDER — BLOOD PRESSURE KIT DEVI: 1.0000 | 0 refills | Status: DC

## 2020-12-29 NOTE — Progress Notes (Signed)
New OB Intake  I connected with  Valerie Taylor on 12/29/20 at  1:15 PM EDT by in person. Video Visit and verified that I am speaking with the correct person using two identifiers. Nurse is located at [facility] and pt is located at [place].  I discussed the limitations, risks, security and privacy concerns of performing an evaluation and management service by telephone and the availability of in person appointments. I also discussed with the patient that there may be a patient responsible charge related to this service. The patient expressed understanding and agreed to proceed.  I explained I am completing New OB Intake today. We discussed her EDD of 07/29/21 that is based on LMP of 10/22/20. Pt is G5/P1031. I reviewed her allergies, medications, Medical/Surgical/OB history, and appropriate screenings. I informed her of [facility] services. Based on history, this is a/an  pregnancy uncomplicated .   Patient Active Problem List   Diagnosis Date Noted   Encounter for supervision of normal pregnancy in first trimester 12/29/2020   Yeast dermatitis 06/02/2020   Depression 02/04/2020   History of unilateral salpingectomy 02/04/2020   Encounter for well woman exam with routine gynecological exam 12/06/2018   Screening examination for infectious disease 12/06/2018   Need for vaccination 12/06/2018    Concerns addressed today  Delivery Plans:  Plans to deliver at [facility].   MyChart/Babyscripts MyChart access verified. I explained pt will have some visits in office and some virtually. Babyscripts instructions given and order placed. Patient verifies receipt of registration text/e-mail. Account successfully created and app downloaded.  Blood Pressure Cuff  Blood pressure cuff ordered for patient to pick-up from [employer]. Explained after first prenatal appt pt will check weekly and document in Babyscripts.  Weight scale: Patient does have weight scale at home.  Anatomy US Explained first  scheduled [place] will be around 19 weeks. Dating and viability scan performed today  Labs Discussed Avelina Laine genetic screening with patient. Would like both Panorama and Horizon drawn at new OB visit. Routine prenatal labs needed.  Covid Vaccine Patient has not covid vaccine.   Mother/ Baby Dyad Candidate?    If yes, offer as possibility  Informed patient of [facility] website  and placed link in her AVS.   Social Determinants of Health Food Insecurity: Patient denies food insecurity. WIC Referral: Patient is not interested in referral to [facility]  Transportation: Patient denies transportation needs. Childcare: Discussed no children allowed at ultrasound appointments. Offered childcare services; patient declines childcare services at this time.  Send link to Pregnancy Navigators   Placed OB Box on problem list and updated  First visit review I reviewed new OB appt with pt. I explained she will have a pelvic exam, ob bloodwork with genetic screening, and PAP smear. Explained pt will be seen by Mariel Aloe at first visit; encounter routed to appropriate provider. Explained that patient will be seen by pregnancy navigator following visit with provider. [employer] [facility] information placed in AVS.   Hamilton Capri, RN 12/29/2020  1:46 PM

## 2020-12-29 NOTE — Progress Notes (Signed)
Agree with A & P. 

## 2021-01-11 ENCOUNTER — Other Ambulatory Visit: Payer: Self-pay

## 2021-01-11 ENCOUNTER — Encounter: Payer: Self-pay | Admitting: Obstetrics & Gynecology

## 2021-01-11 ENCOUNTER — Ambulatory Visit (INDEPENDENT_AMBULATORY_CARE_PROVIDER_SITE_OTHER): Payer: BC Managed Care – PPO | Admitting: Obstetrics & Gynecology

## 2021-01-11 ENCOUNTER — Other Ambulatory Visit (HOSPITAL_COMMUNITY)
Admission: RE | Admit: 2021-01-11 | Discharge: 2021-01-11 | Disposition: A | Discharge status: Home / Self Care | Payer: BC Managed Care – PPO | Source: Ambulatory Visit | Attending: Obstetrics & Gynecology | Admitting: Obstetrics & Gynecology

## 2021-01-11 VITALS — BP 129/75 | HR 96 | Wt 187.4 lb

## 2021-01-11 DIAGNOSIS — O99211 Obesity complicating pregnancy, first trimester: Secondary | ICD-10-CM | POA: Insufficient documentation

## 2021-01-11 DIAGNOSIS — O99210 Obesity complicating pregnancy, unspecified trimester: Secondary | ICD-10-CM

## 2021-01-11 DIAGNOSIS — B9689 Other specified bacterial agents as the cause of diseases classified elsewhere: Secondary | ICD-10-CM | POA: Insufficient documentation

## 2021-01-11 DIAGNOSIS — Z3401 Encounter for supervision of normal first pregnancy, first trimester: Secondary | ICD-10-CM | POA: Diagnosis not present

## 2021-01-11 DIAGNOSIS — N760 Acute vaginitis: Secondary | ICD-10-CM | POA: Diagnosis not present

## 2021-01-11 DIAGNOSIS — Z3A11 11 weeks gestation of pregnancy: Secondary | ICD-10-CM

## 2021-01-11 DIAGNOSIS — O26891 Other specified pregnancy related conditions, first trimester: Secondary | ICD-10-CM | POA: Insufficient documentation

## 2021-01-11 DIAGNOSIS — E669 Obesity, unspecified: Secondary | ICD-10-CM | POA: Insufficient documentation

## 2021-01-11 DIAGNOSIS — Z3480 Encounter for supervision of other normal pregnancy, unspecified trimester: Secondary | ICD-10-CM

## 2021-01-11 DIAGNOSIS — N898 Other specified noninflammatory disorders of vagina: Secondary | ICD-10-CM

## 2021-01-11 MED ORDER — ASPIRIN EC 81 MG PO TBEC: 81.0000 mg | DELAYED_RELEASE_TABLET | Freq: Every day | ORAL | 2 refills | Status: DC

## 2021-01-11 NOTE — Progress Notes (Signed)
History:   Valerie Taylor is a 35 y.o. [id number] at 9w4dby LMP, early ultrasound being seen today for her first obstetrical visit.  Her obstetrical history is significant for  ectopic history s/p salpingectomy in 23382 had uncomplicated term SVD in 25053. Patient does intend to breast feed. Pregnancy history fully reviewed.  Patient reports nausea, medications not requested. No emesis. Also reports having abnormal vaginal discharge, wants evaluation.      HISTORY: OB History  Gravida Para Term Preterm AB Living  '5 1 1 ' 0 3 1  SAB IAB Ectopic Multiple Live Births  1 0 2 0 1    # Outcome Date GA Lbr Len/2nd Weight Sex Delivery Anes PTL Lv  5 Current           4 Ectopic 03/2020          3 Term 06/18/18 [id number] / 01:59 7 lb 12.2 oz (3.52 kg) F Vag-Spont EPI  LIV     Name: Funches,GIRL Ulani     Apgar1: 2  Apgar5: 5  2 Ectopic 2005          1 SAB  [id number]        Obstetric Comments  2004 per pt   Last pap smear was done 2020 and was normal but with positive HRHPV.  Past Medical History:  Diagnosis Date   Headache    History of ectopic pregnancy 12/06/2017   History of pregnancy loss in prior pregnancy, currently pregnant 12/06/2017   4 months loss- patient unclear of events Records requested   Infection    UTI   Kidney stones    Yeast dermatitis 06/02/2020   Past Surgical History:  Procedure Laterality Date   DIAGNOSTIC LAPAROSCOPY WITH REMOVAL OF ECTOPIC PREGNANCY N/A 01/13/2020   Procedure: DIAGNOSTIC LAPAROSCOPY WITH REMOVAL OF ECTOPIC PREGNANCY AND LEFT SALPINGECTOMY;  Surgeon: ErvChancy MilroyD;  [facility];  Laterality: N/A;   NO PAST SURGERIES     Family History  Problem Relation Age of Onset   Healthy Mother    Healthy Father    Asthma Sister    Hypertension Maternal Grandmother    Alzheimer's disease Maternal Grandmother    Cancer Maternal Aunt        breast   Social History   Tobacco Use   Smoking status: Never    Smokeless tobacco: Never  Vaping Use   Vaping Use: Never used  Substance Use Topics   Alcohol use: Never   Drug use: Never   No Known Allergies Current Outpatient Medications on File Prior to Visit  Medication Sig Dispense Refill   Prenatal MV & Min w/FA-DHA (PRENATAL GUMMIES PO) Take by mouth.     Blood Pressure Monitoring (BLOOD PRESSURE KIT) DEVI 1 kit by Does not apply route once a week. (Patient not taking: Reported on 01/11/2021) 1 each 0   No current facility-administered medications on file prior to visit.    Review of Systems Pertinent items noted in HPI and remainder of comprehensive ROS otherwise negative.  Physical Exam:   Vitals:   01/11/21 1520  BP: 129/75  Pulse: 96  Weight: 187 lb 6.4 oz (85 kg)   Fetal Heart Rate (bpm): 160  General: well-developed, well-nourished female in no acute distress  Breasts:  normal appearance, no masses or tenderness bilaterally.  Exam done in the presence of a chaperone.   Skin: normal coloration and turgor, no rashes  Neurologic: oriented, normal, negative,  normal mood  Extremities: normal strength, tone, and muscle mass, ROM of all joints is normal  HEENT PERRLA, extraocular movement intact and sclera clear, anicteric  Neck supple and no masses  Cardiovascular: regular rate and rhythm  Respiratory:  no respiratory distress, normal breath sounds  Abdomen: soft, non-tender; bowel sounds normal; no masses,  no organomegaly  Pelvic: normal external genitalia, no lesions, normal vaginal mucosa, yellow vaginal discharge seen and testing sample obtained, normal cervix, pap smear done. Exam done in the presence of a chaperone.     Assessment:    Pregnancy: [id number] Patient Active Problem List   Diagnosis Date Noted   Supervision of other normal pregnancy, antepartum 12/29/2020   Depression 02/04/2020   History of unilateral salpingectomy 02/04/2020     Plan:    1. Vaginal discharge during pregnancy in first trimester -  Cervicovaginal ancillary only( [place]) done, will follow up results and manage accordingly.  2. Obesity in pregnancy, antepartum Baseline labs obtained, ASA prescribed.  - Hemoglobin A1c - Comprehensive metabolic panel - Protein / creatinine ratio, urine - TSH - [place] MFM OB DETAIL +14 WK; Future - aspirin EC 81 MG tablet; Take 1 tablet (81 mg total) by mouth daily. Take after 12 weeks for prevention of preeclampsia later in pregnancy  Dispense: 300 tablet; Refill: 2  3. [redacted] weeks gestation of pregnancy 4. Supervision of other normal pregnancy, antepartum - Culture, OB Urine - Cytology - PAP( [place]) - CBC/D/Plt+RPR+Rh+ABO+RubIgG... - [place] MFM OB DETAIL +14 WK; Future  Initial labs drawn. Continue prenatal vitamins. Problem list reviewed and updated. Genetic Screening discussed, First trimester screen, Quad screen, and NIPS: declined. Ultrasound discussed; fetal anatomic survey: ordered. Anticipatory guidance about prenatal visits given including labs, ultrasounds, and testing. Encouraged to complete MyChart Registration for her ability to review results, send requests, and have questions addressed.  The nature of [place] for [facility] with multiple MDs and Advanced Practice Providers was explained to patient; also emphasized that residents, students are part of our team. Routine obstetric precautions reviewed. Encouraged to seek out care at office or emergency room [facility] preferred) for urgent and/or emergent concerns. Return in about 4 weeks (around 02/08/2021) for OFFICE OB VISIT (MD or APP).     Verita Schneiders, MD, [place] for [employer], [place]

## 2021-01-11 NOTE — Patient Instructions (Signed)
First Trimester of Pregnancy °The first trimester of pregnancy starts on the first day of your last menstrual period until the end of week 12. This is months 1 through 3 of pregnancy. A week after a sperm fertilizes an egg, the egg will implant into the wall of the uterus and begin to develop into a baby. By the end of 12 weeks, all the baby's organs will be formed and the baby will be 2-3 inches in size. °Body changes during your first trimester °Your body goes through many changes during pregnancy. The changes vary and generally return to normal after your baby is born. °Physical changes °You may gain or lose weight. °Your breasts may begin to grow larger and become tender. The tissue that surrounds your nipples (areola) may become darker. °Dark spots or blotches (chloasma or mask of pregnancy) may develop on your face. °You may have changes in your hair. These can include thickening or thinning of your hair or changes in texture. °Health changes °You may feel nauseous, and you may vomit. °You may have heartburn. °You may develop headaches. °You may develop constipation. °Your gums may bleed and may be sensitive to brushing and flossing. °Other changes °You may tire easily. °You may urinate more often. °Your menstrual periods will stop. °You may have a loss of appetite. °You may develop cravings for certain kinds of food. °You may have changes in your emotions from day to day. °You may have more vivid and strange dreams. °Follow these instructions at home: °Medicines °Follow your health care provider's instructions regarding medicine use. Specific medicines may be either safe or unsafe to take during pregnancy. Do not take any medicines unless told to by your health care provider. °Take a prenatal vitamin that contains at least 600 micrograms (mcg) of folic acid. °Eating and drinking °Eat a healthy diet that includes fresh fruits and vegetables, whole grains, good sources of protein such as meat, eggs, or tofu,  and low-fat dairy products. °Avoid raw meat and unpasteurized juice, milk, and cheese. These carry germs that can harm you and your baby. °If you feel nauseous or you vomit: °Eat 4 or 5 small meals a day instead of 3 large meals. °Try eating a few soda crackers. °Drink liquids between meals instead of during meals. °You may need to take these actions to prevent or treat constipation: °Drink enough fluid to keep your urine pale yellow. °Eat foods that are high in fiber, such as beans, whole grains, and fresh fruits and vegetables. °Limit foods that are high in fat and processed sugars, such as fried or sweet foods. °Activity °Exercise only as directed by your health care provider. Most people can continue their usual exercise routine during pregnancy. Try to exercise for 30 minutes at least 5 days a week. °Stop exercising if you develop pain or cramping in the lower abdomen or lower back. °Avoid exercising if it is very hot or humid or if you are at high altitude. °Avoid heavy lifting. °If you choose to, you may have sex unless your health care provider tells you not to. °Relieving pain and discomfort °Wear a good support bra to relieve breast tenderness. °Rest with your legs elevated if you have leg cramps or low back pain. °If you develop bulging veins (varicose veins) in your legs: °Wear support hose as told by your health care provider. °Elevate your feet for 15 minutes, 3-4 times a day. °Limit salt in your diet. °Safety °Wear your seat belt at all times when driving   or riding in a car. °Talk with your health care provider if someone is verbally or physically abusive to you. °Talk with your health care provider if you are feeling sad or have thoughts of hurting yourself. °Lifestyle °Do not use hot tubs, steam rooms, or saunas. °Do not douche. Do not use tampons or scented sanitary pads. °Do not use herbal remedies, alcohol, illegal drugs, or medicines that are not approved by your health care provider. Chemicals  in these products can harm your baby. °Do not use any products that contain nicotine or tobacco, such as cigarettes, e-cigarettes, and chewing tobacco. If you need help quitting, ask your health care provider. °Avoid cat litter boxes and soil used by cats. These carry germs that can cause birth defects in the baby and possibly loss of the unborn baby (fetus) by miscarriage or stillbirth. °General instructions °During routine prenatal visits in the first trimester, your health care provider will do a physical exam, perform necessary tests, and ask you how things are going. Keep all follow-up visits. This is important. °Ask for help if you have counseling or nutritional needs during pregnancy. Your health care provider can offer advice or refer you to specialists for help with various needs. °Schedule a dentist appointment. At home, brush your teeth with a soft toothbrush. Floss gently. °Write down your questions. Take them to your prenatal visits. °Where to find more information °American Pregnancy Association: americanpregnancy.org °American College of Obstetricians and Gynecologists: acog.org/en/Womens%20Health/Pregnancy °Office on Women's Health: womenshealth.gov/pregnancy °Contact a health care provider if you have: °Dizziness. °A fever. °Mild pelvic cramps, pelvic pressure, or nagging pain in the abdominal area. °Nausea, vomiting, or diarrhea that lasts for 24 hours or longer. °A bad-smelling vaginal discharge. °Pain when you urinate. °Known exposure to a contagious illness, such as chickenpox, measles, Zika virus, HIV, or hepatitis. °Get help right away if you have: °Spotting or bleeding from your vagina. °Severe abdominal cramping or pain. °Shortness of breath or chest pain. °Any kind of trauma, such as from a fall or a car crash. °New or increased pain, swelling, or redness in an arm or leg. °Summary °The first trimester of pregnancy starts on the first day of your last menstrual period until the end of week  12 (months 1 through 3). °Eating 4 or 5 small meals a day rather than 3 large meals may help to relieve nausea and vomiting. °Do not use any products that contain nicotine or tobacco, such as cigarettes, e-cigarettes, and chewing tobacco. If you need help quitting, ask your health care provider. °Keep all follow-up visits. This is important. °This information is not intended to replace advice given to you by your health care provider. Make sure you discuss any questions you have with your health care provider. °Document Revised: 08/27/2019 Document Reviewed: 07/03/2019 °Elsevier Patient Education © 2022 Elsevier Inc. ° °

## 2021-01-12 ENCOUNTER — Encounter: Payer: Medicaid Other | Admitting: Obstetrics and Gynecology

## 2021-01-12 LAB — HEMOGLOBIN A1C
Est. average glucose Bld gHb Est-mCnc: 105 mg/dL
Hgb A1c MFr Bld: 5.3 % (ref 4.8–5.6)

## 2021-01-13 LAB — HCV INTERPRETATION

## 2021-01-13 LAB — COMPREHENSIVE METABOLIC PANEL
ALT: 10 IU/L (ref 0–32)
AST: 14 IU/L (ref 0–40)
Albumin/Globulin Ratio: 1.5 (ref 1.2–2.2)
Albumin: 4.1 g/dL (ref 3.8–4.8)
Alkaline Phosphatase: 66 IU/L (ref 44–121)
BUN/Creatinine Ratio: 11 (ref 9–23)
BUN: 7 mg/dL (ref 6–20)
Bilirubin Total: <0.2 mg/dL (ref 0.0–1.2)
CO2: 20 mmol/L (ref 20–29)
Calcium: 9.7 mg/dL (ref 8.7–10.2)
Chloride: 100 mmol/L (ref 96–106)
Creatinine, Ser: 0.66 mg/dL (ref 0.57–1.00)
Globulin, Total: 2.8 g/dL (ref 1.5–4.5)
Glucose: 83 mg/dL (ref 70–99)
Potassium: 4.1 mmol/L (ref 3.5–5.2)
Sodium: 137 mmol/L (ref 134–144)
Total Protein: 6.9 g/dL (ref 6.0–8.5)
eGFR: 117 mL/min/{1.73_m2} (ref >59)

## 2021-01-13 LAB — CBC/D/PLT+RPR+RH+ABO+RUBIGG...
Antibody Screen: NEGATIVE
Basophils Absolute: 0 10*3/uL (ref 0.0–0.2)
Basos: 0 %
EOS (ABSOLUTE): 0 10*3/uL (ref 0.0–0.4)
Eos: 0 %
HCV Ab: <0.1 s/co ratio (ref 0.0–0.9)
HIV Screen 4th Generation wRfx: NONREACTIVE
Hematocrit: 32.7 % — ABNORMAL LOW (ref 34.0–46.6)
Hemoglobin: 11.1 g/dL (ref 11.1–15.9)
Hepatitis B Surface Ag: NEGATIVE
Immature Grans (Abs): 0 10*3/uL (ref 0.0–0.1)
Immature Granulocytes: 0 %
Lymphocytes Absolute: 2 10*3/uL (ref 0.7–3.1)
Lymphs: 26 %
MCH: 31 pg (ref 26.6–33.0)
MCHC: 33.9 g/dL (ref 31.5–35.7)
MCV: 91 fL (ref 79–97)
Monocytes Absolute: 0.7 10*3/uL (ref 0.1–0.9)
Monocytes: 9 %
Neutrophils Absolute: 5 10*3/uL (ref 1.4–7.0)
Neutrophils: 65 %
Platelets: 345 10*3/uL (ref 150–450)
RBC: 3.58 x10E6/uL — ABNORMAL LOW (ref 3.77–5.28)
RDW: 13.2 % (ref 11.7–15.4)
RPR Ser Ql: NONREACTIVE
Rh Factor: POSITIVE
Rubella Antibodies, IGG: 5.97 index (ref >0.99)
WBC: 7.7 10*3/uL (ref 3.4–10.8)

## 2021-01-13 LAB — CYTOLOGY - PAP
Comment: NEGATIVE
Diagnosis: NEGATIVE
High risk HPV: NEGATIVE

## 2021-01-13 LAB — PROTEIN / CREATININE RATIO, URINE
Creatinine, Urine: 139.4 mg/dL
Protein, Ur: 9.2 mg/dL
Protein/Creat Ratio: 66 mg/g creat (ref 0–200)

## 2021-01-13 LAB — TSH: TSH: 0.874 u[IU]/mL (ref 0.450–4.500)

## 2021-01-14 LAB — CERVICOVAGINAL ANCILLARY ONLY
Bacterial Vaginitis (gardnerella): POSITIVE — AB
Candida Glabrata: NEGATIVE
Candida Vaginitis: NEGATIVE
Chlamydia: NEGATIVE
Comment: NEGATIVE
Comment: NEGATIVE
Comment: NEGATIVE
Comment: NEGATIVE
Comment: NEGATIVE
Comment: NORMAL
Neisseria Gonorrhea: NEGATIVE
Trichomonas: NEGATIVE

## 2021-01-14 MED ORDER — METRONIDAZOLE 500 MG PO TABS: 500.0000 mg | ORAL_TABLET | Freq: Two times a day (BID) | ORAL | 0 refills | Status: DC

## 2021-01-14 NOTE — Addendum Note (Signed)
Addended by: Jaynie Collins A on: 01/14/2021 11:21 AM   Modules accepted: Orders

## 2021-01-16 LAB — URINE CULTURE, OB REFLEX

## 2021-01-16 LAB — CULTURE, OB URINE

## 2021-01-19 ENCOUNTER — Other Ambulatory Visit: Payer: Self-pay | Admitting: Obstetrics & Gynecology

## 2021-01-19 ENCOUNTER — Other Ambulatory Visit: Payer: Self-pay

## 2021-01-19 DIAGNOSIS — O26891 Other specified pregnancy related conditions, first trimester: Secondary | ICD-10-CM

## 2021-01-19 DIAGNOSIS — N898 Other specified noninflammatory disorders of vagina: Secondary | ICD-10-CM

## 2021-01-19 MED ORDER — METRONIDAZOLE 0.75 % VA GEL: 1.0000 | Freq: Two times a day (BID) | VAGINAL | 0 refills | Status: DC

## 2021-01-19 NOTE — Progress Notes (Signed)
Pt changed to Metrogel since unable to keep Flagyl down, ok per Dr. Macon Large.

## 2021-01-24 ENCOUNTER — Encounter: Payer: Self-pay | Admitting: *Deleted

## 2021-01-24 ENCOUNTER — Other Ambulatory Visit: Payer: Self-pay | Admitting: *Deleted

## 2021-01-24 DIAGNOSIS — B9689 Other specified bacterial agents as the cause of diseases classified elsewhere: Secondary | ICD-10-CM

## 2021-01-24 MED ORDER — METRONIDAZOLE 0.75 % VA GEL: 1.0000 | Freq: Every day | VAGINAL | 0 refills | Status: DC

## 2021-02-08 ENCOUNTER — Encounter: Payer: Self-pay | Admitting: Obstetrics and Gynecology

## 2021-02-08 ENCOUNTER — Other Ambulatory Visit: Payer: Self-pay

## 2021-02-08 ENCOUNTER — Ambulatory Visit (INDEPENDENT_AMBULATORY_CARE_PROVIDER_SITE_OTHER): Payer: BC Managed Care – PPO | Admitting: Obstetrics and Gynecology

## 2021-02-08 VITALS — BP 102/67 | HR 98 | Wt 183.0 lb

## 2021-02-08 DIAGNOSIS — Z3480 Encounter for supervision of other normal pregnancy, unspecified trimester: Secondary | ICD-10-CM | POA: Diagnosis not present

## 2021-02-08 MED ORDER — CYCLOBENZAPRINE HCL 10 MG PO TABS
10.0000 mg | ORAL_TABLET | Freq: Three times a day (TID) | ORAL | 2 refills | Status: DC | PRN
Start: 2021-02-08 — End: 2021-03-04

## 2021-02-08 NOTE — Progress Notes (Signed)
ROB/AFP, c/o migraines headaches 5-10/10  almost everyday, Tylenol does not work.

## 2021-02-08 NOTE — Progress Notes (Signed)
   PRENATAL VISIT NOTE  Subjective:  Valerie Taylor is a 35 y.o. G5P1031 at [redacted]w[redacted]d being seen today for ongoing prenatal care.  She is currently monitored for the following issues for this low-risk pregnancy and has Depression; History of unilateral salpingectomy; and Supervision of other normal pregnancy, antepartum on their problem list.  Patient reports migraine headaches daily.  Contractions: Not present. Vag. Bleeding: None.   . Denies leaking of fluid.   The following portions of the patient's history were reviewed and updated as appropriate: allergies, current medications, past family history, past medical history, past social history, past surgical history and problem list.   Objective:   Vitals:   02/08/21 0934  BP: 102/67  Pulse: 98  Weight: 183 lb (83 kg)    Fetal Status: Fetal Heart Rate (bpm): 156         General:  Alert, oriented and cooperative. Patient is in no acute distress.  Skin: Skin is warm and dry. No rash noted.   Cardiovascular: Normal heart rate noted  Respiratory: Normal respiratory effort, no problems with respiration noted  Abdomen: Soft, gravid, appropriate for gestational age.  Pain/Pressure: Absent     Pelvic: Cervical exam deferred        Extremities: Normal range of motion.  Edema: None  Mental Status: Normal mood and affect. Normal behavior. Normal judgment and thought content.   Assessment and Plan:  Pregnancy: G5P1031 at [redacted]w[redacted]d 1. Supervision of other normal pregnancy, antepartum Patient is doing well AFP today Follow up anatomy ultrasound Rx flexeril provided Patient also referred to Georgiann Mccoy headache specialist Continue ASA - AFP, Serum, Open Spina Bifida  Preterm labor symptoms and general obstetric precautions including but not limited to vaginal bleeding, contractions, leaking of fluid and fetal movement were reviewed in detail with the patient. Please refer to After Visit Summary for other counseling recommendations.   Return  in about 4 weeks (around 03/08/2021) for in person, ROB, Low risk.  Future Appointments  Date Time Provider Department Center  03/04/2021  8:30 AM [facility] NURSE [facility] [place]  03/04/2021  8:45 AM [facility] US5 [facility]    Catalina Antigua, MD

## 2021-02-10 LAB — AFP, SERUM, OPEN SPINA BIFIDA
AFP MoM: 2.08
AFP Value: 65.2 ng/mL
Gest. Age on Collection Date: 15.4 weeks
Maternal Age At EDD: 35.6 yr
OSBR Risk 1 IN: 1351
Test Results:: NEGATIVE
Weight: 183 [lb_av]

## 2021-03-04 ENCOUNTER — Other Ambulatory Visit: Payer: Self-pay

## 2021-03-04 ENCOUNTER — Encounter: Payer: Self-pay | Admitting: *Deleted

## 2021-03-04 ENCOUNTER — Other Ambulatory Visit: Payer: Self-pay | Admitting: *Deleted

## 2021-03-04 ENCOUNTER — Ambulatory Visit: Payer: BC Managed Care – PPO | Attending: Obstetrics & Gynecology

## 2021-03-04 ENCOUNTER — Ambulatory Visit: Payer: BC Managed Care – PPO | Admitting: *Deleted

## 2021-03-04 VITALS — BP 116/60 | HR 92

## 2021-03-04 DIAGNOSIS — O283 Abnormal ultrasonic finding on antenatal screening of mother: Secondary | ICD-10-CM | POA: Diagnosis not present

## 2021-03-04 DIAGNOSIS — O09522 Supervision of elderly multigravida, second trimester: Secondary | ICD-10-CM

## 2021-03-04 DIAGNOSIS — Z3A19 19 weeks gestation of pregnancy: Secondary | ICD-10-CM | POA: Diagnosis not present

## 2021-03-04 DIAGNOSIS — Z3480 Encounter for supervision of other normal pregnancy, unspecified trimester: Secondary | ICD-10-CM

## 2021-03-04 DIAGNOSIS — O99210 Obesity complicating pregnancy, unspecified trimester: Secondary | ICD-10-CM

## 2021-03-04 DIAGNOSIS — O99212 Obesity complicating pregnancy, second trimester: Secondary | ICD-10-CM | POA: Diagnosis not present

## 2021-03-04 DIAGNOSIS — Z363 Encounter for antenatal screening for malformations: Secondary | ICD-10-CM | POA: Diagnosis not present

## 2021-03-04 DIAGNOSIS — O4442 Low lying placenta NOS or without hemorrhage, second trimester: Secondary | ICD-10-CM

## 2021-03-05 ENCOUNTER — Encounter: Payer: Self-pay | Admitting: Obstetrics & Gynecology

## 2021-03-05 DIAGNOSIS — O283 Abnormal ultrasonic finding on antenatal screening of mother: Secondary | ICD-10-CM | POA: Insufficient documentation

## 2021-03-08 ENCOUNTER — Other Ambulatory Visit: Payer: Self-pay

## 2021-03-08 ENCOUNTER — Ambulatory Visit (INDEPENDENT_AMBULATORY_CARE_PROVIDER_SITE_OTHER): Payer: BC Managed Care – PPO | Admitting: Nurse Practitioner

## 2021-03-08 ENCOUNTER — Encounter: Payer: Self-pay | Admitting: Obstetrics

## 2021-03-08 VITALS — BP 119/74 | HR 95 | Wt 187.0 lb

## 2021-03-08 DIAGNOSIS — Z3480 Encounter for supervision of other normal pregnancy, unspecified trimester: Secondary | ICD-10-CM

## 2021-03-08 DIAGNOSIS — Z3A19 19 weeks gestation of pregnancy: Secondary | ICD-10-CM

## 2021-03-08 NOTE — Progress Notes (Signed)
    Subjective:  Valerie Taylor is a 35 y.o. [id number] at [redacted]w[redacted]d being seen today for ongoing prenatal care.  She is currently monitored for the following issues for this low-risk pregnancy and has Posterior low-lying placenta in second trimester; Depression; History of unilateral salpingectomy; Supervision of other normal pregnancy, antepartum; and Fetal bilateral urinary tract dilations (UTD) on ultrasound on their problem list.  Patient reports no complaints.  Contractions: Not present. Vag. Bleeding: None.  Movement: Present. Denies leaking of fluid.   The following portions of the patient's history were reviewed and updated as appropriate: allergies, current medications, past family history, past medical history, past social history, past surgical history and problem list. Problem list updated.  Objective:   Vitals:   03/08/21 1531  BP: 119/74  Pulse: 95  Weight: 187 lb (84.8 kg)    Fetal Status: Fetal Heart Rate (bpm): 150   Movement: Present     General:  Alert, oriented and cooperative. Patient is in no acute distress.  Skin: Skin is warm and dry. No rash noted.   Cardiovascular: Normal heart rate noted  Respiratory: Normal respiratory effort, no problems with respiration noted  Abdomen: Soft, gravid, appropriate for gestational age. Pain/Pressure: Absent     Pelvic:  Cervical exam deferred        Extremities: Normal range of motion.     Mental Status: Normal mood and affect. Normal behavior. Normal judgment and thought content.   Urinalysis:      Assessment and Plan:  Pregnancy: G5P1031 at [redacted]w[redacted]d  1. Supervision of other normal pregnancy, antepartum Doing well Had anatomy US - low lying placenta and fetal UTD noted - another appointment scheduled for follow up Has an abscess that may need a root canal - given letter for dentist.  2. [redacted] weeks gestation of pregnancy   Preterm labor symptoms and general obstetric precautions including but not limited to vaginal bleeding,  contractions, leaking of fluid and fetal movement were reviewed in detail with the patient. Please refer to After Visit Summary for other counseling recommendations.  Return in about 4 weeks (around 04/05/2021) for in person ROB.  Nolene Bernheim, RN, MSN, NP-BC Nurse Practitioner, [facility] for [employer], [facility] 03/08/2021 5:02 PM

## 2021-04-01 ENCOUNTER — Encounter: Payer: BC Managed Care – PPO | Admitting: Physician Assistant

## 2021-04-05 ENCOUNTER — Other Ambulatory Visit: Payer: Self-pay

## 2021-04-05 ENCOUNTER — Ambulatory Visit (INDEPENDENT_AMBULATORY_CARE_PROVIDER_SITE_OTHER): Payer: BC Managed Care – PPO | Admitting: Nurse Practitioner

## 2021-04-05 ENCOUNTER — Other Ambulatory Visit (HOSPITAL_COMMUNITY)
Admission: RE | Admit: 2021-04-05 | Discharge: 2021-04-05 | Disposition: A | Discharge status: Home / Self Care | Payer: BC Managed Care – PPO | Source: Ambulatory Visit | Attending: Nurse Practitioner | Admitting: Nurse Practitioner

## 2021-04-05 VITALS — BP 120/65 | HR 103 | Wt 191.0 lb

## 2021-04-05 DIAGNOSIS — B3731 Acute candidiasis of vulva and vagina: Secondary | ICD-10-CM

## 2021-04-05 DIAGNOSIS — N898 Other specified noninflammatory disorders of vagina: Secondary | ICD-10-CM

## 2021-04-05 DIAGNOSIS — Z3A23 23 weeks gestation of pregnancy: Secondary | ICD-10-CM

## 2021-04-05 DIAGNOSIS — Z3480 Encounter for supervision of other normal pregnancy, unspecified trimester: Secondary | ICD-10-CM

## 2021-04-05 DIAGNOSIS — N760 Acute vaginitis: Secondary | ICD-10-CM

## 2021-04-05 DIAGNOSIS — B9689 Other specified bacterial agents as the cause of diseases classified elsewhere: Secondary | ICD-10-CM

## 2021-04-05 NOTE — Progress Notes (Signed)
° ° °  Subjective:  Valerie Taylor is a 36 y.o. G5P1031 at [redacted]w[redacted]d being seen today for ongoing prenatal care.  She is currently monitored for the following issues for this low-risk pregnancy and has Posterior low-lying placenta in second trimester; Depression; History of unilateral salpingectomy; Supervision of other normal pregnancy, antepartum; and Fetal bilateral urinary tract dilations (UTD) on ultrasound on their problem list.  Patient reports no complaints.  Contractions: Not present. Vag. Bleeding: None.  Movement: Present. Denies leaking of fluid.   The following portions of the patient's history were reviewed and updated as appropriate: allergies, current medications, past family history, past medical history, past social history, past surgical history and problem list. Problem list updated.  Objective:   Vitals:   04/05/21 1555  BP: 120/65  Pulse: (!) 103  Weight: 191 lb (86.6 kg)    Fetal Status: Fetal Heart Rate (bpm): 151 Fundal Height: 23 cm Movement: Present     General:  Alert, oriented and cooperative. Patient is in no acute distress.  Skin: Skin is warm and dry. No rash noted.   Cardiovascular: Normal heart rate noted  Respiratory: Normal respiratory effort, no problems with respiration noted  Abdomen: Soft, gravid, appropriate for gestational age. Pain/Pressure: Absent     Pelvic:  Cervical exam deferred        Extremities: Normal range of motion.  Edema: None  Mental Status: Normal mood and affect. Normal behavior. Normal judgment and thought content.   Urinalysis:      Assessment and Plan:  Pregnancy: G5P1031 at [redacted]w[redacted]d  1. Supervision of other normal pregnancy, antepartum Doing well [place] in February Denies any problems with depression Anticipatory guidance for Glucola and TDAP due at next visit  2. Vaginal discharge Partner uses scented soap and patient feels her vagina is different when he has used that soap.  Checking for any infection today.  -  Cervicovaginal ancillary only  3. [redacted] weeks gestation of pregnancy   Preterm labor symptoms and general obstetric precautions including but not limited to vaginal bleeding, contractions, leaking of fluid and fetal movement were reviewed in detail with the patient. Please refer to After Visit Summary for other counseling recommendations.  Return in about 4 weeks (around 05/03/2021) for early am appt for fasting glucola and ROB.  Nolene Bernheim, RN, MSN, NP-BC Nurse Practitioner, [facility] for [employer], [facility] 04/05/2021 4:17 PM

## 2021-04-05 NOTE — Progress Notes (Signed)
Pt thinks she has BV or Yeast. Pt denies symptoms but states she can feel it. Pt requesting to self swab.

## 2021-04-06 LAB — CERVICOVAGINAL ANCILLARY ONLY
Bacterial Vaginitis (gardnerella): POSITIVE — AB
Candida Glabrata: NEGATIVE
Candida Vaginitis: POSITIVE — AB
Comment: NEGATIVE
Comment: NEGATIVE
Comment: NEGATIVE

## 2021-04-07 ENCOUNTER — Other Ambulatory Visit: Payer: Self-pay

## 2021-04-07 DIAGNOSIS — B9689 Other specified bacterial agents as the cause of diseases classified elsewhere: Secondary | ICD-10-CM

## 2021-04-07 MED ORDER — TINIDAZOLE 500 MG PO TABS: 2.0000 g | ORAL_TABLET | Freq: Every day | ORAL | 0 refills | Status: DC

## 2021-04-07 MED ORDER — TERCONAZOLE 0.4 % VA CREA: 1.0000 | TOPICAL_CREAM | Freq: Every day | VAGINAL | 1 refills | Status: AC

## 2021-04-07 MED ORDER — METRONIDAZOLE 0.75 % VA GEL: 1.0000 | Freq: Two times a day (BID) | VAGINAL | 0 refills | Status: DC

## 2021-04-07 MED ORDER — METRONIDAZOLE 500 MG PO TABS: 500.0000 mg | ORAL_TABLET | Freq: Two times a day (BID) | ORAL | 0 refills | Status: DC

## 2021-04-07 NOTE — Addendum Note (Signed)
Addended by: Currie Paris on: 04/07/2021 09:27 AM   Modules accepted: Orders

## 2021-04-25 ENCOUNTER — Encounter (HOSPITAL_COMMUNITY): Payer: Self-pay | Admitting: Obstetrics and Gynecology

## 2021-04-25 ENCOUNTER — Inpatient Hospital Stay (HOSPITAL_COMMUNITY)
Admission: AD | Admit: 2021-04-25 | Discharge: 2021-04-25 | Disposition: A | Discharge status: Home / Self Care | Payer: BC Managed Care – PPO | Attending: Family Medicine | Admitting: Family Medicine

## 2021-04-25 DIAGNOSIS — O09522 Supervision of elderly multigravida, second trimester: Secondary | ICD-10-CM | POA: Diagnosis not present

## 2021-04-25 DIAGNOSIS — O4452 Low lying placenta with hemorrhage, second trimester: Secondary | ICD-10-CM | POA: Diagnosis not present

## 2021-04-25 DIAGNOSIS — Z3A26 26 weeks gestation of pregnancy: Secondary | ICD-10-CM | POA: Insufficient documentation

## 2021-04-25 DIAGNOSIS — O26892 Other specified pregnancy related conditions, second trimester: Secondary | ICD-10-CM | POA: Diagnosis not present

## 2021-04-25 DIAGNOSIS — Z3689 Encounter for other specified antenatal screening: Secondary | ICD-10-CM | POA: Insufficient documentation

## 2021-04-25 DIAGNOSIS — R1032 Left lower quadrant pain: Secondary | ICD-10-CM | POA: Insufficient documentation

## 2021-04-25 DIAGNOSIS — R109 Unspecified abdominal pain: Secondary | ICD-10-CM | POA: Diagnosis not present

## 2021-04-25 DIAGNOSIS — Z3480 Encounter for supervision of other normal pregnancy, unspecified trimester: Secondary | ICD-10-CM

## 2021-04-25 LAB — URINALYSIS, ROUTINE W REFLEX MICROSCOPIC
Bacteria, UA: NONE SEEN
Bilirubin Urine: NEGATIVE
Glucose, UA: NEGATIVE mg/dL
Ketones, ur: 5 mg/dL — AB
Leukocytes,Ua: NEGATIVE
Nitrite: NEGATIVE
Protein, ur: NEGATIVE mg/dL
Specific Gravity, Urine: 1.02 (ref 1.005–1.030)
pH: 6 (ref 5.0–8.0)

## 2021-04-25 LAB — WET PREP, GENITAL
Clue Cells Wet Prep HPF POC: NONE SEEN
Sperm: NONE SEEN
Trich, Wet Prep: NONE SEEN
WBC, Wet Prep HPF POC: <10 (ref <10)
Yeast Wet Prep HPF POC: NONE SEEN

## 2021-04-25 MED ORDER — CEFAZOLIN SODIUM-DEXTROSE 2-4 GM/100ML-% IV SOLN
INTRAVENOUS | Status: AC
  Filled 2021-04-25: qty 100

## 2021-04-25 MED ORDER — FAMOTIDINE 20 MG PO TABS
ORAL_TABLET | ORAL | Status: AC
  Filled 2021-04-25: qty 1

## 2021-04-25 MED ORDER — CHLORHEXIDINE GLUCONATE 0.12 % MT SOLN
OROMUCOSAL | Status: AC
  Filled 2021-04-25: qty 15

## 2021-04-25 MED ORDER — SOD CITRATE-CITRIC ACID 500-334 MG/5ML PO SOLN
ORAL | Status: AC
  Filled 2021-04-25: qty 30

## 2021-04-25 MED ORDER — SCOPOLAMINE 1 MG/3DAYS TD PT72
MEDICATED_PATCH | TRANSDERMAL | Status: AC
  Filled 2021-04-25: qty 1

## 2021-04-25 NOTE — Discharge Instructions (Signed)
Pelvic rest, no sexual relations or stimulation of any type.  Nothing in vagina.

## 2021-04-25 NOTE — MAU Note (Signed)
Started spotting yesterday, still present today (red), no clots.  Started feeling pain and pressure yesterday. Does not feel like ctx's - just pressure.

## 2021-04-25 NOTE — MAU Provider Note (Addendum)
Patient Valerie Taylor is a 36 y.o. [phone]  At 9w3dhere with complaints of abdominal pain that started last night during and after intercourse. She denies LOF, contractions, decreased fetal movements. She denies any complications in this pregnancy; she denies any history of Preterm delivery, C/section, G/DM, G/HTN.   She had intercourse last night around 9:30pm.  History     CSN: [id number] Arrival date and time: 04/25/21 03748  Event Date/Time   First Provider Initiated Contact with Patient 04/25/21 1037      Chief Complaint  Patient presents with   Abdominal Pain   Vaginal Bleeding   Abdominal Pain This is a new problem. The current episode started yesterday. The onset quality is sudden. The problem occurs intermittently. The problem has been unchanged. The pain is located in the LLQ. The pain is at a severity of 4/10. The quality of the pain is cramping. The abdominal pain does not radiate. Pertinent negatives include no constipation, diarrhea, dysuria, fever, nausea or vomiting. Nothing aggravates the pain. The pain is relieved by Being still.  Vaginal Bleeding This is a new problem. The problem has been resolved. Associated symptoms include abdominal pain. Pertinent negatives include no constipation, diarrhea, dysuria, fever, nausea or vomiting. The symptoms are aggravated by intercourse. She has tried nothing for the symptoms.  She reports blood when she wiped after intercourse last night.   OB History     Gravida  5   Para  1   Term  1   Preterm      AB  3   Living  1      SAB  1   IAB      Ectopic  2   Multiple  0   Live Births  1        Obstetric Comments  2004 per pt          Past Medical History:  Diagnosis Date   Headache    History of ectopic pregnancy 12/06/2017   History of pregnancy loss in prior pregnancy, currently pregnant 12/06/2017   4 months loss- patient unclear of events Records requested   Infection    UTI   Kidney stones     Yeast dermatitis 06/02/2020    Past Surgical History:  Procedure Laterality Date   DIAGNOSTIC LAPAROSCOPY WITH REMOVAL OF ECTOPIC PREGNANCY N/A 01/13/2020   Procedure: DIAGNOSTIC LAPAROSCOPY WITH REMOVAL OF ECTOPIC PREGNANCY AND LEFT SALPINGECTOMY;  Surgeon: EChancy Milroy MD;  Location: [facility]  Service: Gynecology;  Laterality: N/A;    Family History  Problem Relation Age of Onset   Healthy Mother    Healthy Father    Asthma Sister    Hypertension Maternal Grandmother    Alzheimer's disease Maternal Grandmother    Cancer Maternal Aunt        breast    Social History   Tobacco Use   Smoking status: Never   Smokeless tobacco: Never  Vaping Use   Vaping Use: Never used  Substance Use Topics   Alcohol use: Never   Drug use: Never    Allergies:  Allergies  Allergen Reactions   Latex     "Only when inserted in vagina", I.e. condoms, ? Spermicide or lubricant.  Causes vaginal swelling    Medications Prior to Admission  Medication Sig Dispense Refill Last Dose   Aspirin 81 MG CAPS SMARTSIG:1 Tablet(s) By Mouth Daily   04/25/2021   Prenatal MV & Min w/FA-DHA (PRENATAL GUMMIES PO) Take by mouth.  04/25/2021   Blood Pressure Monitoring (BLOOD PRESSURE KIT) DEVI 1 kit by Does not apply route once a week. 1 each 0    metroNIDAZOLE (FLAGYL) 500 MG tablet Take 1 tablet (500 mg total) by mouth 2 (two) times daily. No alcohol while taking this medication 14 tablet 0    tinidazole (TINDAMAX) 500 MG tablet Take 4 tablets (2,000 mg total) by mouth daily with breakfast. For two days 8 tablet 0     Review of Systems  Constitutional: Negative.  Negative for fever.  HENT: Negative.    Respiratory: Negative.    Cardiovascular: Negative.   Gastrointestinal:  Positive for abdominal pain. Negative for constipation, diarrhea, nausea and vomiting.  Genitourinary:  Positive for vaginal bleeding. Negative for dysuria.  Neurological: Negative.   Hematological: Negative.   Physical Exam    Blood pressure 123/62, pulse (!) 116, temperature 98.4 F (36.9 C), temperature source Oral, resp. rate 18, height '5\' 4"'  (1.626 m), weight 86.9 kg, last menstrual period 10/22/2020, SpO2 100 %, unknown if currently breastfeeding.  Physical Exam Constitutional:      Appearance: She is well-developed.  Abdominal:     General: Abdomen is flat. Bowel sounds are normal. There is no distension.     Palpations: Abdomen is soft.     Tenderness: There is no abdominal tenderness.  Genitourinary:    Vagina: Normal.  Neurological:     Mental Status: She is alert.   MAU Course  Procedures  MDM -NST: 140 bpm, mod var, present acel, no decels, no contractions -she denies any bleeding while in MAU she reports she still has LLQ pain but thinks its because she has a HA and feels tired and wants to go home and rest.  -reviewed [place], patient has low-lying placenta but not previa -wet prep negative  -UA shows Hgb in urine but patient has no bleeding, no dysuria, no complaints of back pain; low index of suspicion for pyelenephritis Patient is well-appearing; she is laughing and talking in bed with partner at the bedside.  Assessment and Plan   1. Abdominal pain during pregnancy in second trimester   2. Supervision of other normal pregnancy, antepartum   3. [redacted] weeks gestation of pregnancy   -patient and partner advised to abstain from intercourse for two weeks -GC CT pending -LLP to be reevaluated at [place] next week.  -2nd trimester precautions reviewed; patient verbalized understanding [place] 04/25/2021, 12:30 PM

## 2021-04-25 NOTE — Progress Notes (Signed)
Discharge instructions reviewed with patient and significant other. Patient verbalized an understanding of discharge instructions. Discharged ambulatory.

## 2021-04-26 LAB — GC/CHLAMYDIA PROBE AMP (~~LOC~~) NOT AT ARMC
Chlamydia: NEGATIVE
Comment: NEGATIVE
Comment: NORMAL
Neisseria Gonorrhea: NEGATIVE

## 2021-05-03 ENCOUNTER — Other Ambulatory Visit: Payer: BC Managed Care – PPO

## 2021-05-03 ENCOUNTER — Other Ambulatory Visit: Payer: Self-pay

## 2021-05-03 ENCOUNTER — Ambulatory Visit (INDEPENDENT_AMBULATORY_CARE_PROVIDER_SITE_OTHER): Payer: BC Managed Care – PPO | Admitting: Obstetrics & Gynecology

## 2021-05-03 VITALS — BP 107/71 | HR 97 | Wt 188.0 lb

## 2021-05-03 DIAGNOSIS — Z23 Encounter for immunization: Secondary | ICD-10-CM

## 2021-05-03 DIAGNOSIS — O283 Abnormal ultrasonic finding on antenatal screening of mother: Secondary | ICD-10-CM

## 2021-05-03 DIAGNOSIS — Z3480 Encounter for supervision of other normal pregnancy, unspecified trimester: Secondary | ICD-10-CM | POA: Diagnosis not present

## 2021-05-03 DIAGNOSIS — O4442 Low lying placenta NOS or without hemorrhage, second trimester: Secondary | ICD-10-CM

## 2021-05-03 NOTE — Progress Notes (Signed)
° °  PRENATAL VISIT NOTE  Subjective:  Valerie Taylor is a 36 y.o. [id number] at [redacted]w[redacted]d being seen today for ongoing prenatal care.  She is currently monitored for the following issues for this high-risk pregnancy and has Posterior low-lying placenta in second trimester; Depression; History of unilateral salpingectomy; Supervision of other normal pregnancy, antepartum; and Fetal bilateral urinary tract dilations (UTD) on ultrasound on their problem list.  Patient reports no complaints.  Contractions: Not present. Vag. Bleeding: None.  Movement: Present. Denies leaking of fluid.   The following portions of the patient's history were reviewed and updated as appropriate: allergies, current medications, past family history, past medical history, past social history, past surgical history and problem list.   Objective:   Vitals:   05/03/21 0835  BP: 107/71  Pulse: 97  Weight: 188 lb (85.3 kg)    Fetal Status: Fetal Heart Rate (bpm): 140   Movement: Present     General:  Alert, oriented and cooperative. Patient is in no acute distress.  Skin: Skin is warm and dry. No rash noted.   Cardiovascular: Normal heart rate noted  Respiratory: Normal respiratory effort, no problems with respiration noted  Abdomen: Soft, gravid, appropriate for gestational age.  Pain/Pressure: Absent     Pelvic: Cervical exam deferred        Extremities: Normal range of motion.  Edema: None  Mental Status: Normal mood and affect. Normal behavior. Normal judgment and thought content.   Assessment and Plan:  Pregnancy: [id number] at [redacted]w[redacted]d 1. Supervision of other normal pregnancy, antepartum Routine 2 hr - Glucose Tolerance, 2 Hours w/1 Hour - RPR - CBC - HIV antibody (with reflex) - Tdap vaccine greater than or equal to 7yo IM  2. Posterior low-lying placenta in second trimester F/u US this week  3. Fetal bilateral urinary tract dilations (UTD) on ultrasound F/u US  Preterm labor symptoms and general obstetric  precautions including but not limited to vaginal bleeding, contractions, leaking of fluid and fetal movement were reviewed in detail with the patient. Please refer to After Visit Summary for other counseling recommendations.   No follow-ups on file.  Future Appointments  Date Time Provider [place]  05/06/2021  8:30 AM [facility] NURSE [facility]  05/06/2021  8:45 AM [facility]4 [facility]    Emeterio Reeve, MD

## 2021-05-04 LAB — CBC
Hematocrit: 28.2 % — ABNORMAL LOW (ref 34.0–46.6)
Hemoglobin: 9.6 g/dL — ABNORMAL LOW (ref 11.1–15.9)
MCH: 29.4 pg (ref 26.6–33.0)
MCHC: 34 g/dL (ref 31.5–35.7)
MCV: 87 fL (ref 79–97)
Platelets: 319 10*3/uL (ref 150–450)
RBC: 3.26 x10E6/uL — ABNORMAL LOW (ref 3.77–5.28)
RDW: 11.8 % (ref 11.7–15.4)
WBC: 7.6 10*3/uL (ref 3.4–10.8)

## 2021-05-04 LAB — GLUCOSE TOLERANCE, 2 HOURS W/ 1HR
Glucose, 1 hour: 140 mg/dL (ref 70–179)
Glucose, 2 hour: 108 mg/dL (ref 70–152)
Glucose, Fasting: 75 mg/dL (ref 70–91)

## 2021-05-04 LAB — RPR: RPR Ser Ql: NONREACTIVE

## 2021-05-04 LAB — HIV ANTIBODY (ROUTINE TESTING W REFLEX): HIV Screen 4th Generation wRfx: NONREACTIVE

## 2021-05-06 ENCOUNTER — Ambulatory Visit (HOSPITAL_BASED_OUTPATIENT_CLINIC_OR_DEPARTMENT_OTHER): Payer: BC Managed Care – PPO | Admitting: Maternal & Fetal Medicine

## 2021-05-06 ENCOUNTER — Other Ambulatory Visit: Payer: Self-pay | Admitting: *Deleted

## 2021-05-06 ENCOUNTER — Encounter: Payer: Self-pay | Admitting: *Deleted

## 2021-05-06 ENCOUNTER — Encounter: Payer: Self-pay | Admitting: Obstetrics & Gynecology

## 2021-05-06 ENCOUNTER — Ambulatory Visit: Payer: BC Managed Care – PPO | Attending: Obstetrics and Gynecology

## 2021-05-06 ENCOUNTER — Other Ambulatory Visit: Payer: Self-pay | Admitting: Obstetrics and Gynecology

## 2021-05-06 ENCOUNTER — Ambulatory Visit: Payer: BC Managed Care – PPO | Admitting: *Deleted

## 2021-05-06 ENCOUNTER — Other Ambulatory Visit: Payer: Self-pay

## 2021-05-06 VITALS — BP 99/62 | HR 109

## 2021-05-06 DIAGNOSIS — O09522 Supervision of elderly multigravida, second trimester: Secondary | ICD-10-CM

## 2021-05-06 DIAGNOSIS — O4400 Placenta previa specified as without hemorrhage, unspecified trimester: Secondary | ICD-10-CM | POA: Diagnosis not present

## 2021-05-06 DIAGNOSIS — O4442 Low lying placenta NOS or without hemorrhage, second trimester: Secondary | ICD-10-CM

## 2021-05-06 DIAGNOSIS — Z3A28 28 weeks gestation of pregnancy: Secondary | ICD-10-CM | POA: Diagnosis not present

## 2021-05-06 DIAGNOSIS — O99212 Obesity complicating pregnancy, second trimester: Secondary | ICD-10-CM

## 2021-05-06 DIAGNOSIS — O99213 Obesity complicating pregnancy, third trimester: Secondary | ICD-10-CM | POA: Insufficient documentation

## 2021-05-06 DIAGNOSIS — O09523 Supervision of elderly multigravida, third trimester: Secondary | ICD-10-CM | POA: Diagnosis not present

## 2021-05-06 DIAGNOSIS — Z3480 Encounter for supervision of other normal pregnancy, unspecified trimester: Secondary | ICD-10-CM

## 2021-05-06 DIAGNOSIS — O4443 Low lying placenta NOS or without hemorrhage, third trimester: Secondary | ICD-10-CM | POA: Diagnosis not present

## 2021-05-06 DIAGNOSIS — O4403 Placenta previa specified as without hemorrhage, third trimester: Secondary | ICD-10-CM

## 2021-05-06 NOTE — Progress Notes (Signed)
MFM Brief Note  Ms. Helling is a 36 yo G5P1 who is seen today at 28 weeks for follow up growth and placenta location evaluation. She is seen at the request of Dr. Jaynie Collins.  A single intrauterine pregnancy is again seen with measurements consistent with dates. Positive growth is observed with good fetal movement and amniotic fluid.  On the previous exam a posterior low lying placenta was observed. Today on the transabdominal exam the placenta appeared to cover the cervix, therefore a transvaginal exam was performed.   We observed the placental edge is 6 mm over the internal os.  I discussed today's findings in particular that the placenta previa is associated with an increased risk of bleeding, inpatient stay, emergency cesarean delivery, preterm birth, and bleeding precautions were reviewed. We advised pelvic rest. Should this condition fail to resolve, we discussed the necessity of cesarean delivery.    Given that she is 27 weeks I suspect that the placenta may move far enough away for a safe vaginal delivery ~>1.5 cm from the cervix.  Ms. Clapham expressed and understanding.  All questions were answered  I spent 30 minutes with > 50% in face to face consultation.  Novella Olive, MD.

## 2021-05-24 ENCOUNTER — Encounter: Payer: Self-pay | Admitting: Obstetrics & Gynecology

## 2021-05-24 ENCOUNTER — Other Ambulatory Visit: Payer: Self-pay | Admitting: *Deleted

## 2021-05-24 ENCOUNTER — Encounter (HOSPITAL_BASED_OUTPATIENT_CLINIC_OR_DEPARTMENT_OTHER): Payer: Self-pay

## 2021-05-24 DIAGNOSIS — Z3480 Encounter for supervision of other normal pregnancy, unspecified trimester: Secondary | ICD-10-CM

## 2021-05-24 MED ORDER — PROMETHAZINE HCL 25 MG PO TABS: 25.0000 mg | ORAL_TABLET | Freq: Four times a day (QID) | ORAL | 0 refills | Status: DC | PRN

## 2021-05-25 MED ORDER — ONDANSETRON 4 MG PO TBDP: 4.0000 mg | ORAL_TABLET | Freq: Four times a day (QID) | ORAL | 0 refills | Status: DC | PRN

## 2021-05-25 NOTE — Progress Notes (Signed)
MyChart message from patient regarding nausea at 30.5 wks. Unable to take phenergan at work due it making her sleepy. VO Dr. Alysia Penna for Zofran ODT. RX sent.

## 2021-05-31 ENCOUNTER — Other Ambulatory Visit: Payer: Self-pay

## 2021-05-31 ENCOUNTER — Ambulatory Visit (INDEPENDENT_AMBULATORY_CARE_PROVIDER_SITE_OTHER): Payer: BC Managed Care – PPO | Admitting: Obstetrics & Gynecology

## 2021-05-31 VITALS — BP 109/74 | HR 99 | Wt 191.7 lb

## 2021-05-31 DIAGNOSIS — Z3480 Encounter for supervision of other normal pregnancy, unspecified trimester: Secondary | ICD-10-CM

## 2021-05-31 DIAGNOSIS — O219 Vomiting of pregnancy, unspecified: Secondary | ICD-10-CM

## 2021-05-31 DIAGNOSIS — O4403 Placenta previa specified as without hemorrhage, third trimester: Secondary | ICD-10-CM

## 2021-05-31 MED ORDER — PANTOPRAZOLE SODIUM 40 MG PO TBEC: 40.0000 mg | DELAYED_RELEASE_TABLET | Freq: Every day | ORAL | 1 refills | Status: DC

## 2021-05-31 MED ORDER — METOCLOPRAMIDE HCL 10 MG PO TABS: 10.0000 mg | ORAL_TABLET | Freq: Every day | ORAL | 1 refills | Status: DC

## 2021-05-31 NOTE — Progress Notes (Signed)
° °  PRENATAL VISIT NOTE  Subjective:  Valerie Taylor is a 36 y.o. [id number] at [redacted]w[redacted]d being seen today for ongoing prenatal care.  She is currently monitored for the following issues for this high-risk pregnancy and has Placenta previa, posterior, third trimester; Depression; History of unilateral salpingectomy; Supervision of other normal pregnancy, antepartum; Fetal bilateral urinary tract dilations (UTD) on ultrasound; and Nausea and vomiting in pregnancy on their problem list.  Patient reports nausea and vomiting.  Contractions: Not present.  .  Movement: Present. Denies leaking of fluid.   The following portions of the patient's history were reviewed and updated as appropriate: allergies, current medications, past family history, past medical history, past social history, past surgical history and problem list.   Objective:   Vitals:   05/31/21 1528  BP: 109/74  Pulse: 99  Weight: 191 lb 11.2 oz (87 kg)    Fetal Status: Fetal Heart Rate (bpm): 128   Movement: Present     General:  Alert, oriented and cooperative. Patient is in no acute distress.  Skin: Skin is warm and dry. No rash noted.   Cardiovascular: Normal heart rate noted  Respiratory: Normal respiratory effort, no problems with respiration noted  Abdomen: Soft, gravid, appropriate for gestational age.  Pain/Pressure: Absent     Pelvic: Cervical exam deferred        Extremities: Normal range of motion.  Edema: None  Mental Status: Normal mood and affect. Normal behavior. Normal judgment and thought content.   Assessment and Plan:  Pregnancy: [id number] at [redacted]w[redacted]d 1. Supervision of other normal pregnancy, antepartum Nausea and emesis at night suspect reflux - pantoprazole (PROTONIX) 40 MG tablet; Take 1 tablet (40 mg total) by mouth daily.  Dispense: 30 tablet; Refill: 1 - metoCLOPramide (REGLAN) 10 MG tablet; Take 1 tablet (10 mg total) by mouth at bedtime.  Dispense: 30 tablet; Refill: 1  2. Placenta previa, posterior,  third trimester F/u US this week  3. Nausea and vomiting in pregnancy Meds ordered this encounter  Medications   pantoprazole (PROTONIX) 40 MG tablet    Sig: Take 1 tablet (40 mg total) by mouth daily.    Dispense:  30 tablet    Refill:  1   metoCLOPramide (REGLAN) 10 MG tablet    Sig: Take 1 tablet (10 mg total) by mouth at bedtime.    Dispense:  30 tablet    Refill:  1     Preterm labor symptoms and general obstetric precautions including but not limited to vaginal bleeding, contractions, leaking of fluid and fetal movement were reviewed in detail with the patient. Please refer to After Visit Summary for other counseling recommendations.   Return in about 2 weeks (around 06/14/2021).  Future Appointments  Date Time Provider [place]  06/03/2021 10:30 AM [facility] NURSE [facility]  06/03/2021 10:45 AM [facility]6 [facility]  06/14/2021  3:50 PM Woodroe Mode, MD [facility] None    Emeterio Reeve, MD

## 2021-05-31 NOTE — Progress Notes (Signed)
ROB 31.4 wks Reports continued nausea/ vomiting "can't keep anything down", not taking zofran sts makes her sleepy at work.

## 2021-06-03 ENCOUNTER — Encounter: Payer: Self-pay | Admitting: *Deleted

## 2021-06-03 ENCOUNTER — Ambulatory Visit: Payer: BC Managed Care – PPO | Admitting: *Deleted

## 2021-06-03 ENCOUNTER — Other Ambulatory Visit: Payer: Self-pay

## 2021-06-03 ENCOUNTER — Other Ambulatory Visit: Payer: Self-pay | Admitting: Maternal & Fetal Medicine

## 2021-06-03 ENCOUNTER — Ambulatory Visit: Payer: BC Managed Care – PPO | Attending: Obstetrics

## 2021-06-03 VITALS — BP 107/60 | HR 122

## 2021-06-03 DIAGNOSIS — O4403 Placenta previa specified as without hemorrhage, third trimester: Secondary | ICD-10-CM

## 2021-06-03 DIAGNOSIS — O99213 Obesity complicating pregnancy, third trimester: Secondary | ICD-10-CM | POA: Diagnosis not present

## 2021-06-03 DIAGNOSIS — O4443 Low lying placenta NOS or without hemorrhage, third trimester: Secondary | ICD-10-CM | POA: Diagnosis not present

## 2021-06-03 DIAGNOSIS — Z3A32 32 weeks gestation of pregnancy: Secondary | ICD-10-CM | POA: Diagnosis not present

## 2021-06-03 DIAGNOSIS — Z3480 Encounter for supervision of other normal pregnancy, unspecified trimester: Secondary | ICD-10-CM

## 2021-06-03 DIAGNOSIS — O09523 Supervision of elderly multigravida, third trimester: Secondary | ICD-10-CM | POA: Diagnosis not present

## 2021-06-06 ENCOUNTER — Telehealth: Payer: Self-pay

## 2021-06-06 ENCOUNTER — Inpatient Hospital Stay (HOSPITAL_COMMUNITY)
Admission: AD | Admit: 2021-06-06 | Discharge: 2021-06-06 | Disposition: A | Discharge status: Home / Self Care | Payer: BC Managed Care – PPO | Attending: Obstetrics and Gynecology | Admitting: Obstetrics and Gynecology

## 2021-06-06 ENCOUNTER — Encounter (HOSPITAL_COMMUNITY): Payer: Self-pay | Admitting: Obstetrics and Gynecology

## 2021-06-06 DIAGNOSIS — O26893 Other specified pregnancy related conditions, third trimester: Secondary | ICD-10-CM | POA: Diagnosis not present

## 2021-06-06 DIAGNOSIS — O09293 Supervision of pregnancy with other poor reproductive or obstetric history, third trimester: Secondary | ICD-10-CM | POA: Insufficient documentation

## 2021-06-06 DIAGNOSIS — Z3A32 32 weeks gestation of pregnancy: Secondary | ICD-10-CM | POA: Diagnosis not present

## 2021-06-06 DIAGNOSIS — O4403 Placenta previa specified as without hemorrhage, third trimester: Secondary | ICD-10-CM

## 2021-06-06 DIAGNOSIS — R1030 Lower abdominal pain, unspecified: Secondary | ICD-10-CM | POA: Diagnosis not present

## 2021-06-06 DIAGNOSIS — O4413 Placenta previa with hemorrhage, third trimester: Secondary | ICD-10-CM | POA: Insufficient documentation

## 2021-06-06 DIAGNOSIS — O09523 Supervision of elderly multigravida, third trimester: Secondary | ICD-10-CM | POA: Insufficient documentation

## 2021-06-06 DIAGNOSIS — O212 Late vomiting of pregnancy: Secondary | ICD-10-CM | POA: Diagnosis not present

## 2021-06-06 DIAGNOSIS — O26853 Spotting complicating pregnancy, third trimester: Secondary | ICD-10-CM | POA: Insufficient documentation

## 2021-06-06 DIAGNOSIS — N898 Other specified noninflammatory disorders of vagina: Secondary | ICD-10-CM | POA: Diagnosis not present

## 2021-06-06 LAB — WET PREP, GENITAL
Clue Cells Wet Prep HPF POC: NONE SEEN
Sperm: NONE SEEN
Trich, Wet Prep: NONE SEEN
WBC, Wet Prep HPF POC: >=10 — AB (ref <10)
Yeast Wet Prep HPF POC: NONE SEEN

## 2021-06-06 LAB — URINALYSIS, ROUTINE W REFLEX MICROSCOPIC
Bilirubin Urine: NEGATIVE
Glucose, UA: NEGATIVE mg/dL
Ketones, ur: NEGATIVE mg/dL
Nitrite: NEGATIVE
Protein, ur: NEGATIVE mg/dL
Specific Gravity, Urine: 1.009 (ref 1.005–1.030)
Squamous Epithelial / HPF: >50 — ABNORMAL HIGH (ref 0–5)
pH: 6 (ref 5.0–8.0)

## 2021-06-06 MED ORDER — ACETAMINOPHEN 500 MG PO TABS
1000.0000 mg | ORAL_TABLET | Freq: Once | ORAL | Status: AC
  Administered 2021-06-06: 1000 mg via ORAL
  Filled 2021-06-06: qty 2

## 2021-06-06 NOTE — Discharge Instructions (Signed)

## 2021-06-06 NOTE — MAU Provider Note (Signed)
?History  ?  ? ?CSN: [id number] ? ?Arrival date and time: 06/06/21 2041 ? ? Event Date/Time  ? First Provider Initiated Contact with Patient 06/06/21 2123   ?  ? ?Chief Complaint  ?Patient presents with  ? Abdominal Pain  ? Vaginal Bleeding  ? ?HPI ?Valerie Taylor is a 36 y.o. [id number] at 92w3dwho presents with vaginal bleeding. She states she went to MFM on 3/3 and had a transvaginal ultrasound which confirmed she still has a posterior previa. On Friday she had light pink spotting. Saturday and Sunday she reports she saw bright red bleeding when she wiped and today she sees brown discharge when she wipes. She reports intermittent lower abdominal cramping but states this is her normal. She reports she has cramping every day and this feels the same to her. She denies any leaking of fluid and reports normal fetal movement. She reports she does not drink water or eat much because of nausea and vomiting. She reports all antiemetics make her too sleepy to work.  ? ?OB History   ? ? Gravida  ?5  ? Para  ?1  ? Term  ?1  ? Preterm  ?   ? AB  ?3  ? Living  ?1  ?  ? ? SAB  ?1  ? IAB  ?   ? Ectopic  ?2  ? Multiple  ?0  ? Live Births  ?1  ?   ?  ? Obstetric Comments  ?2004 per pt   ?  ? ?  ? ? ?Past Medical History:  ?Diagnosis Date  ? Headache   ? History of ectopic pregnancy 12/06/2017  ? History of pregnancy loss in prior pregnancy, currently pregnant 12/06/2017  ? 4 months loss- patient unclear of events Records requested  ? Infection   ? UTI  ? Kidney stones   ? Yeast dermatitis 06/02/2020  ? ? ?Past Surgical History:  ?Procedure Laterality Date  ? DIAGNOSTIC LAPAROSCOPY WITH REMOVAL OF ECTOPIC PREGNANCY N/A 01/13/2020  ? Procedure: DIAGNOSTIC LAPAROSCOPY WITH REMOVAL OF ECTOPIC PREGNANCY AND LEFT SALPINGECTOMY;  Surgeon: EChancy Milroy MD;  [place] [facility]  Service: Gynecology;  Laterality: N/A;  ? ? ?Family History  ?Problem Relation Age of Onset  ? Healthy Mother   ? Healthy Father   ? Asthma Sister   ? Hypertension  Maternal Grandmother   ? Alzheimer's disease Maternal Grandmother   ? Cancer Maternal Aunt   ?     breast  ? ? ?Social History  ? ?Tobacco Use  ? Smoking status: Never  ? Smokeless tobacco: Never  ?Vaping Use  ? Vaping Use: Never used  ?Substance Use Topics  ? Alcohol use: Never  ? Drug use: Never  ? ? ?Allergies:  ?Allergies  ?Allergen Reactions  ? Latex   ?  "Only when inserted in vagina", I.e. condoms, ? Spermicide or lubricant.  Causes vaginal swelling  ? ? ?Medications Prior to Admission  ?Medication Sig Dispense Refill Last Dose  ? Aspirin 81 MG CAPS SMARTSIG:1 Tablet(s) By Mouth Daily     ? Blood Pressure Monitoring (BLOOD PRESSURE KIT) DEVI 1 kit by Does not apply route once a week. 1 each 0   ? metoCLOPramide (REGLAN) 10 MG tablet Take 1 tablet (10 mg total) by mouth at bedtime. (Patient not taking: Reported on 06/03/2021) 30 tablet 1   ? ondansetron (ZOFRAN-ODT) 4 MG disintegrating tablet Take 1 tablet (4 mg total) by mouth every 6 (six) hours as needed for nausea. (  Patient not taking: Reported on 05/31/2021) 20 tablet 0   ? pantoprazole (PROTONIX) 40 MG tablet Take 1 tablet (40 mg total) by mouth daily. 30 tablet 1   ? Prenatal MV & Min w/FA-DHA (PRENATAL GUMMIES PO) Take by mouth.     ? promethazine (PHENERGAN) 25 MG tablet Take 1 tablet (25 mg total) by mouth every 6 (six) hours as needed for nausea or vomiting. (Patient not taking: Reported on 05/31/2021) 30 tablet 0   ? ? ?Review of Systems  ?Constitutional: Negative.  Negative for fatigue and fever.  ?HENT: Negative.    ?Respiratory: Negative.  Negative for shortness of breath.   ?Cardiovascular: Negative.  Negative for chest pain.  ?Gastrointestinal: Negative.  Negative for abdominal pain, constipation, diarrhea, nausea and vomiting.  ?Genitourinary:  Positive for vaginal bleeding. Negative for dysuria and vaginal discharge.  ?Neurological: Negative.  Negative for dizziness and headaches.  ?Physical Exam  ? ?Blood pressure (!) 117/57, pulse 87,  temperature 98 ?F (36.7 ?C), temperature source Oral, resp. rate 20, height '5\' 4"'  (1.626 m), weight 86.8 kg, last menstrual period 10/22/2020, SpO2 99 %, unknown if currently breastfeeding. ? ?Physical Exam ?Vitals and nursing note reviewed.  ?Constitutional:   ?   General: She is not in acute distress. ?   Appearance: She is well-developed.  ?HENT:  ?   Head: Normocephalic.  ?Eyes:  ?   Pupils: Pupils are equal, round, and reactive to light.  ?Cardiovascular:  ?   Rate and Rhythm: Normal rate and regular rhythm.  ?   Heart sounds: Normal heart sounds.  ?Pulmonary:  ?   Effort: Pulmonary effort is normal. No respiratory distress.  ?   Breath sounds: Normal breath sounds.  ?Abdominal:  ?   General: Bowel sounds are normal. There is no distension.  ?   Palpations: Abdomen is soft.  ?   Tenderness: There is no abdominal tenderness.  ?Genitourinary: ?   Comments: Pelvic exam: Cervix pink, visually closed, without lesion, vaginal walls and external genitalia normal, Large amount of creamy yellow discharge with flecks of brown. No active bleeding noted ? ? ?Skin: ?   General: Skin is warm and dry.  ?Neurological:  ?   Mental Status: She is alert and oriented to person, place, and time.  ?Psychiatric:     ?   Mood and Affect: Mood normal.     ?   Behavior: Behavior normal.     ?   Thought Content: Thought content normal.     ?   Judgment: Judgment normal.  ? ? ?MAU Course  ?Procedures ?Results for orders placed or performed during the hospital encounter of 06/06/21 (from the past 24 hour(s))  ?Wet prep, genital     Status: Abnormal  ? Collection Time: 06/06/21  9:30 PM  ?Result Value Ref Range  ? Yeast Wet Prep HPF POC NONE SEEN NONE SEEN  ? Trich, Wet Prep NONE SEEN NONE SEEN  ? Clue Cells Wet Prep HPF POC NONE SEEN NONE SEEN  ? WBC, Wet Prep HPF POC >=10 (A) <10  ? Sperm NONE SEEN   ?Urinalysis, Routine w reflex microscopic     Status: Abnormal  ? Collection Time: 06/06/21  9:30 PM  ?Result Value Ref Range  ? Color,  Urine YELLOW YELLOW  ? APPearance CLOUDY (A) CLEAR  ? Specific Gravity, Urine 1.009 1.005 - 1.030  ? pH 6.0 5.0 - 8.0  ? Glucose, UA NEGATIVE NEGATIVE mg/dL  ? Hgb urine dipstick SMALL (A)  NEGATIVE  ? Bilirubin Urine NEGATIVE NEGATIVE  ? Ketones, ur NEGATIVE NEGATIVE mg/dL  ? Protein, ur NEGATIVE NEGATIVE mg/dL  ? Nitrite NEGATIVE NEGATIVE  ? Leukocytes,Ua SMALL (A) NEGATIVE  ? RBC / HPF 0-5 0 - 5 RBC/hpf  ? WBC, UA 6-10 0 - 5 WBC/hpf  ? Bacteria, UA FEW (A) NONE SEEN  ? Squamous Epithelial / LPF >50 (H) 0 - 5  ? Mucus PRESENT   ?  ?MDM ?UA ?Wet prep ? ?Consulted with Dr. Elgie Congo regarding patient report and findings today- MD ok with discharge tonight with strict return precautions regarding bleeding.  ? ?Assessment and Plan  ? ?1. Placenta previa antepartum in third trimester   ?2. Vaginal discharge during pregnancy in third trimester   ?3. [redacted] weeks gestation of pregnancy   ? ?-Discharge home in stable condition ?-Vaginal bleeding precautions discussed ?-Patient advised to follow-up with OB as scheduled for prenatal care ?-Patient may return to MAU as needed or if her condition were to change or worsen ? ? ?Wende Mott CNM ?06/06/2021, 9:23 PM  ?

## 2021-06-06 NOTE — Telephone Encounter (Signed)
S/w patient and she stated that she has been having pelvic pains similar to menstrual cramps. Pt states yesterday she had light spotting and today it is a darker red, hx of posterior previa. Consulted with in office provider, pt will be evaluated at MAU ?

## 2021-06-06 NOTE — MAU Note (Signed)
Valerie Taylor is a 36 y.o. at [redacted]w[redacted]d here in MAU reporting: Pt having left sided abdominal pain. Had some light red bleeding which stopped. Today noticed dark red bleeding around 4pm today only when she wipes. No recent intercourse, known previa. ?LMP:  ?Onset of complaint: 06/05/21 ?Pain score: 10/10 ?Vitals:  ? 06/06/21 2059  ?BP: (!) 117/57  ?Pulse: 87  ?Resp: 20  ?Temp: 98 ?F (36.7 ?C)  ?SpO2: 99%  ?   ?FHT:144 ?Lab orders placed from triage:  ? ?

## 2021-06-08 ENCOUNTER — Other Ambulatory Visit: Payer: Self-pay | Admitting: *Deleted

## 2021-06-08 DIAGNOSIS — Z6831 Body mass index (BMI) 31.0-31.9, adult: Secondary | ICD-10-CM

## 2021-06-08 DIAGNOSIS — O09523 Supervision of elderly multigravida, third trimester: Secondary | ICD-10-CM

## 2021-06-08 DIAGNOSIS — O4440 Low lying placenta NOS or without hemorrhage, unspecified trimester: Secondary | ICD-10-CM

## 2021-06-14 ENCOUNTER — Encounter: Payer: BC Managed Care – PPO | Admitting: Obstetrics & Gynecology

## 2021-06-14 ENCOUNTER — Ambulatory Visit (INDEPENDENT_AMBULATORY_CARE_PROVIDER_SITE_OTHER): Payer: BC Managed Care – PPO | Admitting: Obstetrics & Gynecology

## 2021-06-14 ENCOUNTER — Other Ambulatory Visit: Payer: Self-pay

## 2021-06-14 ENCOUNTER — Encounter: Payer: Self-pay | Admitting: Obstetrics & Gynecology

## 2021-06-14 VITALS — BP 101/68 | HR 111 | Wt 185.4 lb

## 2021-06-14 DIAGNOSIS — O4403 Placenta previa specified as without hemorrhage, third trimester: Secondary | ICD-10-CM

## 2021-06-14 DIAGNOSIS — Z3480 Encounter for supervision of other normal pregnancy, unspecified trimester: Secondary | ICD-10-CM

## 2021-06-14 NOTE — Progress Notes (Signed)
? ?  PRENATAL VISIT NOTE ? ?Subjective:  ?Valerie Taylor is a 36 y.o. [id number] at [redacted]w[redacted]d being seen today for ongoing prenatal care.  She is currently monitored for the following issues for this high-risk pregnancy and has Placenta previa, posterior, third trimester; Depression; History of unilateral salpingectomy; Supervision of other normal pregnancy, antepartum; Fetal bilateral urinary tract dilations (UTD) on ultrasound; and Nausea and vomiting in pregnancy on their problem list. ? ?Patient reports  left abdominal pain that may last for hours .  Contractions: Not present. Vag. Bleeding: Small.  Movement: Present. Denies leaking of fluid.  ?She had postcoital vaginal bleeding with a small clot last night and some blood-streaked mucus today ?The following portions of the patient's history were reviewed and updated as appropriate: allergies, current medications, past family history, past medical history, past social history, past surgical history and problem list.  ? ?Objective:  ? ?Vitals:  ? 06/14/21 1616  ?BP: 101/68  ?Pulse: (!) 111  ?Weight: 185 lb 6.4 oz (84.1 kg)  ? ? ?Fetal Status: Fetal Heart Rate (bpm): 136   Movement: Present    ? ?General:  Alert, oriented and cooperative. Patient is in no acute distress.  ?Skin: Skin is warm and dry. No rash noted.   ?Cardiovascular: Normal heart rate noted  ?Respiratory: Normal respiratory effort, no problems with respiration noted  ?Abdomen: Soft, gravid, appropriate for gestational age.  Pain/Pressure: Absent     ?Pelvic: Cervical exam deferred        ?Extremities: Normal range of motion.  Edema: None  ?Mental Status: Normal mood and affect. Normal behavior. Normal judgment and thought content.  ? ?Assessment and Plan:  ?Pregnancy: [id number] at [redacted]w[redacted]d ?1. Supervision of other normal pregnancy, antepartum ?Strict pelvic rest  ? ?2. Placenta previa, posterior, third trimester ?Report to MAU if any increase in bleeding after yesterday's incident ?Out of work now, f/u US is  scheduled ?Preterm labor symptoms and general obstetric precautions including but not limited to vaginal bleeding, contractions, leaking of fluid and fetal movement were reviewed in detail with the patient. ?Please refer to After Visit Summary for other counseling recommendations.  ? ?Return in about 1 week (around 06/21/2021). ? ?Future Appointments  ?Date Time Provider [place]  ?07/01/2021 11:15 AM [facility] NURSE [facility]  ?07/01/2021 11:30 AM [facility]3 [facility]  ? ? ?Emeterio Reeve, MD ? ?

## 2021-06-14 NOTE — Progress Notes (Signed)
Patient presents for ROB. Patient states that she had a quarter size blood clot last night after intercourse. She states that the last time that she saw bleeding prior to last night was on 3/3. Also complains of left side abdominal pain that is consistent.  ?

## 2021-06-21 ENCOUNTER — Ambulatory Visit (INDEPENDENT_AMBULATORY_CARE_PROVIDER_SITE_OTHER): Payer: BC Managed Care – PPO | Admitting: Obstetrics and Gynecology

## 2021-06-21 ENCOUNTER — Other Ambulatory Visit: Payer: Self-pay

## 2021-06-21 VITALS — BP 102/66 | HR 96 | Wt 186.0 lb

## 2021-06-21 DIAGNOSIS — O283 Abnormal ultrasonic finding on antenatal screening of mother: Secondary | ICD-10-CM

## 2021-06-21 DIAGNOSIS — Z3A34 34 weeks gestation of pregnancy: Secondary | ICD-10-CM

## 2021-06-21 DIAGNOSIS — Z3480 Encounter for supervision of other normal pregnancy, unspecified trimester: Secondary | ICD-10-CM

## 2021-06-21 DIAGNOSIS — O4403 Placenta previa specified as without hemorrhage, third trimester: Secondary | ICD-10-CM

## 2021-06-21 NOTE — Progress Notes (Signed)
ROB 34.[redacted] wks GA ?No complaints. ?Per MFM may deliver at 37 wks/ concern for previa and growth restriction per pt. ?

## 2021-06-21 NOTE — Progress Notes (Signed)
? ?  PRENATAL VISIT NOTE ? ?Subjective:  ?Chauntia Rico is a 36 y.o. [id number] at [redacted]w[redacted]d being seen today for ongoing prenatal care.  She is currently monitored for the following issues for this high-risk pregnancy and has Placenta previa, posterior, third trimester; Depression; History of unilateral salpingectomy; Supervision of other normal pregnancy, antepartum; Fetal bilateral urinary tract dilations (UTD) on ultrasound; and Nausea and vomiting in pregnancy on their problem list. ? ?Patient doing well with no acute concerns today. She reports no complaints.  Contractions: Not present. Vag. Bleeding: None.  Movement: Present. Denies leaking of fluid.  ? ?The following portions of the patient's history were reviewed and updated as appropriate: allergies, current medications, past family history, past medical history, past social history, past surgical history and problem list. Problem list updated. ? ?Objective:  ? ?Vitals:  ? 06/21/21 1622  ?BP: 102/66  ?Pulse: 96  ?Weight: 186 lb (84.4 kg)  ? ? ?Fetal Status: Fetal Heart Rate (bpm): 132 Fundal Height: 35 cm Movement: Present    ? ?General:  Alert, oriented and cooperative. Patient is in no acute distress.  ?Skin: Skin is warm and dry. No rash noted.   ?Cardiovascular: Normal heart rate noted  ?Respiratory: Normal respiratory effort, no problems with respiration noted  ?Abdomen: Soft, gravid, appropriate for gestational age.  Pain/Pressure: Absent     ?Pelvic: Cervical exam deferred        ?Extremities: Normal range of motion.  Edema: None  ?Mental Status:  Normal mood and affect. Normal behavior. Normal judgment and thought content.  ? ?Assessment and Plan:  ?Pregnancy: [id number] at [redacted]w[redacted]d ? ?1. [redacted] weeks gestation of pregnancy ? ? ?2. Supervision of other normal pregnancy, antepartum ?Continue routine care ? ?3. Placenta previa, posterior, third trimester ?Pt has follow up scan on 3/31, at last scan posterior previa still present ?Schedule primary c section for 37  weeks at next visit, pt declined scheduling today ?If follow up scan shows resolution of previa, cancel procedure ? ?4. Fetal bilateral urinary tract dilations (UTD) on ultrasound ?Bladder and kidneys normal on last ultrasound ? ?Preterm labor symptoms and general obstetric precautions including but not limited to vaginal bleeding, contractions, leaking of fluid and fetal movement were reviewed in detail with the patient. ? ?Please refer to After Visit Summary for other counseling recommendations.  ? ?Return in about 1 week (around 06/28/2021) for [facility], in person, 36 weeks swabs. ? ? ?Lynnda Shields, MD ?Faculty Attending ?[facility] ?  ?

## 2021-06-28 ENCOUNTER — Inpatient Hospital Stay (HOSPITAL_COMMUNITY)
Admission: AD | Admit: 2021-06-28 | Discharge: 2021-06-28 | Disposition: A | Discharge status: Home / Self Care | Payer: BC Managed Care – PPO | Attending: Obstetrics and Gynecology | Admitting: Obstetrics and Gynecology

## 2021-06-28 ENCOUNTER — Encounter (HOSPITAL_COMMUNITY): Payer: Self-pay | Admitting: Obstetrics and Gynecology

## 2021-06-28 ENCOUNTER — Encounter: Payer: Self-pay | Admitting: Obstetrics

## 2021-06-28 ENCOUNTER — Other Ambulatory Visit: Payer: Self-pay

## 2021-06-28 ENCOUNTER — Ambulatory Visit (INDEPENDENT_AMBULATORY_CARE_PROVIDER_SITE_OTHER): Payer: BC Managed Care – PPO | Admitting: Obstetrics

## 2021-06-28 VITALS — BP 110/68 | HR 103 | Wt 187.6 lb

## 2021-06-28 DIAGNOSIS — O0943 Supervision of pregnancy with grand multiparity, third trimester: Secondary | ICD-10-CM | POA: Insufficient documentation

## 2021-06-28 DIAGNOSIS — Z3689 Encounter for other specified antenatal screening: Secondary | ICD-10-CM | POA: Diagnosis not present

## 2021-06-28 DIAGNOSIS — O0993 Supervision of high risk pregnancy, unspecified, third trimester: Secondary | ICD-10-CM

## 2021-06-28 DIAGNOSIS — O4403 Placenta previa specified as without hemorrhage, third trimester: Secondary | ICD-10-CM

## 2021-06-28 DIAGNOSIS — Z3A35 35 weeks gestation of pregnancy: Secondary | ICD-10-CM | POA: Diagnosis not present

## 2021-06-28 DIAGNOSIS — Z3490 Encounter for supervision of normal pregnancy, unspecified, unspecified trimester: Secondary | ICD-10-CM

## 2021-06-28 DIAGNOSIS — O212 Late vomiting of pregnancy: Secondary | ICD-10-CM | POA: Diagnosis not present

## 2021-06-28 DIAGNOSIS — O99013 Anemia complicating pregnancy, third trimester: Secondary | ICD-10-CM | POA: Insufficient documentation

## 2021-06-28 DIAGNOSIS — O09523 Supervision of elderly multigravida, third trimester: Secondary | ICD-10-CM | POA: Insufficient documentation

## 2021-06-28 DIAGNOSIS — D649 Anemia, unspecified: Secondary | ICD-10-CM | POA: Insufficient documentation

## 2021-06-28 DIAGNOSIS — O4413 Placenta previa with hemorrhage, third trimester: Secondary | ICD-10-CM | POA: Diagnosis not present

## 2021-06-28 DIAGNOSIS — O0990 Supervision of high risk pregnancy, unspecified, unspecified trimester: Secondary | ICD-10-CM

## 2021-06-28 DIAGNOSIS — O4690 Antepartum hemorrhage, unspecified, unspecified trimester: Secondary | ICD-10-CM

## 2021-06-28 LAB — CBC
HCT: 26.2 % — ABNORMAL LOW (ref 36.0–46.0)
Hemoglobin: 8.4 g/dL — ABNORMAL LOW (ref 12.0–15.0)
MCH: 27.4 pg (ref 26.0–34.0)
MCHC: 32.1 g/dL (ref 30.0–36.0)
MCV: 85.3 fL (ref 80.0–100.0)
Platelets: 296 10*3/uL (ref 150–400)
RBC: 3.07 MIL/uL — ABNORMAL LOW (ref 3.87–5.11)
RDW: 13.9 % (ref 11.5–15.5)
WBC: 6.3 10*3/uL (ref 4.0–10.5)
nRBC: 0 % (ref 0.0–0.2)

## 2021-06-28 LAB — URINALYSIS, ROUTINE W REFLEX MICROSCOPIC
Bilirubin Urine: NEGATIVE
Glucose, UA: NEGATIVE mg/dL
Ketones, ur: NEGATIVE mg/dL
Nitrite: NEGATIVE
Protein, ur: NEGATIVE mg/dL
Specific Gravity, Urine: 1.016 (ref 1.005–1.030)
pH: 6 (ref 5.0–8.0)

## 2021-06-28 LAB — WET PREP, GENITAL
Clue Cells Wet Prep HPF POC: NONE SEEN
Sperm: NONE SEEN
Trich, Wet Prep: NONE SEEN
WBC, Wet Prep HPF POC: >=10 — AB (ref <10)
Yeast Wet Prep HPF POC: NONE SEEN

## 2021-06-28 MED ORDER — FERROUS SULFATE 325 (65 FE) MG PO TABS: 325.0000 mg | ORAL_TABLET | ORAL | 0 refills | Status: DC

## 2021-06-28 NOTE — Progress Notes (Signed)
Subjective:  ?Valerie Taylor is a 36 y.o. [id number] at [redacted]w[redacted]d being seen today for ongoing prenatal care.  She is currently monitored for the following issues for this high-risk pregnancy and has Placenta previa, posterior, third trimester; Depression; History of unilateral salpingectomy; Supervision of other normal pregnancy, antepartum; Fetal bilateral urinary tract dilations (UTD) on ultrasound; and Nausea and vomiting in pregnancy on their problem list. ? ?Patient reports vaginal spotting and passing a few small clots  .  .   . Denies leaking of fluid.  Denies contractions. ? ?The following portions of the patient's history were reviewed and updated as appropriate: allergies, current medications, past family history, past medical history, past social history, past surgical history and problem list. Problem list updated. ? ?Objective:  ?There were no vitals filed for this visit. ? ?Fetal Status:          ? ?General:  Alert, oriented and cooperative. Patient is in no acute distress.  ?Skin: Skin is warm and dry. No rash noted.   ?Cardiovascular: Normal heart rate noted  ?Respiratory: Normal respiratory effort, no problems with respiration noted  ?Abdomen: Soft, gravid, appropriate for gestational age.       ?Pelvic:  Cervical exam deferred        ?Extremities: Normal range of motion.     ?Mental Status: Normal mood and affect. Normal behavior. Normal judgment and thought content.  ? ?Urinalysis:     ? ?Assessment and Plan:  ?Pregnancy: [id number] at [redacted]w[redacted]d ? ?1. Supervision of high risk pregnancy, antepartum ? ?2. Placenta previa, posterior, third trimester ?- follow-up ultrasound on 07/01/2021 ?- will need C/S at 37 weeks if previa still present ?- patient sent to maternity admissions for evaluation of previa ? ?Preterm labor symptoms and general obstetric precautions including but not limited to vaginal bleeding, contractions, leaking of fluid and fetal movement were reviewed in detail with the patient. ?Please refer  to After Visit Summary for other counseling recommendations.  ? ? ?Shelly Bombard, MD  ?06/28/21  ?

## 2021-06-28 NOTE — MAU Provider Note (Addendum)
?History  ?  ? ?[id number] ? ?Arrival date and time: 06/28/21 0947 ?  ? ?Chief Complaint  ?Patient presents with  ? Vaginal Bleeding  ? ? ? ?HPI ?Valerie Taylor is a 36 y.o. at 20w4dby LMP with PMHx notable for placenta previa, who presents for vaginal spotting.  It started yesterday, she small a small dark red clot- dime sized and then on occasion when she voids she will see some dark red blood on the toilet paper.  This does not occur every time.  She denies BRB per vagina.  Denies pelvic or abdominal pain.  No LOF, +Fetal movement.  ? ?Prenatal care with [facility] ? ?Pregnancy complicated by: ?-placenta previa- last [place]3/3 ?-nausea/vomiting in pregnancy: typically 1-2 per day, no improvement with medication.  Remains about the same ? ?OB History   ? ? Gravida  ?5  ? Para  ?1  ? Term  ?1  ? Preterm  ?   ? AB  ?3  ? Living  ?1  ?  ? ? SAB  ?1  ? IAB  ?   ? Ectopic  ?2  ? Multiple  ?0  ? Live Births  ?1  ?   ?  ? Obstetric Comments  ?2004 per pt   ?  ? ?  ? ? ?Past Medical History:  ?Diagnosis Date  ? Headache   ? History of ectopic pregnancy 12/06/2017  ? History of pregnancy loss in prior pregnancy, currently pregnant 12/06/2017  ? 4 months loss- patient unclear of events Records requested  ? Infection   ? UTI  ? Kidney stones   ? Yeast dermatitis 06/02/2020  ? ? ?Past Surgical History:  ?Procedure Laterality Date  ? DIAGNOSTIC LAPAROSCOPY WITH REMOVAL OF ECTOPIC PREGNANCY N/A 01/13/2020  ? Procedure: DIAGNOSTIC LAPAROSCOPY WITH REMOVAL OF ECTOPIC PREGNANCY AND LEFT SALPINGECTOMY;  Surgeon: EChancy Milroy MD;  [place] [facility]  Service: Gynecology;  Laterality: N/A;  ? ? ?Family History  ?Problem Relation Age of Onset  ? Healthy Mother   ? Healthy Father   ? Asthma Sister   ? Hypertension Maternal Grandmother   ? Alzheimer's disease Maternal Grandmother   ? Cancer Maternal Aunt   ?     breast  ? ? ?Allergies  ?Allergen Reactions  ? Latex   ?  "Only when inserted in vagina", I.e. condoms, ? Spermicide or lubricant.   Causes vaginal swelling ? ?OK with Latex gloves  ? ? ?No current facility-administered medications on file prior to encounter.  ? ?Current Outpatient Medications on File Prior to Encounter  ?Medication Sig Dispense Refill  ? Aspirin 81 MG CAPS SMARTSIG:1 Tablet(s) By Mouth Daily    ? Prenatal MV & Min w/FA-DHA (PRENATAL GUMMIES PO) Take by mouth.    ? Blood Pressure Monitoring (BLOOD PRESSURE KIT) DEVI 1 kit by Does not apply route once a week. (Patient not taking: Reported on 06/14/2021) 1 each 0  ? ? ?Review of Systems  ?Constitutional:  Negative for chills, fever and malaise/fatigue.  ?HENT: Negative.    ?Eyes: Negative.   ?Respiratory: Negative.    ?Cardiovascular: Negative.   ?Gastrointestinal:  Positive for vomiting.  ?Genitourinary: Negative.   ?Musculoskeletal: Negative.   ?Skin:  Negative for itching and rash.  ?Neurological: Negative.   ?Endo/Heme/Allergies: Negative.   ?Psychiatric/Behavioral: Negative.    ?Pertinent positives and negative per HPI, all others reviewed and negative ? ?Physical Exam  ? ?BP 112/66 (BP Location: Right Arm)   Pulse (!) 101  Temp 98.2 ?F (36.8 ?C) (Oral)   Resp 17   Ht '5\' 4"'  (1.626 m)   Wt 84.7 kg   LMP 10/22/2020   SpO2 100%   BMI 32.06 kg/m?  ? ?Patient Vitals for the past 24 hrs: ? BP Temp Temp src Pulse Resp SpO2 Height Weight  ?06/28/21 0958 112/66 98.2 ?F (36.8 ?C) Oral (!) 101 17 100 % '5\' 4"'  (1.626 m) 84.7 kg  ? ? ?Physical Exam ?Constitutional:   ?   Appearance: Normal appearance.  ?HENT:  ?   Head: Normocephalic.  ?Cardiovascular:  ?   Rate and Rhythm: Normal rate and regular rhythm.  ?Pulmonary:  ?   Effort: Pulmonary effort is normal. No respiratory distress.  ?   Breath sounds: Normal breath sounds. No wheezing.  ?Abdominal:  ?   Comments: Gravid, soft and non-tender  ?Genitourinary: ?   Comments: Normal external genitalia, normal vaginal mucosa, thick white discharge noted, cervix visualized- no active bleeding noted, small streak of dark red blood noted  in discharge- removed with swab.  No bleeding noted from cervix ?Musculoskeletal:     ?   General: No tenderness.  ?   Cervical back: Normal range of motion.  ?Skin: ?   General: Skin is warm and dry.  ?Neurological:  ?   General: No focal deficit present.  ?   Mental Status: She is alert and oriented to person, place, and time.  ?Psychiatric:     ?   Mood and Affect: Mood normal.     ?   Behavior: Behavior normal.     ?   Thought Content: Thought content normal.     ?   Judgment: Judgment normal.  ?  ? ?Cervical Exam ? Deferred due to placenta previa ? ?FHT ?Baseline 130, moderate variability, present accels x2 15x15 accels, absent decels ?Toco: no contractions ?Cat: Cat. I- reactive NST ? ?Labs ?Results for orders placed or performed during the hospital encounter of 06/28/21 (from the past 24 hour(s))  ?CBC     Status: Abnormal  ? Collection Time: 06/28/21 10:29 AM  ?Result Value Ref Range  ? WBC 6.3 4.0 - 10.5 K/uL  ? RBC 3.07 (L) 3.87 - 5.11 MIL/uL  ? Hemoglobin 8.4 (L) 12.0 - 15.0 g/dL  ? HCT 26.2 (L) 36.0 - 46.0 %  ? MCV 85.3 80.0 - 100.0 fL  ? MCH 27.4 26.0 - 34.0 pg  ? MCHC 32.1 30.0 - 36.0 g/dL  ? RDW 13.9 11.5 - 15.5 %  ? Platelets 296 150 - 400 K/uL  ? nRBC 0.0 0.0 - 0.2 %  ?Wet prep, genital     Status: Abnormal  ? Collection Time: 06/28/21 10:59 AM  ?Result Value Ref Range  ? Yeast Wet Prep HPF POC NONE SEEN NONE SEEN  ? Trich, Wet Prep NONE SEEN NONE SEEN  ? Clue Cells Wet Prep HPF POC NONE SEEN NONE SEEN  ? WBC, Wet Prep HPF POC >=10 (A) <10  ? Sperm NONE SEEN   ? ? ?MAU Course  ?Procedures ?Lab Orders    ?     OB Urine Culture    ?     Wet prep, genital    ?     CBC    ?     Urinalysis, Routine w reflex microscopic    ? ? ?MDM ?-examination completed including pelvic exam ?-lab work collected as above ?Assessment and Plan  ? ?1. Placenta previa in third trimester   ?2.  Vaginal bleeding in pregnancy   ?3. Intrauterine pregnancy   ?4. Anemia during pregnancy in third trimester   ? ?FWB- reassuring,  reactive NST ?No evidence of infection or preter labor ?No active bleeding noted on exam ?Plan to start iron every other day ?Reviewed labor precautions as well as placenta previa/bleeding precautions ?Follow up as scheduled ? ?Annalee Genta, DO ?06/28/21 ?11:15 AM ?  ?

## 2021-06-28 NOTE — MAU Note (Signed)
Valerie Taylor is a 36 y.o. at [redacted]w[redacted]d here in MAU reporting: been bleeding off and on since yesterday. Her dr sent her in for further eval, known previa. (Minimal- only when she wipes) no pain. No LOF, reports +FM ? ?Onset of complaint: yesterday ?Pain score: none ?Vitals:  ? 06/28/21 0958  ?BP: 112/66  ?Pulse: (!) 101  ?Resp: 17  ?Temp: 98.2 ?F (36.8 ?C)  ?SpO2: 100%  ?   ?FHT:138 ?Lab orders placed from triage:  none ?

## 2021-06-28 NOTE — Progress Notes (Signed)
Pt presents for ROB. Pt states that she has noticed a bit of red blood and clots when she wipes since yesterday morning. Notices decreased activity with baby kicks since yesterday morning.  ?

## 2021-07-01 ENCOUNTER — Ambulatory Visit: Payer: BC Managed Care – PPO | Admitting: *Deleted

## 2021-07-01 ENCOUNTER — Other Ambulatory Visit: Payer: Self-pay | Admitting: Maternal & Fetal Medicine

## 2021-07-01 ENCOUNTER — Ambulatory Visit: Payer: BC Managed Care – PPO | Attending: Maternal & Fetal Medicine

## 2021-07-01 ENCOUNTER — Encounter: Payer: Self-pay | Admitting: *Deleted

## 2021-07-01 VITALS — BP 115/53 | HR 98

## 2021-07-01 DIAGNOSIS — O99213 Obesity complicating pregnancy, third trimester: Secondary | ICD-10-CM

## 2021-07-01 DIAGNOSIS — O4440 Low lying placenta NOS or without hemorrhage, unspecified trimester: Secondary | ICD-10-CM | POA: Diagnosis not present

## 2021-07-01 DIAGNOSIS — O09523 Supervision of elderly multigravida, third trimester: Secondary | ICD-10-CM

## 2021-07-01 DIAGNOSIS — Z6831 Body mass index (BMI) 31.0-31.9, adult: Secondary | ICD-10-CM | POA: Insufficient documentation

## 2021-07-01 DIAGNOSIS — Z362 Encounter for other antenatal screening follow-up: Secondary | ICD-10-CM | POA: Diagnosis not present

## 2021-07-01 DIAGNOSIS — O4403 Placenta previa specified as without hemorrhage, third trimester: Secondary | ICD-10-CM | POA: Diagnosis not present

## 2021-07-01 DIAGNOSIS — E669 Obesity, unspecified: Secondary | ICD-10-CM | POA: Diagnosis not present

## 2021-07-01 DIAGNOSIS — Z3A36 36 weeks gestation of pregnancy: Secondary | ICD-10-CM | POA: Insufficient documentation

## 2021-07-04 ENCOUNTER — Other Ambulatory Visit: Payer: Self-pay | Admitting: *Deleted

## 2021-07-04 NOTE — Progress Notes (Signed)
Prior Auth pantoprazole (Protonix) 40 mg submitted via CoverMyMeds. ?

## 2021-07-05 ENCOUNTER — Encounter: Payer: Self-pay | Admitting: Obstetrics & Gynecology

## 2021-07-05 ENCOUNTER — Ambulatory Visit (INDEPENDENT_AMBULATORY_CARE_PROVIDER_SITE_OTHER): Payer: BC Managed Care – PPO | Admitting: Obstetrics & Gynecology

## 2021-07-05 ENCOUNTER — Inpatient Hospital Stay (HOSPITAL_COMMUNITY): Admission: RE | Admit: 2021-07-05 | Payer: BC Managed Care – PPO | Source: Home / Self Care | Admitting: Family Medicine

## 2021-07-05 ENCOUNTER — Telehealth: Payer: Self-pay | Admitting: Family Medicine

## 2021-07-05 ENCOUNTER — Telehealth: Payer: Self-pay

## 2021-07-05 VITALS — BP 109/72 | HR 91 | Wt 188.4 lb

## 2021-07-05 DIAGNOSIS — Z3480 Encounter for supervision of other normal pregnancy, unspecified trimester: Secondary | ICD-10-CM

## 2021-07-05 DIAGNOSIS — O4403 Placenta previa specified as without hemorrhage, third trimester: Secondary | ICD-10-CM

## 2021-07-05 SURGERY — Surgical Case
Anesthesia: Regional

## 2021-07-05 NOTE — Telephone Encounter (Signed)
Patient is a [id number] at [redacted]w[redacted]d with known placenta previa and having intermittent vaginal bleeding. Seen by Dr. Debroah Loop in the office today and was recommended to come in for cesarean delivery given her vaginal bleeding in setting of previa. Patient did not come in and on inquiry reported she would wait to come in until she makes arrangements at home.  ? ?Patient called L&D desk to ask about her cesarean section. She asked secretary about her scheduled c-section at midnight.  ? ?I got on phone and talked with patient. We discussed that she should head to the hospital at her soonest convenience given she has a known previa and has had bleeding. Reports she is not currently having any bleeding. She reports she would likely head this way but is not a 100% sure if she is coming or when she will be here. Reports she still has to figure out childcare.  ? ?We discussed that it is understandable that she is trying to figure out her childcare. We also discussed that given her bleeding in setting of previa both her life and her baby's life and well being could be in danger. She expresses understanding and reports she will head over as soon as able to but not right away.  ? ?I reiterated my recommendation that she come in as soon as possible and that she would be at the very least observed and likely delivered tomorrow or sooner if there are maternal or fetal indications including worsening fetal or maternal stability. ? ?Warner Mccreedy, MD, MPH ?OB Fellow, Faculty Practice ? ?

## 2021-07-05 NOTE — Telephone Encounter (Signed)
Spoke with Pt and explained to her that it is imperative that she goes to MAU Today.  Pt refused to do so, stating that she had lots of arrangements to make and will go to MAU tomorrow Morning.  I told Pt that with her bleeding it could be an emergency for her.  She said that "she understands that but she could not make any promises about going to MAU anytime Today". ?

## 2021-07-05 NOTE — Progress Notes (Signed)
? ?  PRENATAL VISIT NOTE ? ?Subjective:  ?Valerie Taylor is a 36 y.o. [id number] at [redacted]w[redacted]d being seen today for ongoing prenatal care.  She is currently monitored for the following issues for this high-risk pregnancy and has Placenta previa, posterior, third trimester; Depression; History of unilateral salpingectomy; Supervision of other normal pregnancy, antepartum; Fetal bilateral urinary tract dilations (UTD) on ultrasound; and Nausea and vomiting in pregnancy on their problem list. ? ?Patient reports bleeding.  Contractions: Not present. Vag. Bleeding: Moderate.  Movement: Present. Denies leaking of fluid.  ? ?The following portions of the patient's history were reviewed and updated as appropriate: allergies, current medications, past family history, past medical history, past social history, past surgical history and problem list.  ? ?Objective:  ? ?Vitals:  ? 07/05/21 1405  ?BP: 109/72  ?Pulse: 91  ?Weight: 188 lb 6.4 oz (85.5 kg)  ? ? ?Fetal Status: Fetal Heart Rate (bpm): 161   Movement: Present    ? ?General:  Alert, oriented and cooperative. Patient is in no acute distress.  ?Skin: Skin is warm and dry. No rash noted.   ?Cardiovascular: Normal heart rate noted  ?Respiratory: Normal respiratory effort, no problems with respiration noted  ?Abdomen: Soft, gravid, appropriate for gestational age.  Pain/Pressure: Absent     ?Pelvic: Cervical exam deferred        ?Extremities: Normal range of motion.  Edema: None  ?Mental Status: Normal mood and affect. Normal behavior. Normal judgment and thought content.  ? ?Assessment and Plan:  ?Pregnancy: [id number] at [redacted]w[redacted]d ?1. Supervision of other normal pregnancy, antepartum ? ? ?2. Placenta previa antepartum in third trimester ?Bleeding reported last night and today. Was to be scheduled for CS at 37 weeks but will not wait due to bleeding. She was instructed to go to MAU  asapand she refused to go ASAP due to child care issues, after warning that the bleeding could become  heavy and life-threatening ? ?Preterm labor symptoms and general obstetric precautions including but not limited to vaginal bleeding, contractions, leaking of fluid and fetal movement were reviewed in detail with the patient. ?Please refer to After Visit Summary for other counseling recommendations.  ? ?Return if symptoms worsen or fail to improve. ? ?Future Appointments  ?Date Time Provider Noonday  ?07/14/2021  2:50 PM Renee Harder, CNM [facility] None  ? ? ?Emeterio Reeve, MD ? ?

## 2021-07-05 NOTE — Progress Notes (Signed)
Pt in office for ROB visit> Pt states she has had moderate bleeding yesterday and today with clots. Pt has no other complaints at this time.  ?

## 2021-07-06 ENCOUNTER — Inpatient Hospital Stay (HOSPITAL_COMMUNITY)
Admission: AD | Admit: 2021-07-06 | Discharge: 2021-07-08 | DRG: 784 | Disposition: A | Discharge status: Home / Self Care | Payer: BC Managed Care – PPO | Attending: Family Medicine | Admitting: Family Medicine

## 2021-07-06 ENCOUNTER — Other Ambulatory Visit: Payer: Self-pay | Admitting: *Deleted

## 2021-07-06 ENCOUNTER — Inpatient Hospital Stay (HOSPITAL_COMMUNITY): Payer: BC Managed Care – PPO | Admitting: Anesthesiology

## 2021-07-06 ENCOUNTER — Encounter (HOSPITAL_COMMUNITY)
Admission: AD | Disposition: A | Discharge status: Home / Self Care | Payer: Self-pay | Source: Home / Self Care | Attending: Family Medicine

## 2021-07-06 ENCOUNTER — Encounter (HOSPITAL_COMMUNITY): Payer: Self-pay | Admitting: Obstetrics & Gynecology

## 2021-07-06 ENCOUNTER — Encounter: Payer: Self-pay | Admitting: *Deleted

## 2021-07-06 ENCOUNTER — Other Ambulatory Visit: Payer: Self-pay

## 2021-07-06 DIAGNOSIS — Z3480 Encounter for supervision of other normal pregnancy, unspecified trimester: Principal | ICD-10-CM

## 2021-07-06 DIAGNOSIS — O9081 Anemia of the puerperium: Secondary | ICD-10-CM | POA: Diagnosis not present

## 2021-07-06 DIAGNOSIS — Z98891 History of uterine scar from previous surgery: Secondary | ICD-10-CM

## 2021-07-06 DIAGNOSIS — O4413 Placenta previa with hemorrhage, third trimester: Principal | ICD-10-CM | POA: Diagnosis present

## 2021-07-06 DIAGNOSIS — D62 Acute posthemorrhagic anemia: Secondary | ICD-10-CM | POA: Diagnosis not present

## 2021-07-06 DIAGNOSIS — O4403 Placenta previa specified as without hemorrhage, third trimester: Secondary | ICD-10-CM | POA: Diagnosis not present

## 2021-07-06 DIAGNOSIS — Z302 Encounter for sterilization: Secondary | ICD-10-CM | POA: Diagnosis not present

## 2021-07-06 DIAGNOSIS — Z3A36 36 weeks gestation of pregnancy: Secondary | ICD-10-CM

## 2021-07-06 DIAGNOSIS — Z9079 Acquired absence of other genital organ(s): Secondary | ICD-10-CM

## 2021-07-06 DIAGNOSIS — O283 Abnormal ultrasonic finding on antenatal screening of mother: Secondary | ICD-10-CM | POA: Diagnosis present

## 2021-07-06 DIAGNOSIS — O4400 Placenta previa specified as without hemorrhage, unspecified trimester: Secondary | ICD-10-CM | POA: Diagnosis present

## 2021-07-06 DIAGNOSIS — Z23 Encounter for immunization: Secondary | ICD-10-CM | POA: Diagnosis not present

## 2021-07-06 DIAGNOSIS — O4693 Antepartum hemorrhage, unspecified, third trimester: Secondary | ICD-10-CM | POA: Diagnosis present

## 2021-07-06 LAB — CBC
HCT: 27.1 % — ABNORMAL LOW (ref 36.0–46.0)
Hemoglobin: 8.6 g/dL — ABNORMAL LOW (ref 12.0–15.0)
MCH: 26.8 pg (ref 26.0–34.0)
MCHC: 31.7 g/dL (ref 30.0–36.0)
MCV: 84.4 fL (ref 80.0–100.0)
Platelets: 287 10*3/uL (ref 150–400)
RBC: 3.21 MIL/uL — ABNORMAL LOW (ref 3.87–5.11)
RDW: 14.1 % (ref 11.5–15.5)
WBC: 7.7 10*3/uL (ref 4.0–10.5)
nRBC: 0 % (ref 0.0–0.2)

## 2021-07-06 LAB — CREATININE, SERUM
Creatinine, Ser: 0.82 mg/dL (ref 0.44–1.00)
GFR, Estimated: >60 mL/min (ref >60)

## 2021-07-06 LAB — PREPARE RBC (CROSSMATCH)

## 2021-07-06 LAB — RPR: RPR Ser Ql: NONREACTIVE

## 2021-07-06 SURGERY — Surgical Case
Anesthesia: Spinal | Site: Abdomen | Wound class: Clean Contaminated

## 2021-07-06 MED ORDER — ZOLPIDEM TARTRATE 5 MG PO TABS: 5.0000 mg | ORAL_TABLET | Freq: Every evening | ORAL | Status: DC | PRN

## 2021-07-06 MED ORDER — LACTATED RINGERS IV SOLN: INTRAVENOUS | Status: DC

## 2021-07-06 MED ORDER — DIBUCAINE (PERIANAL) 1 % EX OINT: 1.0000 "application " | TOPICAL_OINTMENT | CUTANEOUS | Status: DC | PRN

## 2021-07-06 MED ORDER — ACETAMINOPHEN 500 MG PO TABS
1000.0000 mg | ORAL_TABLET | Freq: Four times a day (QID) | ORAL | Status: DC
  Administered 2021-07-06 – 2021-07-08 (×7): 1000 mg via ORAL
  Filled 2021-07-06 (×7): qty 2

## 2021-07-06 MED ORDER — COCONUT OIL OIL: 1.0000 "application " | TOPICAL_OIL | Status: DC | PRN

## 2021-07-06 MED ORDER — WITCH HAZEL-GLYCERIN EX PADS: 1.0000 "application " | MEDICATED_PAD | CUTANEOUS | Status: DC | PRN

## 2021-07-06 MED ORDER — SENNOSIDES-DOCUSATE SODIUM 8.6-50 MG PO TABS
2.0000 | ORAL_TABLET | ORAL | Status: DC
  Administered 2021-07-07 – 2021-07-08 (×2): 2 via ORAL
  Filled 2021-07-06 (×2): qty 2

## 2021-07-06 MED ORDER — FENTANYL CITRATE (PF) 100 MCG/2ML IJ SOLN
INTRAMUSCULAR | Status: DC | PRN
  Administered 2021-07-06: 15 ug via INTRATHECAL

## 2021-07-06 MED ORDER — ACETAMINOPHEN 500 MG PO TABS: 1000.0000 mg | ORAL_TABLET | Freq: Four times a day (QID) | ORAL | Status: DC

## 2021-07-06 MED ORDER — SCOPOLAMINE 1 MG/3DAYS TD PT72
1.0000 | MEDICATED_PATCH | Freq: Once | TRANSDERMAL | Status: DC
Start: 2021-07-06 — End: 2021-07-08

## 2021-07-06 MED ORDER — FENTANYL CITRATE (PF) 100 MCG/2ML IJ SOLN
INTRAMUSCULAR | Status: AC
  Filled 2021-07-06: qty 2

## 2021-07-06 MED ORDER — DIPHENHYDRAMINE HCL 25 MG PO CAPS: 25.0000 mg | ORAL_CAPSULE | ORAL | Status: DC | PRN

## 2021-07-06 MED ORDER — SCOPOLAMINE 1 MG/3DAYS TD PT72
MEDICATED_PATCH | TRANSDERMAL | Status: DC | PRN
  Administered 2021-07-06: 1 via TRANSDERMAL

## 2021-07-06 MED ORDER — PHENYLEPHRINE HCL-NACL 20-0.9 MG/250ML-% IV SOLN
INTRAVENOUS | Status: AC
  Filled 2021-07-06: qty 250

## 2021-07-06 MED ORDER — KETOROLAC TROMETHAMINE 30 MG/ML IJ SOLN
INTRAMUSCULAR | Status: AC
  Filled 2021-07-06: qty 1

## 2021-07-06 MED ORDER — MEPERIDINE HCL 25 MG/ML IJ SOLN: 6.2500 mg | INTRAMUSCULAR | Status: DC | PRN

## 2021-07-06 MED ORDER — MORPHINE SULFATE (PF) 0.5 MG/ML IJ SOLN
INTRAMUSCULAR | Status: AC
  Filled 2021-07-06: qty 10

## 2021-07-06 MED ORDER — MEASLES, MUMPS & RUBELLA VAC IJ SOLR: 0.5000 mL | Freq: Once | INTRAMUSCULAR | Status: DC

## 2021-07-06 MED ORDER — DEXAMETHASONE SODIUM PHOSPHATE 10 MG/ML IJ SOLN
INTRAMUSCULAR | Status: DC | PRN
  Administered 2021-07-06: 10 mg via INTRAVENOUS

## 2021-07-06 MED ORDER — SIMETHICONE 80 MG PO CHEW
80.0000 mg | CHEWABLE_TABLET | Freq: Three times a day (TID) | ORAL | Status: DC
  Administered 2021-07-07 – 2021-07-08 (×5): 80 mg via ORAL
  Filled 2021-07-06 (×5): qty 1

## 2021-07-06 MED ORDER — OXYTOCIN-SODIUM CHLORIDE 30-0.9 UT/500ML-% IV SOLN: 2.5000 [IU]/h | INTRAVENOUS | Status: AC

## 2021-07-06 MED ORDER — MORPHINE SULFATE (PF) 0.5 MG/ML IJ SOLN
INTRAMUSCULAR | Status: DC | PRN
  Administered 2021-07-06: .15 mg via INTRATHECAL

## 2021-07-06 MED ORDER — ENOXAPARIN SODIUM 40 MG/0.4ML IJ SOSY
40.0000 mg | PREFILLED_SYRINGE | INTRAMUSCULAR | Status: DC
  Administered 2021-07-07 – 2021-07-08 (×2): 40 mg via SUBCUTANEOUS
  Filled 2021-07-06 (×2): qty 0.4

## 2021-07-06 MED ORDER — PRENATAL MULTIVITAMIN CH
1.0000 | ORAL_TABLET | Freq: Every day | ORAL | Status: DC
  Administered 2021-07-07 – 2021-07-08 (×2): 1 via ORAL
  Filled 2021-07-06 (×2): qty 1

## 2021-07-06 MED ORDER — IBUPROFEN 600 MG PO TABS: 600.0000 mg | ORAL_TABLET | Freq: Four times a day (QID) | ORAL | Status: DC | PRN

## 2021-07-06 MED ORDER — OXYTOCIN-SODIUM CHLORIDE 30-0.9 UT/500ML-% IV SOLN
INTRAVENOUS | Status: AC
  Filled 2021-07-06: qty 500

## 2021-07-06 MED ORDER — HYDROMORPHONE HCL 1 MG/ML IJ SOLN: 0.2500 mg | INTRAMUSCULAR | Status: DC | PRN

## 2021-07-06 MED ORDER — BUPIVACAINE IN DEXTROSE 0.75-8.25 % IT SOLN
INTRATHECAL | Status: DC | PRN
Start: 2021-07-06 — End: 2021-07-06
  Administered 2021-07-06: 1.6 mL via INTRATHECAL

## 2021-07-06 MED ORDER — POVIDONE-IODINE 10 % EX SWAB
2.0000 "application " | Freq: Once | CUTANEOUS | Status: AC
  Administered 2021-07-06: 2 via TOPICAL

## 2021-07-06 MED ORDER — ACETAMINOPHEN 10 MG/ML IV SOLN
1000.0000 mg | Freq: Four times a day (QID) | INTRAVENOUS | Status: DC
  Administered 2021-07-06: 1000 mg via INTRAVENOUS
  Filled 2021-07-06 (×3): qty 100

## 2021-07-06 MED ORDER — KETOROLAC TROMETHAMINE 30 MG/ML IJ SOLN
30.0000 mg | Freq: Once | INTRAMUSCULAR | Status: AC | PRN
  Administered 2021-07-06: 30 mg via INTRAVENOUS

## 2021-07-06 MED ORDER — SOD CITRATE-CITRIC ACID 500-334 MG/5ML PO SOLN
30.0000 mL | ORAL | Status: AC
  Administered 2021-07-06: 30 mL via ORAL
  Filled 2021-07-06: qty 30

## 2021-07-06 MED ORDER — BISACODYL 10 MG RE SUPP
10.0000 mg | Freq: Every day | RECTAL | Status: DC | PRN
Start: 2021-07-06 — End: 2021-07-08

## 2021-07-06 MED ORDER — NALOXONE HCL 0.4 MG/ML IJ SOLN: 0.4000 mg | INTRAMUSCULAR | Status: DC | PRN

## 2021-07-06 MED ORDER — MORPHINE SULFATE (PF) 0.5 MG/ML IJ SOLN
INTRAMUSCULAR | Status: DC | PRN
Start: 2021-07-06 — End: 2021-07-06

## 2021-07-06 MED ORDER — PHENYLEPHRINE HCL-NACL 20-0.9 MG/250ML-% IV SOLN
INTRAVENOUS | Status: DC | PRN
  Administered 2021-07-06: 60 ug/min via INTRAVENOUS

## 2021-07-06 MED ORDER — OXYTOCIN-SODIUM CHLORIDE 30-0.9 UT/500ML-% IV SOLN
INTRAVENOUS | Status: DC | PRN
  Administered 2021-07-06: 30 [IU] via INTRAVENOUS

## 2021-07-06 MED ORDER — NALOXONE HCL 4 MG/10ML IJ SOLN
1.0000 ug/kg/h | INTRAVENOUS | Status: DC | PRN
  Filled 2021-07-06: qty 5

## 2021-07-06 MED ORDER — ONDANSETRON HCL 4 MG/2ML IJ SOLN
INTRAMUSCULAR | Status: DC | PRN
  Administered 2021-07-06: 4 mg via INTRAVENOUS

## 2021-07-06 MED ORDER — ONDANSETRON HCL 4 MG/2ML IJ SOLN
INTRAMUSCULAR | Status: AC
  Filled 2021-07-06: qty 2

## 2021-07-06 MED ORDER — SODIUM CHLORIDE 0.9% FLUSH: 3.0000 mL | INTRAVENOUS | Status: DC | PRN

## 2021-07-06 MED ORDER — OXYCODONE HCL 5 MG PO TABS: 5.0000 mg | ORAL_TABLET | Freq: Four times a day (QID) | ORAL | Status: DC | PRN

## 2021-07-06 MED ORDER — SODIUM CHLORIDE 0.9% IV SOLUTION: Freq: Once | INTRAVENOUS | Status: DC

## 2021-07-06 MED ORDER — ACETAMINOPHEN 10 MG/ML IV SOLN
INTRAVENOUS | Status: AC
  Filled 2021-07-06: qty 100

## 2021-07-06 MED ORDER — DIPHENHYDRAMINE HCL 50 MG/ML IJ SOLN: 12.5000 mg | INTRAMUSCULAR | Status: DC | PRN

## 2021-07-06 MED ORDER — DIPHENHYDRAMINE HCL 25 MG PO CAPS: 25.0000 mg | ORAL_CAPSULE | Freq: Four times a day (QID) | ORAL | Status: DC | PRN

## 2021-07-06 MED ORDER — SIMETHICONE 80 MG PO CHEW: 80.0000 mg | CHEWABLE_TABLET | ORAL | Status: DC | PRN

## 2021-07-06 MED ORDER — DEXAMETHASONE SODIUM PHOSPHATE 4 MG/ML IJ SOLN
INTRAMUSCULAR | Status: AC
  Filled 2021-07-06: qty 1

## 2021-07-06 MED ORDER — OXYCODONE HCL 5 MG PO TABS: 5.0000 mg | ORAL_TABLET | ORAL | Status: DC | PRN

## 2021-07-06 MED ORDER — MENTHOL 3 MG MT LOZG: 1.0000 | LOZENGE | OROMUCOSAL | Status: DC | PRN

## 2021-07-06 MED ORDER — FLEET ENEMA 7-19 GM/118ML RE ENEM: 1.0000 | ENEMA | Freq: Every day | RECTAL | Status: DC | PRN

## 2021-07-06 MED ORDER — CEFAZOLIN SODIUM-DEXTROSE 2-4 GM/100ML-% IV SOLN
2.0000 g | INTRAVENOUS | Status: AC
  Administered 2021-07-06: 2 g via INTRAVENOUS
  Filled 2021-07-06: qty 100

## 2021-07-06 MED ORDER — IBUPROFEN 600 MG PO TABS
600.0000 mg | ORAL_TABLET | Freq: Four times a day (QID) | ORAL | Status: DC
  Administered 2021-07-06 – 2021-07-08 (×7): 600 mg via ORAL
  Filled 2021-07-06 (×7): qty 1

## 2021-07-06 MED ORDER — ONDANSETRON HCL 4 MG/2ML IJ SOLN: 4.0000 mg | Freq: Once | INTRAMUSCULAR | Status: DC | PRN

## 2021-07-06 MED ORDER — ONDANSETRON HCL 4 MG/2ML IJ SOLN
4.0000 mg | Freq: Three times a day (TID) | INTRAMUSCULAR | Status: DC | PRN
  Administered 2021-07-06: 4 mg via INTRAVENOUS
  Filled 2021-07-06: qty 2

## 2021-07-06 SURGICAL SUPPLY — 29 items
BENZOIN TINCTURE PRP APPL 2/3 (GAUZE/BANDAGES/DRESSINGS) ×2 IMPLANT
CHLORAPREP W/TINT 26ML (MISCELLANEOUS) ×2 IMPLANT
CLOTH BEACON ORANGE TIMEOUT ST (SAFETY) ×2 IMPLANT
DERMABOND ADVANCED (GAUZE/BANDAGES/DRESSINGS) ×1
DERMABOND ADVANCED .7 DNX12 (GAUZE/BANDAGES/DRESSINGS) IMPLANT
DRSG OPSITE POSTOP 4X10 (GAUZE/BANDAGES/DRESSINGS) ×2 IMPLANT
ELECT REM PT RETURN 9FT ADLT (ELECTROSURGICAL) ×2
ELECTRODE REM PT RTRN 9FT ADLT (ELECTROSURGICAL) ×1 IMPLANT
GAUZE SPONGE 4X4 12PLY STRL LF (GAUZE/BANDAGES/DRESSINGS) ×2 IMPLANT
GLOVE BIOGEL PI IND STRL 7.0 (GLOVE) ×3 IMPLANT
GLOVE BIOGEL PI INDICATOR 7.0 (GLOVE) ×3
GLOVE ECLIPSE 6.5 STRL STRAW (GLOVE) ×2 IMPLANT
GOWN STRL REUS W/ TWL LRG LVL3 (GOWN DISPOSABLE) ×2 IMPLANT
GOWN STRL REUS W/TWL LRG LVL3 (GOWN DISPOSABLE) ×2
NS IRRIG 1000ML POUR BTL (IV SOLUTION) ×2 IMPLANT
PAD ABD 7.5X8 STRL (GAUZE/BANDAGES/DRESSINGS) ×1 IMPLANT
PAD OB MATERNITY 4.3X12.25 (PERSONAL CARE ITEMS) ×2 IMPLANT
PAD PREP 24X48 CUFFED NSTRL (MISCELLANEOUS) ×2 IMPLANT
RETRACTOR WND ALEXIS 25 LRG (MISCELLANEOUS) IMPLANT
RTRCTR WOUND ALEXIS 25CM LRG (MISCELLANEOUS)
STRIP CLOSURE SKIN 1/2X4 (GAUZE/BANDAGES/DRESSINGS) ×2 IMPLANT
SUT PLAIN 2 0 XLH (SUTURE) ×2 IMPLANT
SUT VIC AB 0 CT1 36 (SUTURE) ×4 IMPLANT
SUT VIC AB 2-0 CT1 27 (SUTURE) ×1
SUT VIC AB 2-0 CT1 TAPERPNT 27 (SUTURE) ×1 IMPLANT
SUT VIC AB 4-0 KS 27 (SUTURE) ×2 IMPLANT
TOWEL OR 17X24 6PK STRL BLUE (TOWEL DISPOSABLE) ×6 IMPLANT
TRAY FOLEY CATH SILVER 16FR (SET/KITS/TRAYS/PACK) ×2 IMPLANT
WATER STERILE IRR 1000ML POUR (IV SOLUTION) ×2 IMPLANT

## 2021-07-06 NOTE — Anesthesia Postprocedure Evaluation (Signed)
Anesthesia Post Note ? ?Patient: Rhyse Skowron ? ?Procedure(s) Performed: CESAREAN SECTION (Abdomen) ? ?  ? ?Patient location during evaluation: PACU ?Anesthesia Type: Spinal ?Level of consciousness: awake ?Pain management: pain level controlled ?Vital Signs Assessment: post-procedure vital signs reviewed and stable ?Respiratory status: spontaneous breathing ?Cardiovascular status: stable ?Postop Assessment: no headache, no backache, spinal receding, patient able to bend at knees and no apparent nausea or vomiting ?Anesthetic complications: no ? ? ?No notable events documented. ? ?Last Vitals:  ?Vitals:  ? 07/06/21 1545 07/06/21 1600  ?BP: 105/60 (!) 102/56  ?Pulse: 75 76  ?Resp: (!) 26 (!) 25  ?Temp:    ?SpO2: 100% 100%  ?  ?Last Pain:  ?Vitals:  ? 07/06/21 1600  ?TempSrc:   ?PainSc: 3   ? ?Pain Goal: Patients Stated Pain Goal: 3 (07/06/21 1445) ? ?  ?  ?  ?  ?  ?  ?  ? ?Caren Macadam ? ? ? ? ?

## 2021-07-06 NOTE — H&P (Signed)
Valerie Taylor is a 36 y.o. female presenting for cesarean section for placenta previa with vaginal bleeding yesterday. [id number] ?[redacted]w[redacted]d ?. ?OB History   ? ? Gravida  ?5  ? Para  ?1  ? Term  ?1  ? Preterm  ?   ? AB  ?3  ? Living  ?1  ?  ? ? SAB  ?1  ? IAB  ?   ? Ectopic  ?2  ? Multiple  ?0  ? Live Births  ?1  ?   ?  ? Obstetric Comments  ?2004 per pt   ?  ? ?  ? ?Past Medical History:  ?Diagnosis Date  ? Headache   ? History of ectopic pregnancy 12/06/2017  ? History of pregnancy loss in prior pregnancy, currently pregnant 12/06/2017  ? 4 months loss- patient unclear of events Records requested  ? Infection   ? UTI  ? Kidney stones   ? Yeast dermatitis 06/02/2020  ? ?Past Surgical History:  ?Procedure Laterality Date  ? DIAGNOSTIC LAPAROSCOPY WITH REMOVAL OF ECTOPIC PREGNANCY N/A 01/13/2020  ? Procedure: DIAGNOSTIC LAPAROSCOPY WITH REMOVAL OF ECTOPIC PREGNANCY AND LEFT SALPINGECTOMY;  Surgeon: Hermina Staggers, MD;  Location: [facility];  Service: Gynecology;  Laterality: N/A;  ? ?Family History: family history includes Alzheimer's disease in her maternal grandmother; Asthma in her sister; Cancer in her maternal aunt; Healthy in her father and mother; Hypertension in her maternal grandmother. ?Social History:  reports that she has never smoked. She has never used smokeless tobacco. She reports that she does not drink alcohol and does not use drugs. ? ? ?  ?Maternal Diabetes: No ?Genetic Screening: Normal ?Maternal Ultrasounds/Referrals: Normal ?Fetal Ultrasounds or other Referrals:  Other: pyelectasis ?Maternal Substance Abuse:  No ?Significant Maternal Medications:  None ?Significant Maternal Lab Results:  None ?Other Comments:  None ? ?Review of Systems  ?Constitutional: Negative.   ?Respiratory: Negative.    ?Gastrointestinal: Negative.   ?Genitourinary:  Positive for vaginal bleeding (noted yesterday).  ?Musculoskeletal: Negative.   ?Maternal Medical History:  ?Reason for admission: Vaginal bleeding.  ? ?Fetal activity:  Perceived fetal activity is normal.   ?Prenatal complications: Bleeding and placental abnormality (previa).   ?Prenatal Complications - Diabetes: none. ? ?  ?Blood pressure 113/71, pulse 92, temperature 97.6 ?F (36.4 ?C), resp. rate 17, height 5\' 4"  (1.626 m), weight 86.6 kg, last menstrual period 10/22/2020, SpO2 100 %, unknown if currently breastfeeding. ?Maternal Exam:  ?Abdomen: Patient reports no abdominal tenderness. Introitus: not evaluated.   ?Cervix: not evaluated. ? ?Physical Exam ?Vitals and nursing note reviewed. Exam conducted with a chaperone present.  ?Constitutional:   ?   Appearance: Normal appearance.  ?HENT:  ?   Head: Normocephalic and atraumatic.  ?Cardiovascular:  ?   Rate and Rhythm: Normal rate.  ?Pulmonary:  ?   Effort: Pulmonary effort is normal.  ?Abdominal:  ?   Palpations: Abdomen is soft.  ?   Tenderness: There is no abdominal tenderness.  ?Musculoskeletal:  ?   Cervical back: Normal range of motion.  ?Neurological:  ?   Mental Status: She is alert.  ?Psychiatric:     ?   Mood and Affect: Mood normal.     ?   Behavior: Behavior normal.  ?  ?Prenatal labs: ?ABO, Rh: B/Positive/-- (10/11 1603) ?Antibody: Negative (10/11 1603) ?Rubella: 5.97 (10/11 1603) ?RPR: Non Reactive (01/31 0934)  ?HBsAg: Negative (10/11 1603)  ?HIV: Non Reactive (01/31 0934)  ?GBS:    ? ?Assessment/Plan: ?Placenta previa with  recent bleeding for cesarean section at [redacted]w[redacted]d ?The risks of surgery were discussed with the patient including but were not limited to: bleeding which may require transfusion or reoperation; infection which may require antibiotics; injury to bowel, bladder, ureters or other surrounding organs; injury to the fetus; need for additional procedures including hysterectomy in the event of a life-threatening hemorrhage; formation of adhesions; placental abnormalities with subsequent pregnancies; incisional problems; thromboembolic phenomenon and other postoperative/anesthesia complications.  The  patient concurred with the proposed plan, giving informed written consent for the procedure.   Patient has been NPO since 0030 she will remain NPO for procedure. Anesthesia and OR aware. Preoperative prophylactic antibiotics and SCDs ordered on call to the OR.   ?   ? ?Scheryl Darter ?07/06/2021, 2:05 AM ? ? ? ? ?

## 2021-07-06 NOTE — Progress Notes (Signed)
Valerie Taylor is a [id number] at [redacted]w[redacted]d  ? ?The risks of cesarean section were discussed with the patient including but were not limited to: bleeding which may require transfusion or reoperation; infection which may require antibiotics; injury to bowel, bladder, ureters or other surrounding organs; injury to the fetus; need for additional procedures including hysterectomy in the event of a life-threatening hemorrhage; placental abnormalities wth subsequent pregnancies, incisional problems, thromboembolic phenomenon and other postoperative/anesthesia complications.  Patient also desires permanent sterilization.  Other reversible forms of contraception were discussed with patient; she declines all other modalities. Risks of procedure discussed with patient including but not limited to: risk of regret, permanence of method, bleeding, infection, injury to surrounding organs and need for additional procedures.  Failure risk of 1-2% with increased risk of ectopic gestation if pregnancy occurs was also discussed with patient.  The patient concurred with the proposed plan, giving informed written consent for the procedures.  Patient has been NPO since 0130 she will remain NPO for procedure. Anesthesia and OR aware.  Preoperative prophylactic antibiotics and SCDs ordered on call to the OR.  To OR when ready. ? ?Federico Flake, MD, MPH, ABFM, IBCLC ?Attending Physician ?[facility] ? ?

## 2021-07-06 NOTE — Transfer of Care (Addendum)
Immediate Anesthesia Transfer of Care Note ? ?Patient: Valerie Taylor ? ?Procedure(s) Performed: CESAREAN SECTION (Abdomen) ? ?Patient Location: PACU ? ?Anesthesia Type:Spinal ? ?Level of Consciousness: awake, alert , oriented and patient cooperative ? ?Airway & Oxygen Therapy: Patient Spontanous Breathing ? ?Post-op Assessment: Report given to RN and Post -op Vital signs reviewed and stable ? ?Post vital signs: Reviewed and stable ? ?Last Vitals:  ?Vitals Value Taken Time  ?BP 102/56 07/06/21 1600  ?Temp 36.6 ?C 07/06/21 1515  ?Pulse 73 07/06/21 1612  ?Resp 22 07/06/21 1612  ?SpO2 100 % 07/06/21 1612  ?Vitals shown include unvalidated device data. ? ?Last Pain:  ?Vitals:  ? 07/06/21 1600  ?TempSrc:   ?PainSc: 3   ?   ? ?Patients Stated Pain Goal: 3 (07/06/21 1445) ? ?Complications: No notable events documented. ?

## 2021-07-06 NOTE — Anesthesia Preprocedure Evaluation (Signed)
Anesthesia Evaluation  ?Patient identified by MRN, date of birth, ID band ?Patient awake ? ? ? ?Reviewed: ?Allergy & Precautions, H&P , NPO status , Patient's Chart, lab work & pertinent test results ? ?Airway ?Mallampati: I ? ? ? ? ? ? Dental ?no notable dental hx. ? ?  ?Pulmonary ?neg pulmonary ROS,  ?  ?Pulmonary exam normal ? ? ? ? ? ? ? Cardiovascular ?negative cardio ROS ?Normal cardiovascular exam ? ? ?  ?Neuro/Psych ?negative neurological ROS ? negative psych ROS  ? GI/Hepatic ?negative GI ROS, Neg liver ROS,   ?Endo/Other  ?negative endocrine ROS ? Renal/GU ?  ? ?  ?Musculoskeletal ?negative musculoskeletal ROS ?(+)  ? Abdominal ?Normal abdominal exam  (+)   ?Peds ? Hematology ?negative hematology ROS ?(+)   ?Anesthesia Other Findings ? ? Reproductive/Obstetrics ?(+) Pregnancy ? ?  ? ? ? ? ? ? ? ? ? ? ? ? ? ?  ?  ? ? ? ? ? ? ? ? ?Anesthesia Physical ?Anesthesia Plan ? ?ASA: 2 ? ?Anesthesia Plan: Spinal  ? ?Post-op Pain Management:   ? ?Induction:  ? ?PONV Risk Score and Plan: 3 and Ondansetron, Dexamethasone and Scopolamine patch - Pre-op ? ?Airway Management Planned: Natural Airway, Simple Face Mask and Nasal Cannula ? ?Additional Equipment: None ? ?Intra-op Plan:  ? ?Post-operative Plan:  ? ?Informed Consent: I have reviewed the patients History and Physical, chart, labs and discussed the procedure including the risks, benefits and alternatives for the proposed anesthesia with the patient or authorized representative who has indicated his/her understanding and acceptance.  ? ? ? ? ? ?Plan Discussed with: CRNA ? ?Anesthesia Plan Comments:   ? ? ? ? ? ? ?Anesthesia Quick Evaluation ? ?

## 2021-07-06 NOTE — MAU Note (Signed)
Pt is G5P1 at 36wk5d with hx of placenta previa. Had small amt of dark bleeding yesterday am. Was told to come in yesterday afternoon but could not due to childcare issues. Denies anymore vag bleeding and no pain. Good FM. Ate chicken strips at 0037 and just drank soda.  ?

## 2021-07-06 NOTE — Lactation Note (Signed)
This note was copied from a baby's chart. ?Lactation Consultation Note ? ?Patient Name: Valerie Taylor ?Today's Date: 07/06/2021 ?Reason for consult: Follow-up assessment;Late-preterm 34-36.6wks ?Age:36 hours ? ?Reason for consult: Follow-up visit. Mom stated that baby has been latching well. Baby is currently in the nursery at the time of visit. [facility] student provided green sheet from late preterm infant. ?Skin  to skin, preserving baby energy, feeding baby no longer than 30 minutes, increasing stimulation, and other recommendations. LC student set up a DEBP and showed mom how to use the pump. [facility] student education mom about pumping and storage.  ? ?Mom Plan: ?Mom will latch baby 8-12 times within 24hrs and continue skin to skin. ?Mom will pump for ?Mom will call for [facility] assistance if needed.   ? ? ?Maternal Data ?Has patient been taught Hand Expression?: Yes ?Does the patient have breastfeeding experience prior to this delivery?: Yes ?How long did the patient breastfeed?: 2years ? ?Feeding ?Mother's Current Feeding Choice: Breast Milk ? ? ?Interventions ?Interventions: DEBP ? ?Discharge ?Pump: DEBP;Personal ?WIC Program: No ? ?Consult Status ?Consult Status: Follow-up ?Date: 07/07/21 ? ? ? ?Kharson Rasmusson Ladona Ridgel ?07/06/2021, 10:32 PM ? ? ? ?

## 2021-07-06 NOTE — Progress Notes (Signed)
[employer] denied Prior Auth for Pantoprazole Sodium 40 mg tablet. ?

## 2021-07-06 NOTE — Discharge Summary (Signed)
? ?  Postpartum Discharge Summary ? ?   ?Patient Name: Valerie Taylor ?DOB: July 03, 1985 ?MRN: [id number] ? ?Date of admission: 07/06/2021 ?Delivery date:07/06/2021  ?Delivering provider: Lauretta Chester NILES  ?Date of discharge: 07/08/2021 ? ?Admitting diagnosis: Placenta previa antepartum [O44.00] ?S/P primary low transverse C-section [M33.612] ?Intrauterine pregnancy: [redacted]w[redacted]d    ?Secondary diagnosis:  Principal Problem: ?  Placenta previa antepartum ?Active Problems: ?  Placenta previa, posterior, third trimester ?  H/O bilateral salpingectomy ?  Supervision of other normal pregnancy, antepartum ?  Fetal bilateral urinary tract dilations (UTD) on ultrasound ?  S/P primary low transverse C-section ?  Vaginal bleeding in pregnancy, third trimester ? ?Additional problems: Acute blood loss anemia; s/p 1 U pRBCs and IV Venofer     ?Discharge diagnosis: Term Pregnancy Delivered                                              ?Post partum procedures: BTS ?Augmentation: N/A ?Complications: None ? ?Hospital course: Sceduled C/S - 36y.o. yo [id number] admitted to the hospital 07/06/2021 for scheduled cesarean section with the following indication: Placenta previa with vaginal bleeding . Delivery details are as follows:  ?Membrane Rupture Time/Date: 1:34 PM ,07/06/2021   ?Delivery Method:C-Section, Low Transverse  ?Details of operation can be found in separate operative note.  Patient had an uncomplicated postpartum course.  She received 1 U of pRBCs and IV Venofer for acute blood loss anemia with good result. Her last hemoglobin was 7.9 prior to discharge and she remained asymptomatic while inpatient. She is ambulating, tolerating a regular diet, passing flatus, and urinating well. Patient is discharged home in stable condition on  07/08/21 ?       ?Newborn Data: ?Birth date:07/06/2021  ?Birth time:1:35 PM  ?Gender:Female  ?Living status:Living  ?Apgars:8 ,9  ?Weight:2900 g    ? ?Magnesium Sulfate received: No ?BMZ received:  No ?Rhophylac: N/A ?MMR: N/A ?T-DaP: Given prenatally ?Flu: No ?Transfusion: Yes ? ?Physical exam  ?Vitals:  ? 07/07/21 0430 07/07/21 0859 07/07/21 2001 07/08/21 0557  ?BP: 98/64 (!) 89/57 (!) 104/58 (!) 100/55  ?Pulse: 71 73 71 76  ?Resp: _0 ?Temp: 97.8 ?F (36.6 ?C) 98.4 ?F (36.9 ?C) 98.5 ?F (36.9 ?C) 98 ?F (36.7 ?C)  ?TempSrc: Oral Oral Oral   ?SpO2: 98% 100% 100% 100%  ?Weight:      ?Height:      ? ?General: alert, cooperative, and no distress ?Lochia: appropriate ?Uterine Fundus: firm and below umbilicus  ?Incision: healing well with no significant drainage, no significant erythema, dressing is clean, dry, and intact ?DVT Evaluation: no LE edema or calf tenderness to palpation  ? ?Labs: ?Lab Results  ?Component Value Date  ? WBC 13.7 (H) 07/07/2021  ? HGB 7.9 (L) 07/07/2021  ? HCT 24.7 (L) 07/07/2021  ? MCV 82.1 07/07/2021  ? PLT 230 07/07/2021  ? ? ?  Latest Ref Rng & Units 07/06/2021  ?  5:32 PM  ?CMP  ?Creatinine 0.44 - 1.00 mg/dL 0.82    ? ?Edinburgh Score: ? ?  07/06/2021  ?  4:45 PM  ?Edinburgh Postnatal Depression Scale Screening Tool  ?I have been able to laugh and see the funny side of things. 0  ?I have looked forward with enjoyment to things. 0  ?I have blamed myself unnecessarily when things went wrong.  0  ?I have been anxious or worried for no good reason. 0  ?I have felt scared or panicky for no good reason. 0  ?Things have been getting on top of me. 0  ?I have been so unhappy that I have had difficulty sleeping. 0  ?I have felt sad or miserable. 0  ?I have been so unhappy that I have been crying. 0  ?The thought of harming myself has occurred to me. 0  ?Edinburgh Postnatal Depression Scale Total 0  ? ? ? ?After visit meds:  ?Allergies as of 07/08/2021   ? ?   Reactions  ? Latex   ? "Only when inserted in vagina", I.e. condoms, ? Spermicide or lubricant.  Causes vaginal swelling ?OK with Latex gloves  ? ?  ? ?  ?Medication List  ?  ? ?STOP taking these medications   ? ?Aspirin 81 MG Caps ?   ?pantoprazole 40 MG tablet ?Commonly known as: Protonix ?  ? ?  ? ?TAKE these medications   ? ?acetaminophen 500 MG tablet ?Commonly known as: TYLENOL ?Take 2 tablets (1,000 mg total) by mouth every 8 (eight) hours as needed (pain). ?  ?Blood Pressure Kit Devi ?1 kit by Does not apply route once a week. ?  ?ferrous sulfate 325 (65 FE) MG tablet ?Take 1 tablet (325 mg total) by mouth every other day. ?  ?ibuprofen 600 MG tablet ?Commonly known as: ADVIL ?Take 1 tablet (600 mg total) by mouth every 6 (six) hours as needed (pain). ?  ?PRENATAL GUMMIES PO ?Take by mouth. ?  ? ?  ? ?  ?  ? ? ?  ?Discharge Care Instructions  ?(From admission, onward)  ?  ? ? ?  ? ?  Start     Ordered  ? 07/08/21 0000  Discharge wound care:       ?Comments: Remove dressing 5 days after your surgery date. You can then wash the area gently with soap and water and pat dry. You will have an incision check in about 1 week.  ? 07/08/21 1017  ? ?  ?  ? ?  ? ? ? ?Discharge home in stable condition ?Infant Feeding: Breast ?Infant Disposition: home with mother ?Discharge instruction: per After Visit Summary and Postpartum booklet. ?Activity: Advance as tolerated. Pelvic rest for 6 weeks.  ?Diet: routine diet ?Future Appointments: ?Future Appointments  ?Date Time Provider [place]  ?07/13/2021 10:40 AM [facility] NURSE [facility] None  ?08/04/2021 10:55 AM Griffin Basil, MD [facility] None  ? ?Follow up Visit: ?Message sent to [facility] by Dr. Cy Blamer on 4/5 ? ?Please schedule this patient for a In person postpartum visit in 4 weeks with the following provider: Any provider. ?Additional Postpartum F/U: Incision check 1 week  ?High risk pregnancy complicated by:  placenta previa ?Delivery mode:  C-Section, Low Transverse  ?Anticipated Birth Control:  Right salpingectomy done, hx of prior left salpingectomy due to ectopic pregnancy in 01/2020 ? ?07/08/2021 ?Genia Del, MD ? ? ? ?

## 2021-07-06 NOTE — Op Note (Addendum)
Operative Note  ? ?Patient: Valerie Taylor ? ?Date of Procedure: 07/06/2021 ? ?Procedure: Primary Low Transverse Cesarean and right salpingectomy ? ?Indications:  Placenta previa, bleeding ? ?Pre-operative Diagnosis: Placenta previa, primary cesarean section, desires sterilization.  ? ?Post-operative Diagnosis: Same ? ?TOLAC Candidate: No ? ?Surgeon: Moishe Spice) and Role: ?   Federico Flake, MD - Primary ?   Warner Mccreedy, MD - Fellow ? ? ?An experienced assistant was required given the standard of surgical care given the complexity of the case.  This assistant was needed for exposure, dissection, suctioning, retraction, instrument exchange, assisting with delivery with administration of fundal pressure, and for overall help during the procedure.  ? ?Anesthesia: spinal ? ?Anesthesiologist: Leilani Able, MD  ? ?Antibiotics: Cefazolin ?  ?Estimated Blood Loss: 730 ml  ? ?Total IV Fluids: 1200 ml ? ?Urine Output:  400 cc OF clear urine ? ?Specimens: right fallopian tube sent to pathology, placenta found to be intact and sent to L&D  ? ?Complications: no complications  ? ?Indications: ?Valerie Taylor is a 36 y.o. [phone] with an IUP [redacted]w[redacted]d presenting for unscheduled, urgent cesarean secondary to the indications listed above. Clinical course notable for patient presented to office visit with vaginal bleeding in setting of a placenta previa and therefore she was recommended to come in for a cesarean section. ? ? ?Findings: Viable female infant in cephalic presentation, no nuchal cord present. Apgars 8,9,.  Weight pending   Clear amniotic fluid. Normal placenta, three vessel cord. Normal uterus, right fallopian tube visualized and salpingectomy performed, left fallopian tube remnant visualized (had prior salpingectomy in setting of ectopic pregnancy)Normal bilateral ovaries. ? ?Procedure Details: A Time Out was held and the above information confirmed. The patient received intravenous antibiotics and had  sequential compression devices applied to her lower extremities preoperatively. The patient was taken back to the operative suite where spinal anesthesia was administered. After induction of anesthesia, the patient was draped and prepped in the usual sterile manner and placed in a dorsal supine position with a leftward tilt. A low transverse skin incision was made with scalpel and carried down through the subcutaneous tissue to the fascia. Fascial incision was made and extended transversely. The fascia was separated from the underlying rectus tissue superiorly and inferiorly. The rectus muscles were separated in the midline bluntly and the peritoneum was entered bluntly. An Alexis retractor was placed to aid in visualization of the uterus. A bladder flap was not developed. A low transverse uterine incision was made. The infant was successfully delivered from cephalic presentation, the umbilical cord was clamped after 1 minute. Cord ph was not sent, and cord blood was obtained for evaluation. The placenta was removed Intact and appeared normal. The uterine incision was closed with running locked sutures of 0-Vicryl. Overall, excellent hemostasis was noted.  ? ?Attention was then turned to the right fallopian tube. Right salpingectomy: A Kelly clamp was placed across the left fallopian tube taking care to incorporate the fimbriae. A second clamp was then placed below the first. The pedicle was then ligated with 2-0 plain gut free tie and the second clamp was removed with excellent hemostasis noted. The fallopian tube was then removed with Metzenbaum scissors. Then a second ligature of 2-0 plain gut free tie was placed below the remaining clamp, the clamp was then removed and again excellent hemostasis was observed. The left mesosalpinx and adnexa was then examined and the left tube remnant seen. ? ?The uterine incision was examined again and a small  amount of bleeding noted at the right edge of hysterotomy. Two figure  of eight stitches done and excellent hemostasis noted following the placement of sutures. ? ?The abdomen and the pelvis were cleared of all clot and debris and the Jon Gills was removed. Hemostasis was confirmed on all surfaces.  The peritoneum was reapproximated using 2-0 vicryl . The fascia was then closed using 0 Vicryl in a running fashion. The subcutaneous layer was reapproximated with plain gut and the skin was closed with a 4-0 vicryl subcuticular stitch. The patient tolerated the procedure well. Sponge, lap, instrument and needle counts were correct x 2. She was taken to the recovery room in stable condition. ? ?Disposition: PACU - hemodynamically stable.  ? ? ?Signed: ?Warner Mccreedy, MD, MPH ?[facility] for [facility]) ? ?Attestation of Attending Supervision of OB Fellow: Evaluation and management procedures were performed by the Family Medicine OB Fellow under my supervision.  I have reviewed the Fellow's note and chart. I was gloved and gowned for the entirety of the procedure and involved during the case. I have made any necessary editorial changes.  ? ? ?Federico Flake, MD, MPH, ABFM ?Attending Physician ?[facility] ? ?

## 2021-07-06 NOTE — Anesthesia Procedure Notes (Signed)
Spinal ? ?Patient location during procedure: OR ?Start time: 07/06/2021 1:09 PM ?End time: 07/06/2021 1:12 PM ?Reason for block: surgical anesthesia ?Staffing ?Performed: anesthesiologist  ?Anesthesiologist: Leilani Able, MD ?Preanesthetic Checklist ?Completed: patient identified, IV checked, site marked, risks and benefits discussed, surgical consent, monitors and equipment checked, pre-op evaluation and timeout performed ?Spinal Block ?Patient position: sitting ?Prep: DuraPrep and site prepped and draped ?Patient monitoring: continuous pulse ox and blood pressure ?Approach: midline ?Location: L3-4 ?Injection technique: single-shot ?Needle ?Needle type: Pencan  ?Needle gauge: 24 G ?Needle length: 10 cm ?Needle insertion depth: 6 cm ?Assessment ?Sensory level: T4 ?Events: CSF return ? ? ? ?

## 2021-07-06 NOTE — Lactation Note (Signed)
This note was copied from a baby's chart. ?Lactation Consultation Note ? ?Patient Name: Valerie Taylor ?Today's Date: 07/06/2021 ?  ?Age:36 hours ? ? ?LC Note: ? ?Attempted to visit with mother, however, she desires a lactation consult later today.  Baby in bassinet; mother desires rest at this time.  Will have evening LC follow up. ? ? ?Maternal Data ?  ? ?Feeding ?  ? ?LATCH Score ?Latch: Grasps breast easily, tongue down, lips flanged, rhythmical sucking. ? ?Audible Swallowing: A few with stimulation ? ?Type of Nipple: Everted at rest and after stimulation ? ?Comfort (Breast/Nipple): Soft / non-tender ? ?Hold (Positioning): Assistance needed to correctly position infant at breast and maintain latch. ? ?LATCH Score: 8 ? ? ?Lactation Tools Discussed/Used ?  ? ?Interventions ?Interventions: Assisted with latch;Skin to skin;Breast compression;Adjust position ? ?Discharge ?  ? ?Consult Status ?  ? ? ? ?Mynor Witkop R Catcher Dehoyos ?07/06/2021, 5:45 PM ? ? ? ?

## 2021-07-07 ENCOUNTER — Encounter (HOSPITAL_COMMUNITY): Payer: Self-pay | Admitting: Family Medicine

## 2021-07-07 LAB — BPAM RBC
Blood Product Expiration Date: 202305012359
ISSUE DATE / TIME: 202304051440
Unit Type and Rh: 7300

## 2021-07-07 LAB — CBC
HCT: 24.7 % — ABNORMAL LOW (ref 36.0–46.0)
Hemoglobin: 7.9 g/dL — ABNORMAL LOW (ref 12.0–15.0)
MCH: 26.2 pg (ref 26.0–34.0)
MCHC: 32 g/dL (ref 30.0–36.0)
MCV: 82.1 fL (ref 80.0–100.0)
Platelets: 230 10*3/uL (ref 150–400)
RBC: 3.01 MIL/uL — ABNORMAL LOW (ref 3.87–5.11)
RDW: 14.7 % (ref 11.5–15.5)
WBC: 13.7 10*3/uL — ABNORMAL HIGH (ref 4.0–10.5)
nRBC: 0 % (ref 0.0–0.2)

## 2021-07-07 LAB — TYPE AND SCREEN
ABO/RH(D): B POS
Antibody Screen: NEGATIVE
Unit division: 0

## 2021-07-07 MED ORDER — SODIUM CHLORIDE 0.9 % IV SOLN
500.0000 mg | Freq: Once | INTRAVENOUS | Status: AC
  Administered 2021-07-07: 250 mg via INTRAVENOUS
  Filled 2021-07-07: qty 25

## 2021-07-07 NOTE — Progress Notes (Signed)
Subjective: ?Postpartum Day #1: Cesarean Delivery ?Patient reports tolerating PO and no problems voiding; breastfeeding going well; denies dizziness with ambulation; she desires a circ for her son- consent obtained and a note placed in his chart.   ? ?Objective: ?Vital signs in last 24 hours: ?Temp:  [97 ?F (36.1 ?C)-98.1 ?F (36.7 ?C)] 97.8 ?F (36.6 ?C) (04/06 0430) ?Pulse Rate:  [60-112] 71 (04/06 0430) ?Resp:  [15-26] 18 (04/06 0430) ?BP: (98-128)/(51-76) 98/64 (04/06 0430) ?SpO2:  [98 %-100 %] 98 % (04/06 0430) ? ?Physical Exam:  ?General: alert, cooperative, and no distress ?Lochia: appropriate ?Uterine Fundus: firm ?Incision: pressure dsg intact and dry ?DVT Evaluation: No evidence of DVT seen on physical exam. ? ?Recent Labs  ?  07/06/21 ?0156 07/07/21 ?7616  ?HGB 8.6* 7.9*  ?HCT 27.1* 24.7*  ? ? ?Assessment/Plan: ?Status post Cesarean section. Doing well postoperatively.  ?Continue current care. Is s/p 1u PRBCs and is agreeable to IV Venofer for asymptomatic acute blood loss anemia. Anticipate d/c 07/08/21. ? ?Arabella Merles CNM ?07/07/2021, 8:57 AM ? ? ?

## 2021-07-07 NOTE — Lactation Note (Signed)
This note was copied from a baby's chart. ?Lactation Consultation Note ? ?Patient Name: Valerie Taylor ?Today's Date: 07/07/2021 ?Reason for consult: Follow-up assessment;Late-preterm 34-36.6wks ?Age:36 hours ? ? ?P2 mother whose infant is now 47 hours old.  This is a LPTI at 36+5 weeks weighing > 6 lbs.  Mother breast fed her first child (now 87 years old) for 2 years.  Her current feeding preference is breast. ? ?Mother had no questions/concerns related to breast feeding.  She stated that her son has been latching/feeding well; no pain with breast feeding.  She did not desire to review hand expression and feels comfortable with her breast feeding ability.  Encouraged to feed at least every three hours due to gestational age and sooner if baby shows cues.  Mother has a DEBP set up at bedside but is not interested in using it.  Asked her to call for latch assistance if needed and informed her that supplementation is available if baby does not continue to feed well.  Mother verbalized understanding.   ? ?No support person present at this time. ? ? ?Maternal Data ?Has patient been taught Hand Expression?: No (Mother stated she is familar with hand expression) ?Does the patient have breastfeeding experience prior to this delivery?: Yes ?How long did the patient breastfeed?: 2 years ? ?Feeding ?Mother's Current Feeding Choice: Breast Milk ? ?LATCH Score ?  ? ?  ? ?  ? ?  ? ?  ? ?  ? ? ?Lactation Tools Discussed/Used ?  ? ?Interventions ?Interventions: Breast feeding basics reviewed;Education ? ?Discharge ?Pump: Personal ? ?Consult Status ?Consult Status: Follow-up ?Date: 07/08/21 ?Follow-up type: In-patient ? ? ? ?Cristo Ausburn R Henri Baumler ?07/07/2021, 3:08 PM ? ? ? ?

## 2021-07-07 NOTE — Progress Notes (Signed)
CSW acknowledged consult for depression and completed chart review.  CSW met with MOB in room 503.  When CSW arrived, MOB's was bonding with infant as evidence by engaging in skin to skin; FOB was asleep on the couch, and MOB's school age sister was in the recliner watching TV.  CSW explained CSW's role and discussed confidentiality. MOB gave CSW permission to speak about MOB's MH with MOB's guest present.  CSW asked about MOB's MH and MOB denied having a dx of depression.  MOB shared that she was feeling "frustrated and sad one time after a medical procedure, but I don't have depression."  MOB declined any education and resources for PMADs.  MOB shared that if she needs help she will reach out to her provider.  ? ?MOB's Edinburgh Score is 0. ? ?There are no barriers to discharge.  ? ?Vern Prestia Boyd-Gilyard, MSW, LCSW ?Clinical Social Work ?[phone] ?

## 2021-07-08 LAB — SURGICAL PATHOLOGY

## 2021-07-08 MED ORDER — ACETAMINOPHEN 500 MG PO TABS: 1000.0000 mg | ORAL_TABLET | Freq: Three times a day (TID) | ORAL | 0 refills | Status: AC | PRN

## 2021-07-08 MED ORDER — IBUPROFEN 600 MG PO TABS: 600.0000 mg | ORAL_TABLET | Freq: Four times a day (QID) | ORAL | 0 refills | Status: DC | PRN

## 2021-07-08 NOTE — Lactation Note (Signed)
This note was copied from a baby's chart. ?Lactation Consultation Note ? ?Patient Name: Valerie Taylor ?Today's Date: 07/08/2021 ?Reason for consult: Follow-up assessment;Late-preterm 34-36.6wks ?Age:36 hours ? ?LC in to visit with P2 Mom of [redacted]w[redacted]d AGA baby being discharged today.  Baby is at 6% weight loss and bilirubin at 9.3 this am.  ? ?Mom eating her lunch and baby post circumcision.  Baby noted to be swaddled in crib, sleepily sucking on a pacifier.  Shared LC view on early pacifier use and encouraged Mom to hold off until baby has regained to past his birth weight.   ? ?Encouraged more STS and offering the breast with a deep latch at least every 3 hrs.  Mom denies any problems latching baby.   ? ?Engorgement prevention and treatment reviewed.   ? ?Mom aware of OP lactation support available to her and encouraged her to call prn.  ? ? ?Lactation Tools Discussed/Used ?Tools: Pump ?Breast pump type: Double-Electric Breast Pump ?Reason for Pumping: support milk supply/ ?Pumping frequency: Mom isn't using the pump ? ?Interventions ?Interventions: Breast feeding basics reviewed;Skin to skin;Breast massage;Hand express ? ?Discharge ?Discharge Education: Engorgement and breast care;Warning signs for feeding baby;Outpatient recommendation;Outpatient Epic message sent ?Pump: Personal (Motif DEBP) ? ?Consult Status ?Consult Status: Complete ?Date: 07/08/21 ?Follow-up type: Call as needed ? ? ? ?Johny Blamer E ?07/08/2021, 11:36 AM ? ? ? ?

## 2021-07-13 ENCOUNTER — Ambulatory Visit: Payer: BC Managed Care – PPO | Admitting: Emergency Medicine

## 2021-07-13 VITALS — BP 121/84 | HR 82 | Wt 175.0 lb

## 2021-07-13 DIAGNOSIS — Z98891 History of uterine scar from previous surgery: Secondary | ICD-10-CM

## 2021-07-13 NOTE — Progress Notes (Signed)
Subjective:  ?  ? Valerie Taylor is a 36 y.o. female who presents to the clinic 1 weeks status post  C-section  for . Eating a regular diet without difficulty. Bowel movements are normal. Pain is controlled without any medications. ? ?The following portions of the patient's history were reviewed and updated as appropriate: allergies, current medications, past family history, past medical history, past social history, past surgical history, and problem list. ? ?Review of Systems ?Pertinent items are noted in HPI.  ?  ?Objective:  ? ? BP 121/84   Pulse 82   Wt 175 lb (79.4 kg)   LMP 10/22/2020   BMI 30.04 kg/m?  ?General:  alert  ?Abdomen: soft, non-tender  ?Incision:   healing well, no drainage, no erythema, no hernia, no seroma, no swelling, no dehiscence, incision well approximated  ?   ?Assessment:  ? ? Doing well postoperatively. ?  ?Plan:  ? ? 1. Continue any current medications. ?2. Wound care discussed. ?3. Activity restrictions: none ?4. Anticipated return to work:  6+ weeks . ?5. Follow up: 4 weeks for PP visit.  ? ?Barkley Boards, RN  ?

## 2021-08-04 ENCOUNTER — Ambulatory Visit (INDEPENDENT_AMBULATORY_CARE_PROVIDER_SITE_OTHER): Payer: Medicaid Other | Admitting: Obstetrics and Gynecology

## 2021-08-04 ENCOUNTER — Encounter: Payer: Self-pay | Admitting: Obstetrics and Gynecology

## 2021-08-04 NOTE — Progress Notes (Signed)
.. ? ? ?Post Partum Visit Note ? ?Valerie Taylor is a 36 y.o. [phone] female who presents for a postpartum visit. She is 4 weeks postpartum following a primary cesarean section.  I have fully reviewed the prenatal and intrapartum course. The delivery was at 36.5 gestational weeks.  Anesthesia: spinal. Postpartum course has been good. Baby is doing well. Baby is feeding by breast. Bleeding staining only. Bowel function is normal. Bladder function is normal. Patient is not sexually active. Contraception method is tubal ligation. Postpartum depression screening: negative. ? ? ?The pregnancy intention screening data noted above was reviewed. Potential methods of contraception were discussed. The patient is s/p salpingectomy. ? ? Edinburgh Postnatal Depression Scale - 08/04/21 1110   ? ?  ? Edinburgh Postnatal Depression Scale:  In the Past 7 Days  ? I have been able to laugh and see the funny side of things. 0   ? I have looked forward with enjoyment to things. 0   ? I have blamed myself unnecessarily when things went wrong. 0   ? I have been anxious or worried for no good reason. 0   ? I have felt scared or panicky for no good reason. 0   ? Things have been getting on top of me. 0   ? I have been so unhappy that I have had difficulty sleeping. 0   ? I have felt sad or miserable. 0   ? I have been so unhappy that I have been crying. 0   ? The thought of harming myself has occurred to me. 0   ? Edinburgh Postnatal Depression Scale Total 0   ? ?  ?  ? ?  ? ? ?Health Maintenance Due  ?Topic Date Due  ? COVID-19 Vaccine (1) Never done  ? ? ?The following portions of the patient's history were reviewed and updated as appropriate: allergies, current medications, past family history, past medical history, past social history, past surgical history, and problem list. ? ?Review of Systems ?Pertinent items are noted in HPI. ? ?Objective:  ?BP 119/82   Pulse 76   Wt 77.3 kg   LMP 10/22/2020   Breastfeeding Yes   BMI 29.25  kg/m?   ? ?General:  alert, cooperative, appears stated age, and no distress  ? Breasts:  not indicated  ?Lungs: clear to auscultation bilaterally  ?Heart:  regular rate and rhythm  ?Abdomen: soft, non-tender; bowel sounds normal; no masses,  no organomegaly   ?Wound well approximated incision, clean dry and intact  ?GU exam:  not indicated  ?     ?Assessment:  ? ? Encounter for postpartum care ? ?normal postpartum exam.  ? ?Plan:  ? ?Essential components of care per ACOG recommendations: ? ?1.  Mood and well being: Patient with negative depression screening today. Reviewed local resources for support.  ?- Patient tobacco use? No.   ?- hx of drug use? No.   ? ?2. Infant care and feeding:  ?-Patient currently breastmilk feeding? Yes. Reviewed importance of draining breast regularly to support lactation.  ?-Social determinants of health (SDOH) reviewed in EPIC. No concerns. ? ?3. Sexuality, contraception and birth spacing ?- Patient does not want a pregnancy in the next year.  Desired family size is 2 children.  ?- Reviewed reproductive life planning. Reviewed contraceptive methods based on pt preferences and effectiveness.  Patient desired Female Sterilization today.   ?- Discussed birth spacing of 18 months ? ?4. Sleep and fatigue ?-Encouraged family/partner/community support of 4 hrs  of uninterrupted sleep to help with mood and fatigue ? ?5. Physical Recovery  ?- Discussed patients delivery and complications. She describes her labor as good. ?- Patient had a C-section, no problems at delivery.  ?- Patient has urinary incontinence? No. ?- Patient is safe to resume physical and sexual activity ? ?6.  Health Maintenance ?- HM due items addressed Yes ?- Last pap smear  ?Diagnosis  ?Date Value Ref Range Status  ?01/11/2021   Final  ? - Negative for intraepithelial lesion or malignancy (NILM)  ? Pap smear not done at today's visit.  ?-Breast Cancer screening indicated? No.  ? ?7. Chronic Disease/Pregnancy Condition  follow up: None ? ?F/u in 1 year for annual exam ? ?Warden Fillers, MD ?Center for [employer], [facility]  ?

## 2021-12-28 ENCOUNTER — Ambulatory Visit: Payer: Medicaid Other

## 2022-01-05 ENCOUNTER — Other Ambulatory Visit (HOSPITAL_COMMUNITY)
Admission: RE | Admit: 2022-01-05 | Discharge: 2022-01-05 | Disposition: A | Discharge status: Home / Self Care | Payer: Medicaid Other | Source: Ambulatory Visit | Attending: Obstetrics and Gynecology | Admitting: Obstetrics and Gynecology

## 2022-01-05 ENCOUNTER — Ambulatory Visit (INDEPENDENT_AMBULATORY_CARE_PROVIDER_SITE_OTHER): Payer: Medicaid Other | Admitting: General Practice

## 2022-01-05 VITALS — BP 124/83 | HR 67 | Ht 64.0 in | Wt 175.2 lb

## 2022-01-05 DIAGNOSIS — N898 Other specified noninflammatory disorders of vagina: Secondary | ICD-10-CM | POA: Insufficient documentation

## 2022-01-05 MED ORDER — FLUCONAZOLE 150 MG PO TABS: 150.0000 mg | ORAL_TABLET | Freq: Once | ORAL | 0 refills | Status: AC

## 2022-01-05 MED ORDER — METRONIDAZOLE 0.75 % VA GEL: 1.0000 | Freq: Two times a day (BID) | VAGINAL | 0 refills | Status: DC

## 2022-01-05 NOTE — Progress Notes (Signed)
SUBJECTIVE:  36 y.o. female complains of white and copious vaginal discharge for 1 day(s). Denies abnormal vaginal bleeding or significant pelvic pain or fever. No UTI symptoms. Denies history of known exposure to STD.  No LMP recorded.  OBJECTIVE:  She appears well, afebrile. Urine dipstick: not done.  ASSESSMENT:  Vaginal Discharge  Vaginal Odor   PLAN:  GC, chlamydia, trichomonas, BVAG, CVAG probe sent to lab. Treatment: Pt used Metrogel she had at home. New Rx for metrogel sent and Diflucan sent per protocol. Further treatment to be determined once lab results are received ROV prn if symptoms persist or worsen.

## 2022-01-06 LAB — CERVICOVAGINAL ANCILLARY ONLY
Bacterial Vaginitis (gardnerella): POSITIVE — AB
Candida Glabrata: NEGATIVE
Candida Vaginitis: NEGATIVE
Comment: NEGATIVE
Comment: NEGATIVE
Comment: NEGATIVE

## 2022-01-09 MED ORDER — METRONIDAZOLE 500 MG PO TABS: 500.0000 mg | ORAL_TABLET | Freq: Two times a day (BID) | ORAL | 0 refills | Status: DC

## 2022-01-09 NOTE — Addendum Note (Signed)
Addended by: Mora Bellman on: 01/09/2022 08:45 AM   Modules accepted: Orders

## 2022-01-11 ENCOUNTER — Encounter: Payer: Self-pay | Admitting: Obstetrics and Gynecology

## 2022-01-12 ENCOUNTER — Other Ambulatory Visit: Payer: Self-pay | Admitting: Emergency Medicine

## 2022-01-12 MED ORDER — METRONIDAZOLE 0.75 % VA GEL: 1.0000 | Freq: Every day | VAGINAL | 1 refills | Status: DC

## 2022-01-12 NOTE — Progress Notes (Signed)
Rx for Metrog gel, unable to take oral Flagyl, due to breast feeding.

## 2022-01-15 IMAGING — US US OB COMP LESS 14 WK
1 series · 13 of 28 positions shown · non-contrast
Comparison: None.

CLINICAL DATA: Abdominal pain, first trimester of pregnancy.

EXAM:
OBSTETRIC <14 WK US AND TRANSVAGINAL OB US
TECHNIQUE: Both transabdominal and transvaginal ultrasound examinations were
performed for complete evaluation of the gestation as well as the
maternal uterus, adnexal regions, and pelvic cul-de-sac.
Transvaginal technique was performed to assess early pregnancy.

[Series 1: us ob comp less 14 wk · 101 acquisitions, 13 frames shown]
[im 4/101]
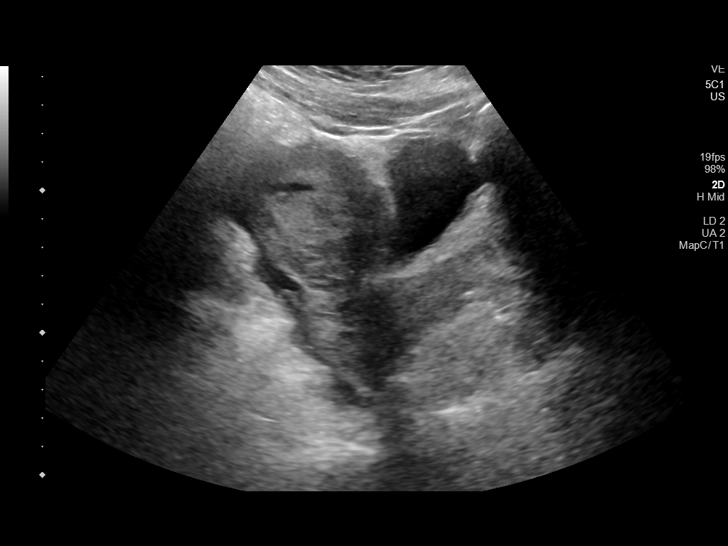
[im 12/101]
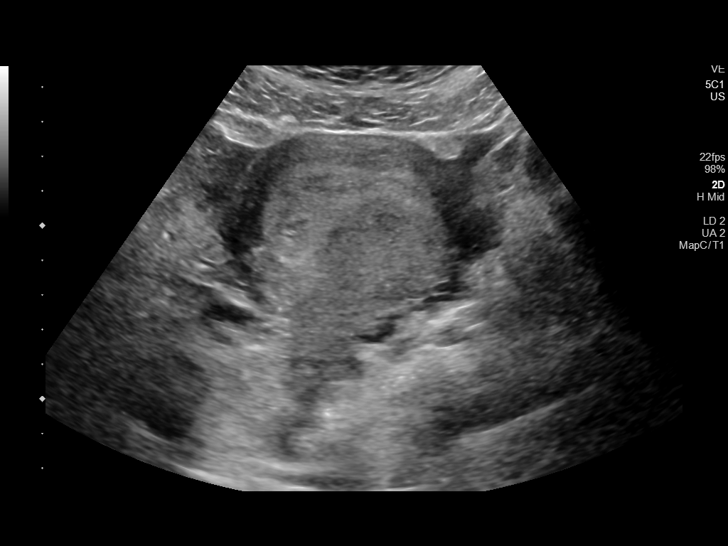
[im 19/101]
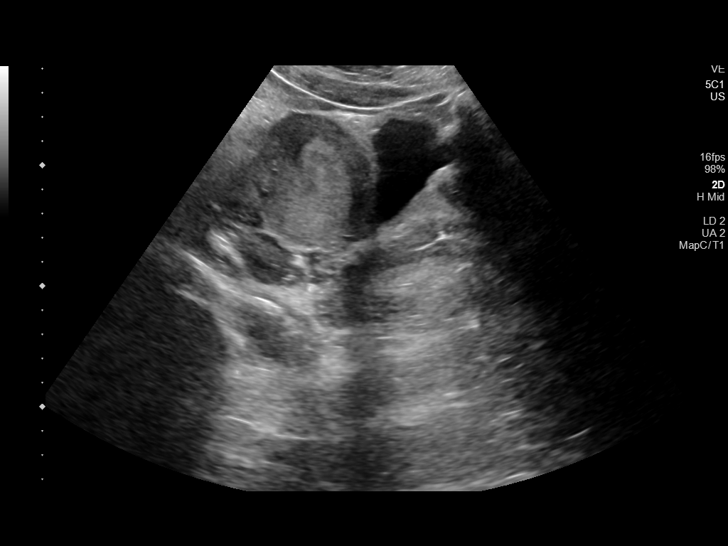
[im 26/101]
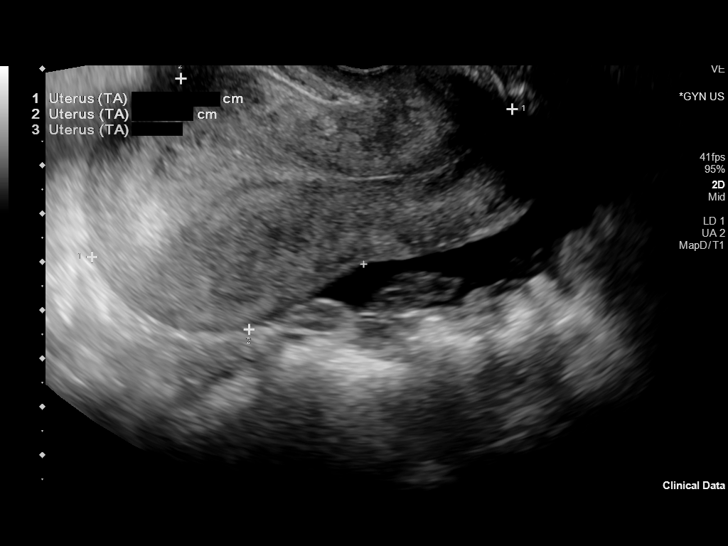
[im 34/101]
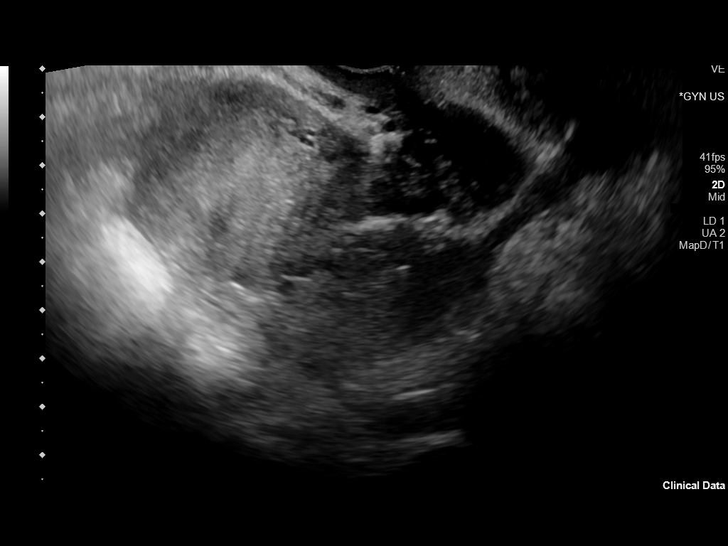
[im 41/101]
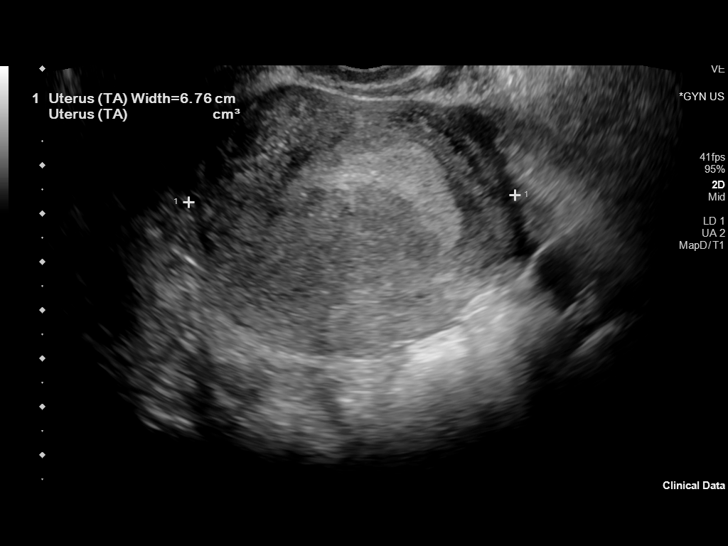
[im 52/101]
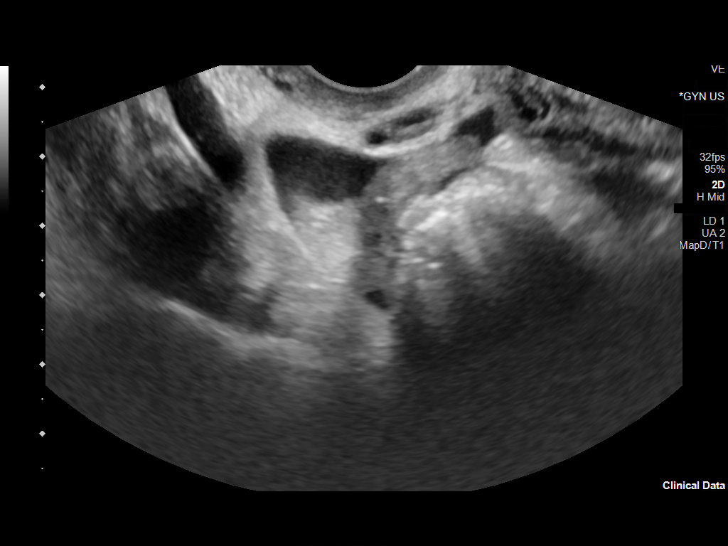
[im 60/101]
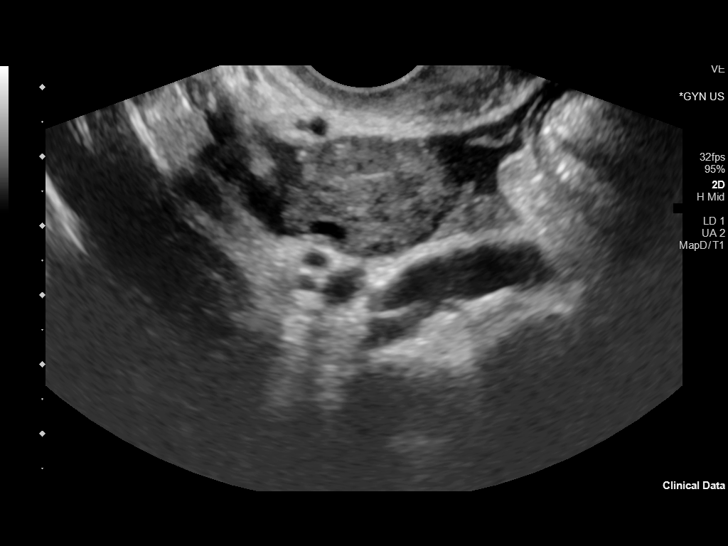
[im 67/101]
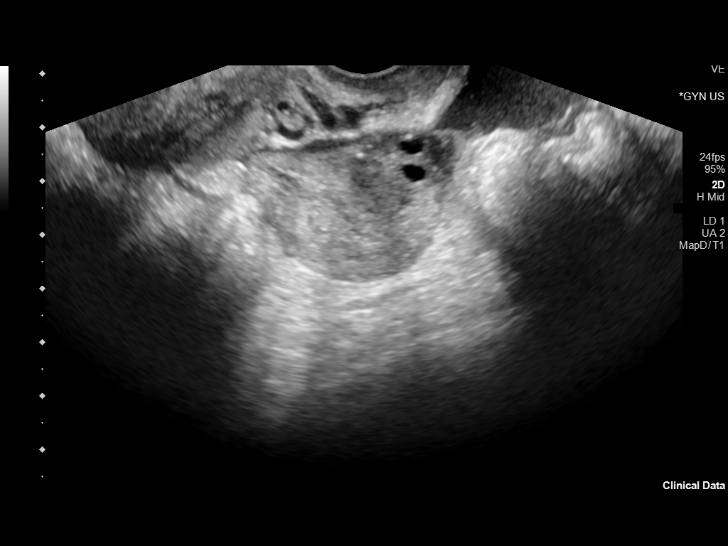
[im 75/101]
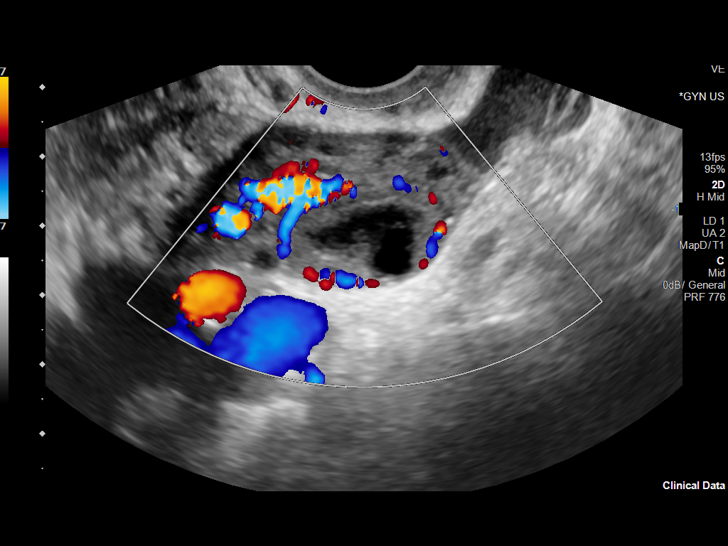
[im 82/101]
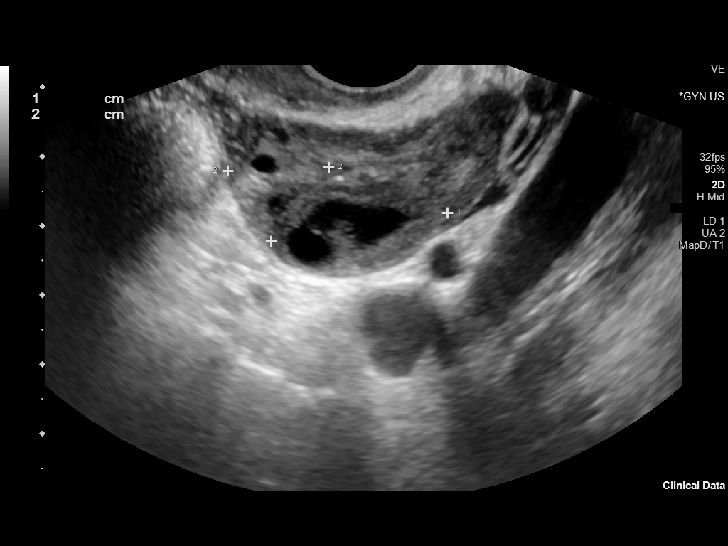
[im 89/101]
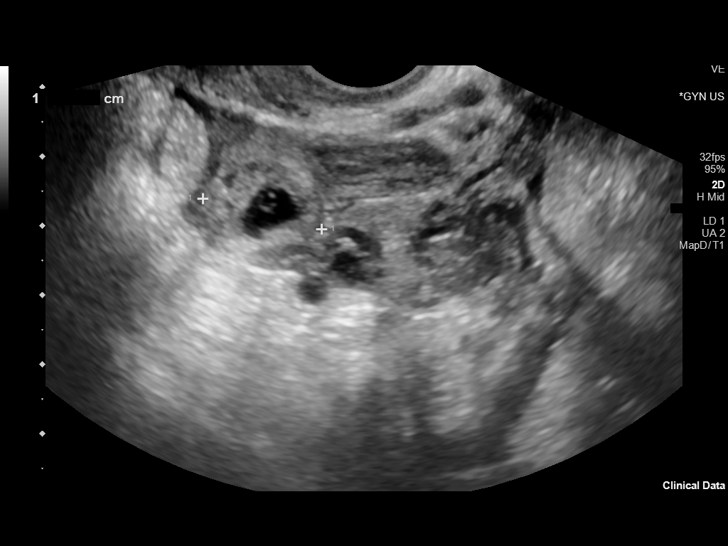
[im 97/101]
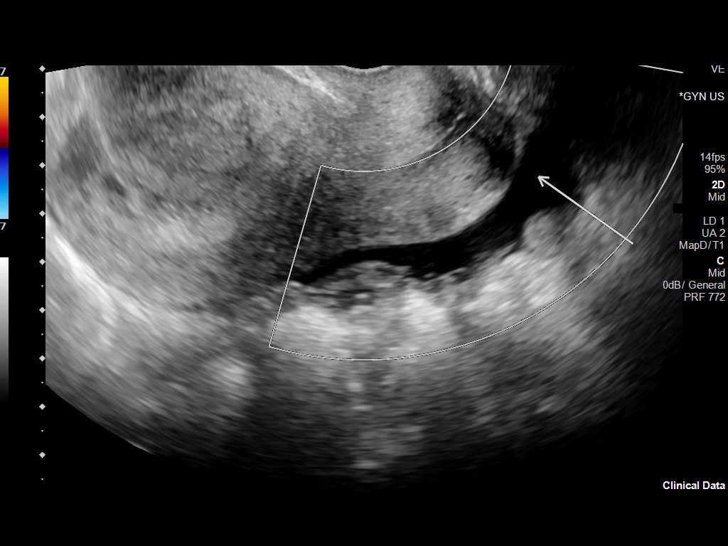

[13 of 28 positions shown; findings below may reference images not displayed]

FINDINGS: Intrauterine gestational sac: Fluid is noted within the endometrial
space, but a defined rounded fluid collection is not clearly
visualized.

Yolk sac:  Not Visualized.

Embryo:  Not Visualized.

Cardiac Activity: Not Visualized.

MSD: 10.7 mm   5 w   6 d

Subchorionic hemorrhage:  None visualized.

Maternal uterus/adnexae: Right ovary is unremarkable. Small amount
of free fluid is noted which may be physiologic. Two complex cysts
are noted within or adjacent the left ovary. One measures 2.8 cm in
diameter and demonstrates irregular internal echoes as well as some
peripheral blood flow on Doppler; it appears to be within the ovary.
Another measures 1.8 cm and demonstrates rounded smaller cyst within
it concerning for yolk sac, and this is concerning for an ectopic
pregnancy that may be immediately adjacent to the left ovary.
IMPRESSION: Findings are highly concerning for ectopic pregnancy adjacent to the
left ovary. Critical Value/emergent results were called by telephone
at the time of interpretation on 01/05/2020 at [DATE] to provider
Dr. Hauwa U, who verbally acknowledged these results.

## 2022-09-14 ENCOUNTER — Encounter: Payer: Self-pay | Admitting: Nurse Practitioner

## 2022-09-14 ENCOUNTER — Ambulatory Visit (INDEPENDENT_AMBULATORY_CARE_PROVIDER_SITE_OTHER): Payer: Medicaid Other | Admitting: Nurse Practitioner

## 2022-09-14 VITALS — BP 106/64 | HR 88 | Temp 97.9°F | Ht 64.0 in | Wt 193.4 lb

## 2022-09-14 DIAGNOSIS — R2231 Localized swelling, mass and lump, right upper limb: Secondary | ICD-10-CM

## 2022-09-14 DIAGNOSIS — G43919 Migraine, unspecified, intractable, without status migrainosus: Secondary | ICD-10-CM

## 2022-09-14 DIAGNOSIS — Z803 Family history of malignant neoplasm of breast: Secondary | ICD-10-CM | POA: Diagnosis not present

## 2022-09-14 DIAGNOSIS — E669 Obesity, unspecified: Secondary | ICD-10-CM | POA: Diagnosis not present

## 2022-09-14 NOTE — Progress Notes (Signed)
New Patient Office Visit  Subjective    Patient ID: Valerie Taylor, female    DOB: 1986/03/10  Age: 37 y.o. MRN: [id number]  CC:  Chief Complaint  Patient presents with   Rash    HPI Valerie Taylor presents to establish care Has not been treated in primary care provider office for quite some time.  Wants to reestablish care nail because she wants an overall health check.  She also wants to discuss migraines and rash/swelling of her right axilla.  Migraines: Has been having intermittent migraines since age 29.  Reports they have been 3 times a month.  Severity will vary with worse migraines resulting in nausea and vomiting.  Has tried Imitrex for abortive treatment before and does not tolerate this due to severe lethargy.  Also reports having been on another medication as prescribed by neurology in [place] that she cannot recall the name of, she does not tolerate this either.  Denies ever being on preventative medication for migraines.  Currently she uses Excedrin or Tylenol which if she treats migraine early on in its course will help manage symptoms but does not resolve migraine completely.  Rash/swelling of right axilla: Has been present for a few weeks.  Seem to occur after shaving and then use of deodorant shortly after shaving.  She does have a family history of breast cancer, maternal grandmothers sister had breast cancer and a maternal aunt had breast cancer.  She has not noted any masses to her right breast nor has she noted any nipple inversion.  She had a son about 1 year ago, has not been breast-feeding for approximately 7 to 8 months.  Reports that she has undergone tubal ligation.  Outpatient Encounter Medications as of 09/14/2022  Medication Sig   acetaminophen (TYLENOL) 500 MG tablet Take 2 tablets (1,000 mg total) by mouth every 8 (eight) hours as needed (pain).   aspirin-acetaminophen-caffeine (EXCEDRIN MIGRAINE) 250-250-65 MG tablet Take 1 tablet by mouth every 8  (eight) hours as needed for headache or migraine.   [DISCONTINUED] Blood Pressure Monitoring (BLOOD PRESSURE KIT) DEVI 1 kit by Does not apply route once a week. (Patient not taking: Reported on 01/05/2022)   [DISCONTINUED] ferrous sulfate 325 (65 FE) MG tablet Take 1 tablet (325 mg total) by mouth every other day. (Patient not taking: Reported on 07/01/2021)   [DISCONTINUED] ibuprofen (ADVIL) 600 MG tablet Take 1 tablet (600 mg total) by mouth every 6 (six) hours as needed (pain). (Patient not taking: Reported on 07/13/2021)   [DISCONTINUED] metroNIDAZOLE (FLAGYL) 500 MG tablet Take 1 tablet (500 mg total) by mouth 2 (two) times daily.   [DISCONTINUED] metroNIDAZOLE (METROGEL VAGINAL) 0.75 % vaginal gel Place 1 Applicatorful vaginally 2 (two) times daily.   [DISCONTINUED] metroNIDAZOLE (METROGEL) 0.75 % vaginal gel Place 1 Applicatorful vaginally at bedtime. Apply one applicatorful to vagina at bedtime for 5 days   [DISCONTINUED] Prenatal MV & Min w/FA-DHA (PRENATAL GUMMIES PO) Take by mouth. (Patient not taking: Reported on 07/13/2021)   No facility-administered encounter medications on file as of 09/14/2022.    Past Medical History:  Diagnosis Date   Headache    History of ectopic pregnancy 12/06/2017   History of pregnancy loss in prior pregnancy, currently pregnant 12/06/2017   4 months loss- patient unclear of events Records requested   Infection    UTI   Kidney stones    Yeast dermatitis 06/02/2020    Past Surgical History:  Procedure Laterality Date   CESAREAN SECTION N/A 07/06/2021  Procedure: CESAREAN SECTION;  Surgeon: Federico Flake, MD;  Location: [facility];  Service: Obstetrics;  Laterality: N/A;   DIAGNOSTIC LAPAROSCOPY WITH REMOVAL OF ECTOPIC PREGNANCY N/A 01/13/2020   Procedure: DIAGNOSTIC LAPAROSCOPY WITH REMOVAL OF ECTOPIC PREGNANCY AND LEFT SALPINGECTOMY;  Surgeon: Hermina Staggers, MD;  Location: [facility] OR;  Service: Gynecology;  Laterality: N/A;   TUBAL LIGATION       Family History  Problem Relation Age of Onset   Healthy Mother    Healthy Father    Asthma Sister    Hypertension Maternal Grandmother    Alzheimer's disease Maternal Grandmother    Cancer Maternal Aunt        breast    Social History   Socioeconomic History   Marital status: Single    Spouse name: Not on file   Number of children: Not on file   Years of education: Not on file   Highest education level: Not on file  Occupational History   Not on file  Tobacco Use   Smoking status: Never   Smokeless tobacco: Never  Vaping Use   Vaping Use: Never used  Substance and Sexual Activity   Alcohol use: Never   Drug use: Never   Sexual activity: Not Currently    Partners: Male    Birth control/protection: Surgical  Other Topics Concern   Not on file  Social History Narrative   Not on file   Social Determinants of Health   Financial Resource Strain: Not on file  Food Insecurity: Not on file  Transportation Needs: Not on file  Physical Activity: Not on file  Stress: Not on file  Social Connections: Not on file  Intimate Partner Violence: Not on file    Review of Systems  Constitutional:  Negative for chills and fever.  Respiratory:  Negative for shortness of breath.   Cardiovascular:  Negative for chest pain.  Gastrointestinal:  Positive for nausea.  Neurological:  Positive for headaches.        Objective    BP 106/64   Pulse 88   Temp 97.9 F (36.6 C) (Temporal)   Ht 5\' 4"  (1.626 m)   Wt 193 lb 6 oz (87.7 kg)   LMP 08/29/2022   SpO2 99%   BMI 33.19 kg/m   Physical Exam Vitals reviewed.  Constitutional:      General: She is not in acute distress.    Appearance: Normal appearance.  HENT:     Head: Normocephalic and atraumatic.  Neck:     Vascular: No carotid bruit.  Cardiovascular:     Rate and Rhythm: Normal rate and regular rhythm.     Pulses: Normal pulses.     Heart sounds: Normal heart sounds.  Pulmonary:     Effort: Pulmonary  effort is normal.     Breath sounds: Normal breath sounds.  Chest:  Breasts:    Breasts are symmetrical.     Right: No swelling, mass, nipple discharge or skin change.     Left: No swelling, mass, nipple discharge or skin change.    Lymphadenopathy:     Upper Body:     Right upper body: Axillary adenopathy present.  Skin:    General: Skin is warm and dry.  Neurological:     General: No focal deficit present.     Mental Status: She is alert and oriented to person, place, and time.  Psychiatric:        Mood and Affect: Mood normal.  Behavior: Behavior normal.        Judgment: Judgment normal.         Assessment & Plan:   Problem List Items Addressed This Visit       Cardiovascular and Mediastinum   Intractable migraine without status migrainosus - Primary    Chronic, intermittent For now continue Excedrin and Tylenol as needed Labs ordered today, based on results may consider trial of Nurtec for abortive therapy Patient has not tolerated Imitrex in the past.      Relevant Medications   aspirin-acetaminophen-caffeine (EXCEDRIN MIGRAINE) 250-250-65 MG tablet     Other   Family history of breast cancer    Referral to genetic counseling made today      Relevant Orders   Genetic counseling   CBC   Comprehensive metabolic panel   Hemoglobin A1c   Lipid panel   TSH   US BREAST COMPLETE UNI RIGHT INC AXILLA   MM 3D DIAGNOSTIC MAMMOGRAM UNILATERAL RIGHT BREAST   Obesity without serious comorbidity    Chronic Labs ordered for further evaluation, further recommendations may be made based upon his results.      Relevant Orders   CBC   Comprehensive metabolic panel   Hemoglobin A1c   Lipid panel   TSH   Mass of right axilla    Acute No superficial skin changes to make me think this could be folliculitis.  Patient reports being afebrile as well as denying any chills.  Concern for possible abscess that is resolving versus lymphadenopathy versus mass.  Will  order ultrasound and mammogram for further evaluation.       Return in about 1 month (around 10/14/2022) for CPE with Cassie Henkels.   Elenore Paddy, NP

## 2022-09-14 NOTE — Assessment & Plan Note (Signed)
Acute No superficial skin changes to make me think this could be folliculitis.  Patient reports being afebrile as well as denying any chills.  Concern for possible abscess that is resolving versus lymphadenopathy versus mass.  Will order ultrasound and mammogram for further evaluation.

## 2022-09-14 NOTE — Assessment & Plan Note (Signed)
Chronic, intermittent For now continue Excedrin and Tylenol as needed Labs ordered today, based on results may consider trial of Nurtec for abortive therapy Patient has not tolerated Imitrex in the past.

## 2022-09-14 NOTE — Assessment & Plan Note (Signed)
Chronic Labs ordered for further evaluation, further recommendations may be made based upon his results.

## 2022-09-14 NOTE — Assessment & Plan Note (Signed)
Referral to genetic counseling made today

## 2022-09-15 LAB — COMPREHENSIVE METABOLIC PANEL
ALT: 12 U/L (ref 0–35)
AST: 18 U/L (ref 0–37)
Albumin: 4.5 g/dL (ref 3.5–5.2)
Alkaline Phosphatase: 72 U/L (ref 39–117)
BUN: 18 mg/dL (ref 6–23)
CO2: 27 mEq/L (ref 19–32)
Calcium: 9.8 mg/dL (ref 8.4–10.5)
Chloride: 99 mEq/L (ref 96–112)
Creatinine, Ser: 0.8 mg/dL (ref 0.40–1.20)
GFR: 94.55 mL/min (ref >60.00)
Glucose, Bld: 79 mg/dL (ref 70–99)
Potassium: 4.6 mEq/L (ref 3.5–5.1)
Sodium: 134 mEq/L — ABNORMAL LOW (ref 135–145)
Total Bilirubin: 0.2 mg/dL (ref 0.2–1.2)
Total Protein: 8.1 g/dL (ref 6.0–8.3)

## 2022-09-15 LAB — LIPID PANEL
Cholesterol: 227 mg/dL — ABNORMAL HIGH (ref 0–200)
HDL: 74 mg/dL (ref >39.00)
LDL Cholesterol: 122 mg/dL — ABNORMAL HIGH (ref 0–99)
NonHDL: 153.06
Total CHOL/HDL Ratio: 3
Triglycerides: 156 mg/dL — ABNORMAL HIGH (ref 0.0–149.0)
VLDL: 31.2 mg/dL (ref 0.0–40.0)

## 2022-09-15 LAB — CBC
HCT: 39 % (ref 36.0–46.0)
Hemoglobin: 12.7 g/dL (ref 12.0–15.0)
MCHC: 32.6 g/dL (ref 30.0–36.0)
MCV: 92.4 fl (ref 78.0–100.0)
Platelets: 363 10*3/uL (ref 150.0–400.0)
RBC: 4.22 Mil/uL (ref 3.87–5.11)
RDW: 14.2 % (ref 11.5–15.5)
WBC: 7.1 10*3/uL (ref 4.0–10.5)

## 2022-09-15 LAB — HEMOGLOBIN A1C: Hgb A1c MFr Bld: 5.4 % (ref 4.6–6.5)

## 2022-09-15 LAB — TSH: TSH: 1.63 u[IU]/mL (ref 0.35–5.50)

## 2022-09-18 ENCOUNTER — Encounter: Payer: Self-pay | Admitting: Nurse Practitioner

## 2022-09-18 ENCOUNTER — Other Ambulatory Visit: Payer: Self-pay | Admitting: Nurse Practitioner

## 2022-09-18 DIAGNOSIS — E871 Hypo-osmolality and hyponatremia: Secondary | ICD-10-CM

## 2022-09-18 NOTE — Progress Notes (Signed)
Please call patient and let her know that her labs show high cholesterol, slightly low sodium, normal thyroid screening, no diabetes or prediabetes, and no anemia. She should focus on healthy diet based on vegetables, fruits, lean proteins and try to exercise regularly with a goal of 150 minutes/week.   I would like to recheck the sodium level in about 1-2 weeks so please have her come back for labs again. I will place the order.   Additionally, if she would like to try nurtec to see if this may help treat her migraines better than what she is currently taking she can come to the office and be give one sample of nurtec. To use nurtec, she should place 1 tablet under he tongue and let it dissolve once a day as needed at first sign of migraine. She needs to be counseled to take a maximum of one tablet once a day only as needed.

## 2022-09-27 ENCOUNTER — Other Ambulatory Visit: Payer: Self-pay | Admitting: Nurse Practitioner

## 2022-09-27 DIAGNOSIS — N6310 Unspecified lump in the right breast, unspecified quadrant: Secondary | ICD-10-CM

## 2022-09-27 DIAGNOSIS — Z803 Family history of malignant neoplasm of breast: Secondary | ICD-10-CM

## 2022-10-18 ENCOUNTER — Ambulatory Visit
Admission: RE | Admit: 2022-10-18 | Discharge: 2022-10-18 | Disposition: A | Discharge status: Home / Self Care | Payer: Medicaid Other | Source: Ambulatory Visit | Attending: Nurse Practitioner | Admitting: Nurse Practitioner

## 2022-10-18 ENCOUNTER — Other Ambulatory Visit: Payer: Self-pay | Admitting: Nurse Practitioner

## 2022-10-18 DIAGNOSIS — Z803 Family history of malignant neoplasm of breast: Secondary | ICD-10-CM

## 2022-10-18 DIAGNOSIS — N6310 Unspecified lump in the right breast, unspecified quadrant: Secondary | ICD-10-CM

## 2022-11-02 ENCOUNTER — Ambulatory Visit (INDEPENDENT_AMBULATORY_CARE_PROVIDER_SITE_OTHER): Payer: Medicaid Other | Admitting: Nurse Practitioner

## 2022-11-02 ENCOUNTER — Ambulatory Visit: Payer: Medicaid Other | Admitting: Nurse Practitioner

## 2022-11-02 ENCOUNTER — Telehealth: Payer: Self-pay | Admitting: Nurse Practitioner

## 2022-11-02 VITALS — BP 102/60 | HR 82 | Temp 98.2°F | Ht 64.0 in | Wt 197.1 lb

## 2022-11-02 DIAGNOSIS — G43919 Migraine, unspecified, intractable, without status migrainosus: Secondary | ICD-10-CM

## 2022-11-02 DIAGNOSIS — E871 Hypo-osmolality and hyponatremia: Secondary | ICD-10-CM | POA: Diagnosis not present

## 2022-11-02 DIAGNOSIS — E785 Hyperlipidemia, unspecified: Secondary | ICD-10-CM | POA: Diagnosis not present

## 2022-11-02 LAB — BASIC METABOLIC PANEL
BUN: 10 mg/dL (ref 6–23)
CO2: 25 mEq/L (ref 19–32)
Calcium: 9.4 mg/dL (ref 8.4–10.5)
Chloride: 101 mEq/L (ref 96–112)
Creatinine, Ser: 0.7 mg/dL (ref 0.40–1.20)
GFR: 110.87 mL/min (ref >60.00)
Glucose, Bld: 74 mg/dL (ref 70–99)
Potassium: 3.7 mEq/L (ref 3.5–5.1)
Sodium: 134 mEq/L — ABNORMAL LOW (ref 135–145)

## 2022-11-02 MED ORDER — NURTEC 75 MG PO TBDP
75.0000 mg | ORAL_TABLET | ORAL | 0 refills | Status: AC | PRN
Start: 2022-11-02 — End: ?

## 2022-11-02 NOTE — Assessment & Plan Note (Signed)
Incidental finding Recheck metabolic panel, further recommendations may be made based upon his results.

## 2022-11-02 NOTE — Assessment & Plan Note (Signed)
Chronic, intermittent Unfortunately we do not have any Nurtec samples in the office today I will send prescription and see if prior authorization can get prior authorization for use of Nurtec as abortive treatment for migraine.

## 2022-11-02 NOTE — Progress Notes (Signed)
Complete physical exam  Patient: Valerie Taylor   DOB: 22-May-1985   37 y.o. Female  MRN: [id number]  Subjective:    Chief Complaint  Patient presents with   Migraine    Valerie Taylor is a 37 y.o. female who presents today for a complete physical exam. She reports consuming a general diet.  Walk/cycle/light weight lifting 3x/week for 30-60 minutes per session.  She generally feels well. She reports sleeping fairly well. She does  have additional problems to discuss today.   Migraine: Is to have about 1 migraine per month.  Has tried and failed Imitrex due to side effects in the past.  Has not yet tried a sample of Nurtec.  No longer breast-feeding.  Hyperlipidemia: Incidental finding on last labs, last LDL 122.  Since last office visit patient reports she is increased her physical activity as well as trying to increase intake of leafy greens.  Hyponatremia: Incidental finding last labs.  Patient does have migraines, however no change in their pattern has been identified.  Thus I believe this is an asymptomatic incidental finding.  Most recent fall risk assessment:    11/02/2022    1:43 PM  Fall Risk   Falls in the past year? 0  Number falls in past yr: 0  Injury with Fall? 0  Risk for fall due to : No Fall Risks  Follow up Falls evaluation completed     Most recent depression screenings:    11/02/2022    1:44 PM 09/14/2022    1:48 PM  PHQ 2/9 Scores  PHQ - 2 Score 0 0    Vision:Not within last year  and Dental: Current dental problems and Receives regular dental care  Past Medical History:  Diagnosis Date   Headache    History of ectopic pregnancy 12/06/2017   History of pregnancy loss in prior pregnancy, currently pregnant 12/06/2017   4 months loss- patient unclear of events Records requested   Infection    UTI   Kidney stones    Yeast dermatitis 06/02/2020   Past Surgical History:  Procedure Laterality Date   CESAREAN SECTION N/A 07/06/2021   Procedure: CESAREAN  SECTION;  Surgeon: Federico Flake, MD;  Location: [facility];  Service: Obstetrics;  Laterality: N/A;   DIAGNOSTIC LAPAROSCOPY WITH REMOVAL OF ECTOPIC PREGNANCY N/A 01/13/2020   Procedure: DIAGNOSTIC LAPAROSCOPY WITH REMOVAL OF ECTOPIC PREGNANCY AND LEFT SALPINGECTOMY;  Surgeon: Hermina Staggers, MD;  Location: [facility] OR;  Service: Gynecology;  Laterality: N/A;   TUBAL LIGATION     Social History   Socioeconomic History   Marital status: Single    Spouse name: Not on file   Number of children: Not on file   Years of education: Not on file   Highest education level: Not on file  Occupational History   Not on file  Tobacco Use   Smoking status: Never   Smokeless tobacco: Never  Vaping Use   Vaping status: Never Used  Substance and Sexual Activity   Alcohol use: Never   Drug use: Never   Sexual activity: Not Currently    Partners: Male    Birth control/protection: Surgical  Other Topics Concern   Not on file  Social History Narrative   Not on file   Social Determinants of Health   Financial Resource Strain: Not on file  Food Insecurity: Not on file  Transportation Needs: Not on file  Physical Activity: Not on file  Stress: Not on file  Social Connections:  Unknown (08/04/2021)   [facility]   Social Network    Social Network: Not on file  Intimate Partner Violence: Unknown (07/06/2021)   Received from [facility]   HITS    Physically Hurt: Not on file    Insult or Talk Down To: Not on file    Threaten Physical Harm: Not on file    Scream or Curse: Not on file   Family History  Problem Relation Age of Onset   Healthy Mother    Healthy Father    Asthma Sister    Breast cancer Maternal Aunt    Cancer Maternal Aunt        breast   Hypertension Maternal Grandmother    Alzheimer's disease Maternal Grandmother    Allergies  Allergen Reactions   Latex     "Only when inserted in vagina", I.e. condoms, ? Spermicide or lubricant.  Causes vaginal  swelling  OK with Latex gloves      Patient Care Team: Elenore Paddy, NP as PCP - General (Nurse Practitioner) Jodelle Red, MD as PCP - Cardiology (Cardiology)   Outpatient Medications Prior to Visit  Medication Sig   acetaminophen (TYLENOL) 500 MG tablet Take 2 tablets (1,000 mg total) by mouth every 8 (eight) hours as needed (pain).   aspirin-acetaminophen-caffeine (EXCEDRIN MIGRAINE) 250-250-65 MG tablet Take 1 tablet by mouth every 8 (eight) hours as needed for headache or migraine.   No facility-administered medications prior to visit.    Review of Systems  Constitutional:  Negative for fever, malaise/fatigue and weight loss.  Eyes:  Negative for blurred vision and double vision.  Respiratory:  Negative for cough, shortness of breath and wheezing.   Cardiovascular:  Negative for chest pain and palpitations.  Gastrointestinal:  Negative for abdominal pain and blood in stool.  Genitourinary:  Negative for hematuria.  Skin:  Negative for itching and rash.  Neurological:  Negative for seizures and loss of consciousness.  Psychiatric/Behavioral:  Negative for depression and suicidal ideas. The patient is not nervous/anxious.           Objective:     BP 102/60   Pulse 82   Temp 98.2 F (36.8 C) (Oral)   Ht 5\' 4"  (1.626 m)   Wt 197 lb 2 oz (89.4 kg)   LMP 10/19/2022 (Exact Date)   Breastfeeding No   BMI 33.84 kg/m  BP Readings from Last 3 Encounters:  11/02/22 102/60  09/14/22 106/64  01/05/22 124/83   Wt Readings from Last 3 Encounters:  11/02/22 197 lb 2 oz (89.4 kg)  09/14/22 193 lb 6 oz (87.7 kg)  01/05/22 175 lb 3.2 oz (79.5 kg)      Physical Exam Vitals reviewed.  Constitutional:      Appearance: Normal appearance.  HENT:     Head: Normocephalic and atraumatic.     Right Ear: Tympanic membrane, ear canal and external ear normal.     Left Ear: Tympanic membrane, ear canal and external ear normal.  Eyes:     General:        Right eye:  No discharge.        Left eye: No discharge.     Extraocular Movements: Extraocular movements intact.     Conjunctiva/sclera: Conjunctivae normal.     Pupils: Pupils are equal, round, and reactive to light.  Neck:     Vascular: No carotid bruit.  Cardiovascular:     Rate and Rhythm: Normal rate and regular rhythm.  Pulses: Normal pulses.     Heart sounds: Normal heart sounds. No murmur heard. Pulmonary:     Effort: Pulmonary effort is normal.     Breath sounds: Normal breath sounds.  Chest:     Comments: Deferred per patient preference Abdominal:     General: Abdomen is flat. Bowel sounds are normal. There is no distension.     Palpations: Abdomen is soft. There is no mass.     Tenderness: There is no abdominal tenderness.  Musculoskeletal:        General: No tenderness.     Cervical back: Neck supple. No muscular tenderness.     Right lower leg: No edema.     Left lower leg: No edema.  Lymphadenopathy:     Cervical: No cervical adenopathy.  Skin:    General: Skin is warm and dry.  Neurological:     General: No focal deficit present.     Mental Status: She is alert and oriented to person, place, and time.     Motor: No weakness.     Gait: Gait normal.  Psychiatric:        Mood and Affect: Mood normal.        Behavior: Behavior normal.        Judgment: Judgment normal.      No results found for any visits on 11/02/22.     Assessment & Plan:    Routine Health Maintenance and Physical Exam  Immunization History  Administered Date(s) Administered   HPV 9-valent 12/06/2018   PPD Test 01/28/2019   Tdap 04/04/2018, 05/03/2021    Health Maintenance  Topic Date Due   HPV VACCINES (2 - 3-dose SCDM series) 01/03/2019   COVID-19 Vaccine (1 - 2023-24 season) Never done   INFLUENZA VACCINE  11/02/2022   PAP SMEAR-Modifier  01/12/2024   DTaP/Tdap/Td (3 - Td or Tdap) 05/04/2031   Hepatitis C Screening  Completed   HIV Screening  Completed    Discussed health  benefits of physical activity, and encouraged her to engage in regular exercise appropriate for her age and condition.  Problem List Items Addressed This Visit       Cardiovascular and Mediastinum   Intractable migraine without status migrainosus - Primary    Chronic, intermittent Unfortunately we do not have any Nurtec samples in the office today I will send prescription and see if prior authorization can get prior authorization for use of Nurtec as abortive treatment for migraine.      Relevant Medications   Rimegepant Sulfate (NURTEC) 75 MG TBDP     Other   Hyponatremia    Incidental finding Recheck metabolic panel, further recommendations may be made based upon his results.      Relevant Orders   Basic metabolic panel   HLD (hyperlipidemia)    Chronic Encourage lifestyle modification specifically focus on Mediterranean diet and avoidance of processed carbohydrates as well as fried foods.  Patient encouraged to continue exercising regularly.      Return in about 1 year (around 11/02/2023) for CPE with Blakely Maranan.     Elenore Paddy, NP

## 2022-11-02 NOTE — Assessment & Plan Note (Signed)
Chronic Encourage lifestyle modification specifically focus on Mediterranean diet and avoidance of processed carbohydrates as well as fried foods.  Patient encouraged to continue exercising regularly.

## 2022-11-02 NOTE — Telephone Encounter (Signed)
Please asked prior authorization team to initiate prior authorization for Nurtec for patient to use as needed for abortive treatment of migraine.  She has tried and failed Imitrex in the past due to negative side effects.

## 2022-11-03 ENCOUNTER — Other Ambulatory Visit: Payer: Self-pay | Admitting: Nurse Practitioner

## 2022-11-03 ENCOUNTER — Telehealth: Payer: Medicaid Other

## 2022-11-03 ENCOUNTER — Other Ambulatory Visit (HOSPITAL_COMMUNITY): Payer: Self-pay

## 2022-11-03 DIAGNOSIS — E871 Hypo-osmolality and hyponatremia: Secondary | ICD-10-CM

## 2022-11-03 NOTE — Telephone Encounter (Signed)
PA request has been Submitted. New Encounter created for follow up. For additional info see Pharmacy Prior Auth telephone encounter from 11/03/22.

## 2022-11-03 NOTE — Telephone Encounter (Signed)
Pharmacy Patient Advocate Encounter   Received notification from Pt Calls Messages that prior authorization for Nurtec 75MG  dispersible tablets is required/requested.   Insurance verification completed.   The patient is insured through [facility] .   Per test claim: PA required; PA submitted to [facility] via CoverMyMeds Key/confirmation #/EOC [id number] Status is pending

## 2022-11-03 NOTE — Telephone Encounter (Signed)
Noted../lmb 

## 2022-11-04 ENCOUNTER — Other Ambulatory Visit: Payer: Self-pay | Admitting: Nurse Practitioner

## 2022-11-04 DIAGNOSIS — G43919 Migraine, unspecified, intractable, without status migrainosus: Secondary | ICD-10-CM

## 2022-11-06 NOTE — Telephone Encounter (Signed)
Pharmacy Patient Advocate Encounter  Received notification from [facility] that Prior Authorization for Nurtec 75MG  dispersible tablets has been DENIED. Please advise how you'd like to proceed. Full denial letter will be uploaded to the media tab. See denial reason below.  PA #/Case ID/Reference #: [id number]    Denial Reason: This drug is covered if you meet the following: You have tried one preferred triptan: rizatriptan (tablet, orally disintegrating tablet).

## 2022-11-06 NOTE — Telephone Encounter (Signed)
PA was denied for Nurtec 75 mg. Alternative are one preferred triptan: rizatriptan (tablet, orally disintegrating tablet)...Raechel Chute

## 2022-11-09 NOTE — Telephone Encounter (Signed)
It was noted that patient has tried and failed Sumatriptan in the PA request. The PA did ask if patient has tried and failed or has a contraindication to at least two preferred triptans.

## 2022-11-16 NOTE — Telephone Encounter (Addendum)
My chart message is send to pt in regards to nurtec being denied and to try an alternative. Also to let us be aware and schedule for a visit with PCP

## 2023-08-28 ENCOUNTER — Telehealth: Admitting: Family Medicine

## 2023-08-28 DIAGNOSIS — M542 Cervicalgia: Secondary | ICD-10-CM

## 2023-08-28 NOTE — Progress Notes (Signed)
 [place]   Reports neck pain that usually progresses to migraine, would like to be seen for it. Has made a PCP appt for 6/6, just was not sure if would be seen this was/virtually. Not having pain or migraine today.   Advised to keep in person appt for assessment of cervical spine/changes in the neck.   Patient acknowledged agreement and understanding of the plan.

## 2023-09-07 ENCOUNTER — Ambulatory Visit: Admitting: Nurse Practitioner

## 2023-09-07 ENCOUNTER — Ambulatory Visit: Payer: Self-pay | Admitting: Internal Medicine

## 2023-11-02 ENCOUNTER — Encounter: Payer: Medicaid Other | Admitting: Nurse Practitioner

## 2023-11-06 ENCOUNTER — Ambulatory Visit: Payer: Self-pay

## 2023-11-06 NOTE — Telephone Encounter (Signed)
 Pt calling stating that she has new insurance and is wondering if the HA medication that medicaid denied would now be covered with her new ins. Pt would like to try the medication if possible. Pt does not remember the name of the medication. States that you normally carry samples in the clinic but did not have the medication samples the last time she was in for an appt.   Copied from CRM #[id number]. Topic: Clinical - Red Word Triage >> Nov 06, 2023 10:11 AM Tiffini S wrote: [facility] that prompted transfer to Nurse Triage: Patient have a appointment scheduled for 12/06/23- have been having headaches daily and is not feeling well. Transferred call to triage nurse.

## 2023-12-06 ENCOUNTER — Encounter: Payer: Self-pay | Admitting: Nurse Practitioner

## 2024-01-22 ENCOUNTER — Ambulatory Visit

## 2024-01-24 ENCOUNTER — Ambulatory Visit
